# Patient Record
Sex: Male | Born: 1950
Health system: Southern US, Community
[De-identification: ages and names within clinical notes are randomized; demographics above are authoritative.]

## PROBLEM LIST (undated history)

## (undated) DIAGNOSIS — F32A Depression, unspecified: Secondary | ICD-10-CM

## (undated) DIAGNOSIS — F329 Major depressive disorder, single episode, unspecified: Secondary | ICD-10-CM

## (undated) DIAGNOSIS — E785 Hyperlipidemia, unspecified: Secondary | ICD-10-CM

## (undated) DIAGNOSIS — G4700 Insomnia, unspecified: Secondary | ICD-10-CM

## (undated) DIAGNOSIS — K219 Gastro-esophageal reflux disease without esophagitis: Secondary | ICD-10-CM

## (undated) DIAGNOSIS — I10 Essential (primary) hypertension: Secondary | ICD-10-CM

## (undated) DIAGNOSIS — I639 Cerebral infarction, unspecified: Secondary | ICD-10-CM

## (undated) HISTORY — DX: Essential (primary) hypertension: I10

## (undated) HISTORY — DX: Major depressive disorder, single episode, unspecified: F32.9

## (undated) HISTORY — DX: Hyperlipidemia, unspecified: E78.5

## (undated) HISTORY — DX: Gastro-esophageal reflux disease without esophagitis: K21.9

## (undated) HISTORY — PX: LEG SURGERY: SHX1003

## (undated) HISTORY — DX: Depression, unspecified: F32.A

## (undated) HISTORY — DX: Insomnia, unspecified: G47.00

## (undated) HISTORY — DX: Cerebral infarction, unspecified: I63.9

## (undated) HISTORY — PX: OTHER SURGICAL HISTORY: SHX169

---

## 1998-10-05 ENCOUNTER — Ambulatory Visit (HOSPITAL_COMMUNITY): Admission: RE | Admit: 1998-10-05 | Discharge: 1998-10-05 | Payer: Self-pay | Admitting: Internal Medicine

## 1998-10-07 ENCOUNTER — Ambulatory Visit (HOSPITAL_COMMUNITY): Admission: RE | Admit: 1998-10-07 | Discharge: 1998-10-07 | Payer: Self-pay | Admitting: Family Medicine

## 2000-04-02 ENCOUNTER — Ambulatory Visit (HOSPITAL_COMMUNITY): Admission: RE | Admit: 2000-04-02 | Discharge: 2000-04-02 | Payer: Self-pay | Admitting: Internal Medicine

## 2000-06-17 ENCOUNTER — Encounter: Payer: Self-pay | Admitting: Occupational Medicine

## 2000-06-17 ENCOUNTER — Ambulatory Visit (HOSPITAL_COMMUNITY): Admission: RE | Admit: 2000-06-17 | Discharge: 2000-06-17 | Payer: Self-pay | Admitting: Occupational Medicine

## 2003-04-15 ENCOUNTER — Encounter: Admission: RE | Admit: 2003-04-15 | Discharge: 2003-04-15 | Payer: Self-pay | Admitting: Internal Medicine

## 2003-04-15 ENCOUNTER — Encounter: Payer: Self-pay | Admitting: Internal Medicine

## 2003-11-08 ENCOUNTER — Encounter: Admission: RE | Admit: 2003-11-08 | Discharge: 2003-11-08 | Payer: Self-pay | Admitting: Internal Medicine

## 2003-12-09 ENCOUNTER — Ambulatory Visit (HOSPITAL_COMMUNITY): Admission: RE | Admit: 2003-12-09 | Discharge: 2003-12-09 | Payer: Self-pay | Admitting: Internal Medicine

## 2003-12-21 ENCOUNTER — Encounter: Admission: RE | Admit: 2003-12-21 | Discharge: 2003-12-21 | Payer: Self-pay

## 2004-03-30 ENCOUNTER — Encounter: Admission: RE | Admit: 2004-03-30 | Discharge: 2004-03-30 | Payer: Self-pay | Admitting: Internal Medicine

## 2004-04-03 ENCOUNTER — Encounter: Admission: RE | Admit: 2004-04-03 | Discharge: 2004-04-03 | Payer: Self-pay | Admitting: Internal Medicine

## 2004-05-02 ENCOUNTER — Ambulatory Visit (HOSPITAL_COMMUNITY): Admission: RE | Admit: 2004-05-02 | Discharge: 2004-05-02 | Payer: Self-pay | Admitting: *Deleted

## 2004-05-02 ENCOUNTER — Encounter (INDEPENDENT_AMBULATORY_CARE_PROVIDER_SITE_OTHER): Payer: Self-pay | Admitting: Specialist

## 2004-10-25 IMAGING — CT CT PELVIS W/ CM
1 series · 16 of 32 positions shown, 20 images · IV contrast (G+ & 50 MLOMNI 300)
Comparison: none

CLINICAL DATA: Pain in the left femoral canal region.
 CT PELVIS WITH CONTRAST
 Multidetector helical scans through the pelvis were performed after oral and IV contrast media were given.  50 cc of Omnipaque 300 were given as the contrast media.
 The ureters are normal in caliber.  No ureteral calculi are seen.  The urinary bladder is urine distended and unremarkable.  The prostate is minimally prominent with a prostatic calculus present. There is no evidence of hernia and no groin mass is seen.
 IMPRESSION
 Negative CT of the pelvis.

[Series 2: routine pelvis · axial · 0.70mm/px · z∈[-275,-45]mm · 16 of 87 slices shown, 20 images]
[im 6/87  soft-tissue]
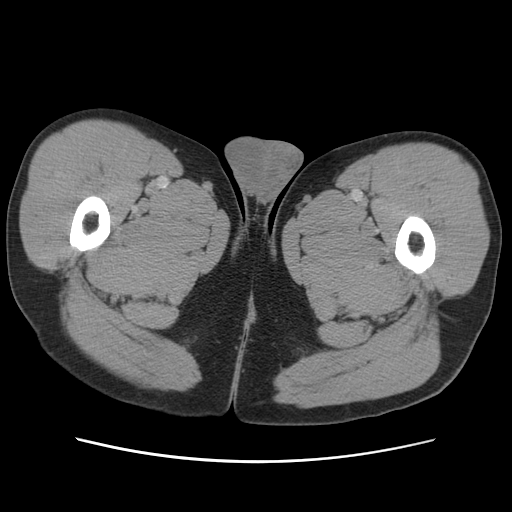
[im 6/87  bone]
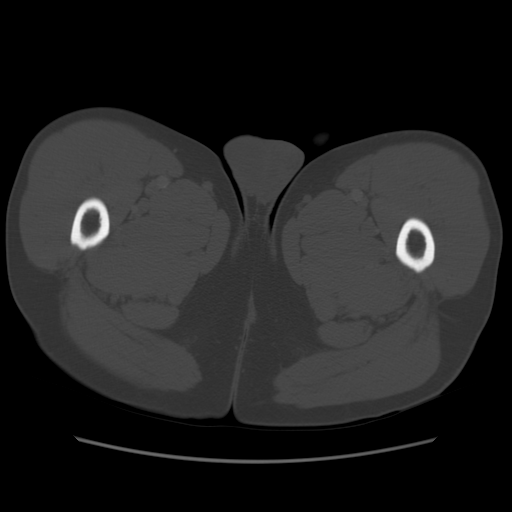
[im 12/87  soft-tissue]
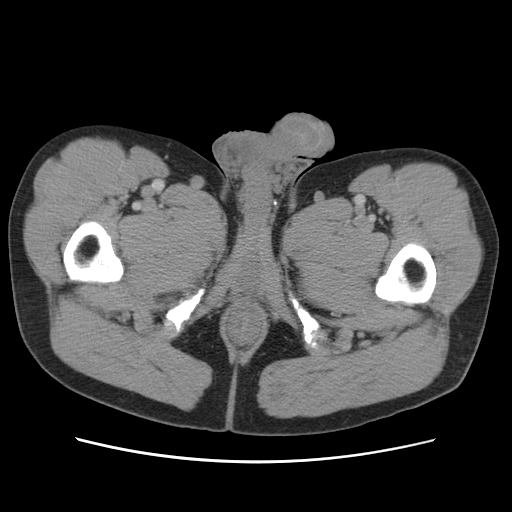
[im 17/87  soft-tissue]
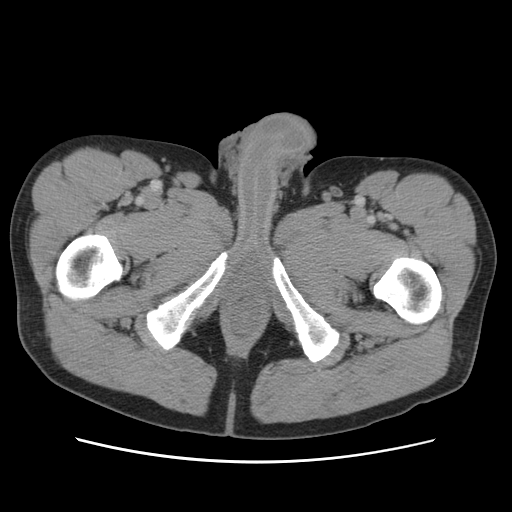
[im 23/87  soft-tissue]
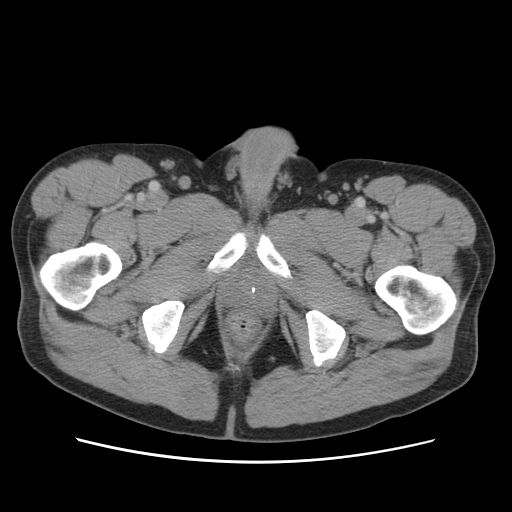
[im 28/87  soft-tissue]
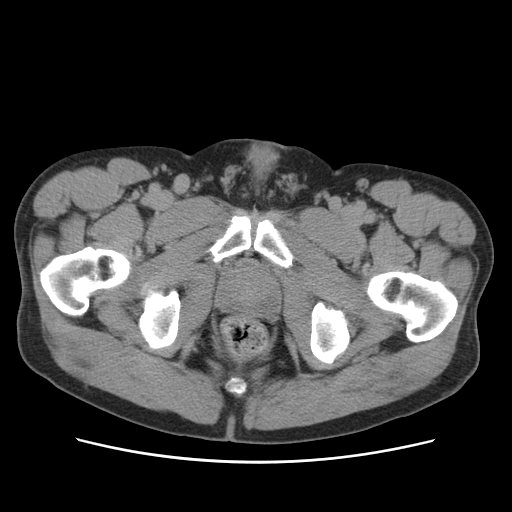
[im 34/87  soft-tissue]
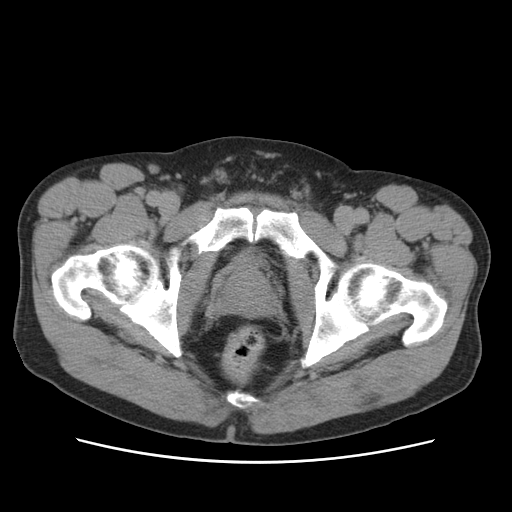
[im 39/87  soft-tissue]
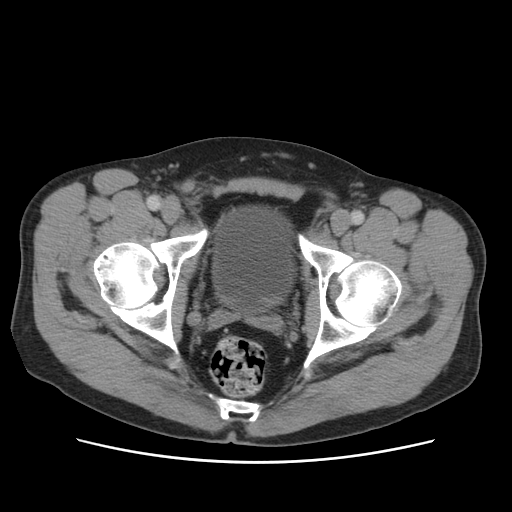
[im 48/87  soft-tissue]
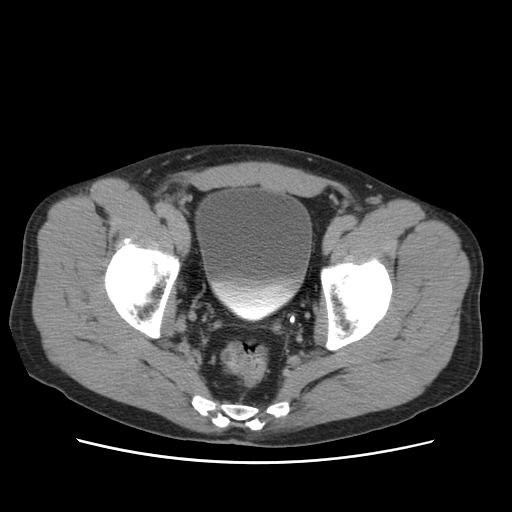
[im 53/87  soft-tissue]
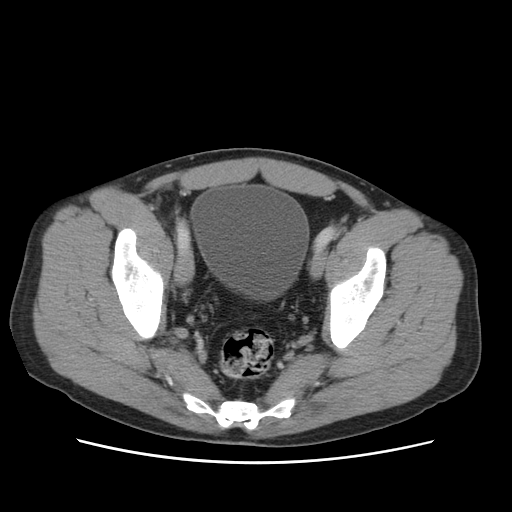
[im 53/87  bone]
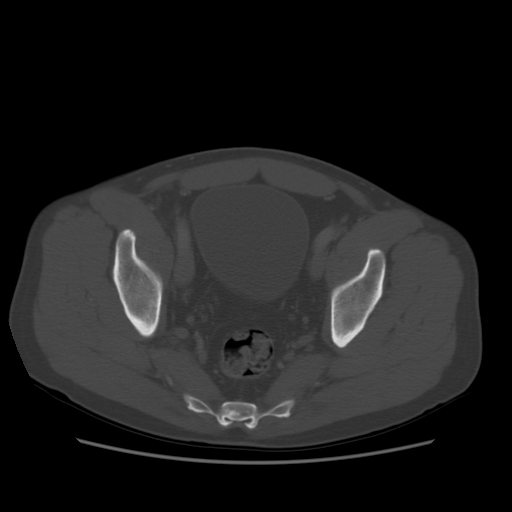
[im 59/87  soft-tissue]
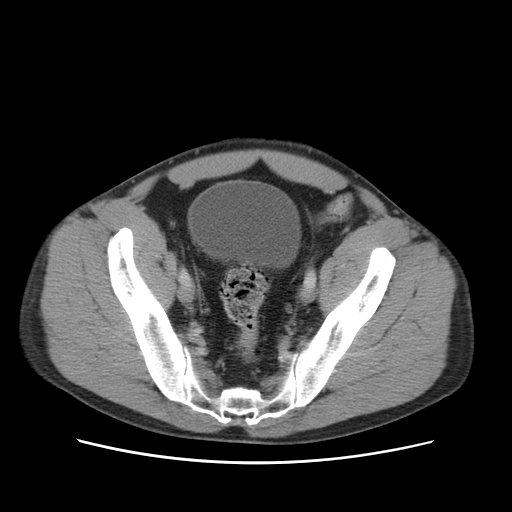
[im 64/87  soft-tissue]
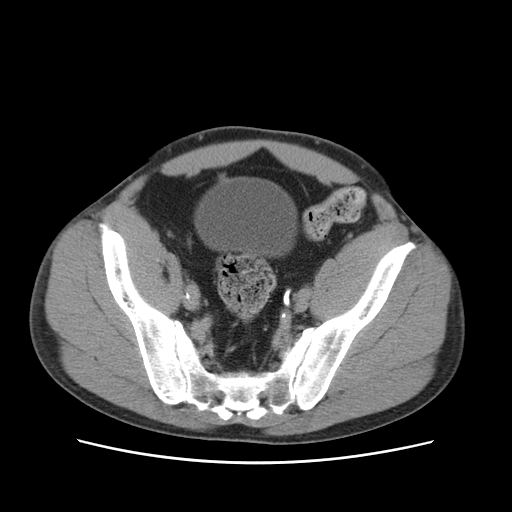
[im 70/87  soft-tissue]
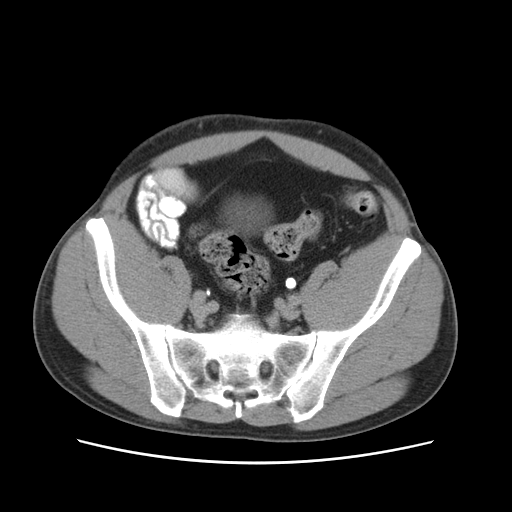
[im 75/87  soft-tissue]
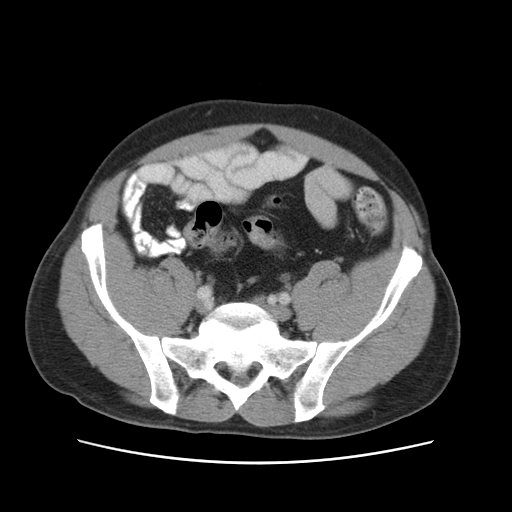
[im 75/87  lung]
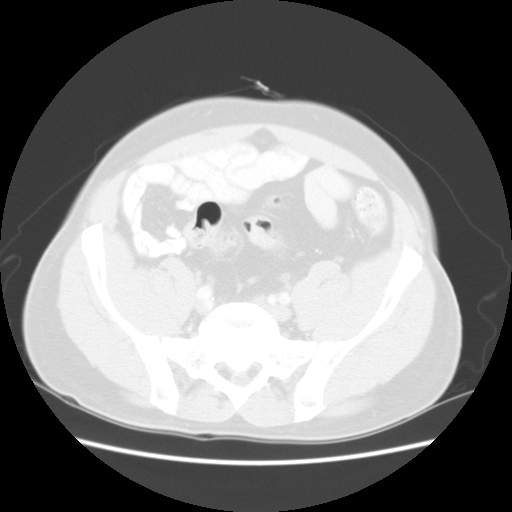
[im 78/87  lung]
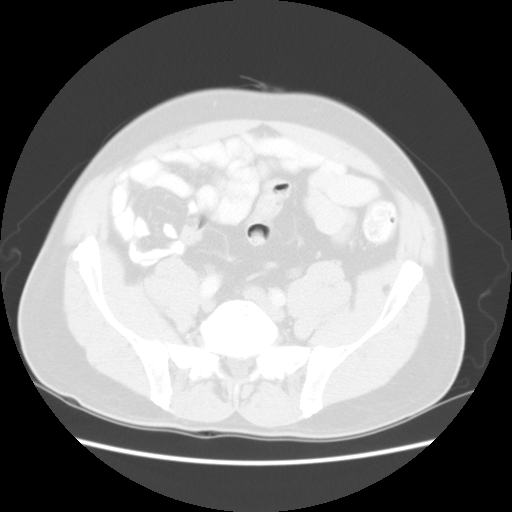
[im 81/87  soft-tissue]
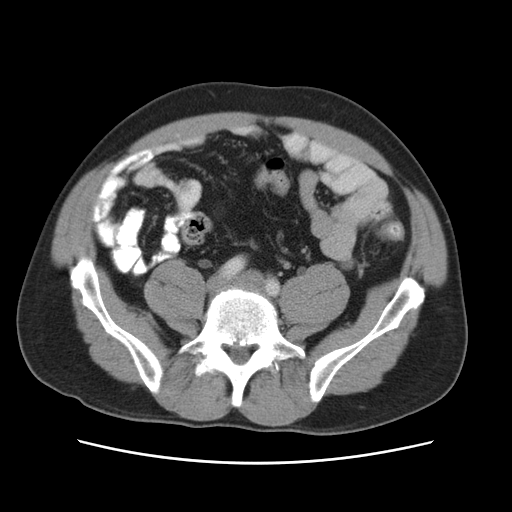
[im 81/87  lung]
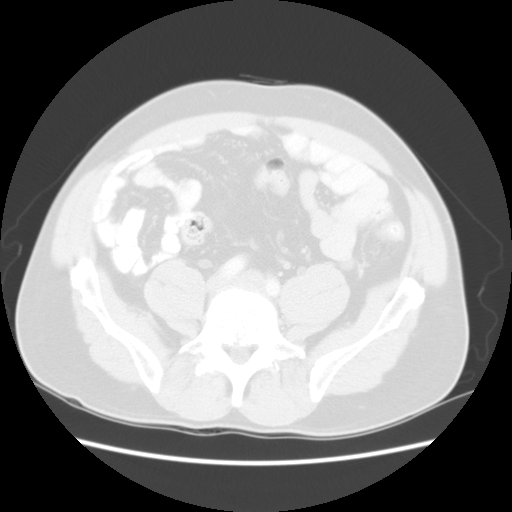
[im 84/87  lung]
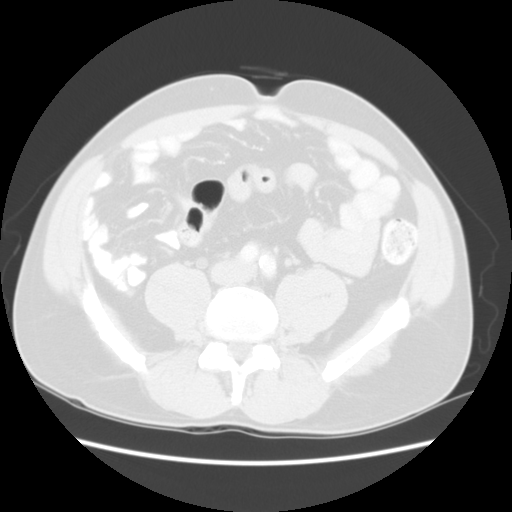

[16 of 32 positions shown; findings below may reference images not displayed]

## 2007-08-28 ENCOUNTER — Emergency Department (HOSPITAL_COMMUNITY): Admission: EM | Admit: 2007-08-28 | Discharge: 2007-08-28 | Payer: Self-pay | Admitting: Emergency Medicine

## 2008-01-06 ENCOUNTER — Emergency Department (HOSPITAL_COMMUNITY): Admission: EM | Admit: 2008-01-06 | Discharge: 2008-01-06 | Payer: Self-pay | Admitting: Emergency Medicine

## 2010-09-05 ENCOUNTER — Ambulatory Visit (HOSPITAL_COMMUNITY): Admission: RE | Admit: 2010-09-05 | Discharge: 2010-09-05 | Payer: Self-pay | Admitting: Orthopedic Surgery

## 2010-09-22 ENCOUNTER — Ambulatory Visit (HOSPITAL_COMMUNITY): Admission: RE | Admit: 2010-09-22 | Discharge: 2010-09-22 | Payer: Self-pay | Admitting: Orthopedic Surgery

## 2010-12-23 ENCOUNTER — Encounter: Payer: Self-pay | Admitting: Internal Medicine

## 2011-02-14 LAB — PROTIME-INR
INR: 1.04 (ref 0.00–1.49)
Prothrombin Time: 13.8 seconds (ref 11.6–15.2)

## 2011-02-14 LAB — CBC
HCT: 47.3 % (ref 39.0–52.0)
Hemoglobin: 16.7 g/dL (ref 13.0–17.0)
MCH: 33.5 pg (ref 26.0–34.0)
MCHC: 35.3 g/dL (ref 30.0–36.0)
MCV: 94.8 fL (ref 78.0–100.0)
Platelets: 244 10*3/uL (ref 150–400)
RBC: 4.99 MIL/uL (ref 4.22–5.81)
RDW: 12.9 % (ref 11.5–15.5)
WBC: 7.3 10*3/uL (ref 4.0–10.5)

## 2011-02-14 LAB — BASIC METABOLIC PANEL
BUN: 17 mg/dL (ref 6–23)
CO2: 26 mEq/L (ref 19–32)
Calcium: 9 mg/dL (ref 8.4–10.5)
Chloride: 106 mEq/L (ref 96–112)
Creatinine, Ser: 1.2 mg/dL (ref 0.4–1.5)
GFR calc Af Amer: >60 mL/min (ref >60)
GFR calc non Af Amer: >60 mL/min (ref >60)
Glucose, Bld: 109 mg/dL — ABNORMAL HIGH (ref 70–99)
Potassium: 4.5 mEq/L (ref 3.5–5.1)
Sodium: 137 mEq/L (ref 135–145)

## 2011-02-14 LAB — SURGICAL PCR SCREEN
MRSA, PCR: NEGATIVE
Staphylococcus aureus: NEGATIVE

## 2011-04-20 NOTE — Op Note (Signed)
NAME:  Johnathan [name], Johnathan [name] NO.:  [id number]   MEDICAL RECORD NO.:  [id number]                   PATIENT TYPE:  OIB   LOCATION:  2899                                 FACILITY:  [facility]   PHYSICIAN:  Balinda Quails, M.D.                 DATE OF BIRTH:  09-Dec-1950   DATE OF PROCEDURE:  05/02/2004  DATE OF DISCHARGE:                                 OPERATIVE REPORT   SURGEON:  Balinda Quails, M.D.   ASSISTANT:  Nurse.   ANESTHESIA:  General endotracheal.  Anesthesiologist; Maren Beach, M.D.   PREOPERATIVE DIAGNOSIS:  1. 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  2. Recurrent superficial thrombophlebitis right leg.   POSTOPERATIVE DIAGNOSIS:  1. 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  2. Recurrent superficial thrombophlebitis right leg.   PROCEDURE:  1. Ligation and stripping of right greater saphenous vein.  2. Ligation and stripping of multiple varicose venous tributaries right     lower extremity.   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  He developed right superficial thrombophlebitis.  This has been  treated with anti-inflammatories.  He has fairly extensive varicosities.  Brought to the operating room at this time for ligation and stripping.   DESCRIPTION OF PROCEDURE:  The patient was brought to the operating room in  stable condition.  Placed in the supine position.  General endotracheal  anesthesia induced.  Right leg prepped and draped in the usual sterile  fashion.   Oblique skin incision was made in the right groin.  Dissection carried down  to expose the saphenofemoral junction.  Tributaries of the saphenous vein  ligated with 3-0 silk and divided.  The saphenofemoral junction exposed.  The saphenous vein ligated at the saphenofemoral junction with an 0 silk  tie.   The  second skin incision then made in the distal thigh over the greater  saphenous vein.  The vein exposed and mobilized, ligated distally with 2-0  silk.  The vein opened with an 11 blade.  The stripper passed up the course  of the vein up to the saphenofemoral junction.  The vein then divided  proximally and distally.  The vein stripped throughout the length of the  thigh.   A transverse skin incision was made over the large varicosity at the knee  level.  This is dissected down to expose extensive thrombosed varicosities  which were excised from the subcutaneous tissue.   Several transverse stepladder skin incisions were then made throughout  premarked areas in the right leg and small tributaries stripped and ligated  with 3-0 silk.   No apparent complications.  The groin incision closed with running 3-0  suture in the subcutaneous layer, running 4-0 Vicryl suture for the skin.  The stepladder incisions were  closed with interrupted 4-0 subcuticular  Vicryl.  The large transverse incision over the varicosity was closed with  interrupted 4-0 nylon suture.  4x4's, Curlex, and Ace wrap were applied.  No  apparent complications.  The patient transferred to the recovery room in  stable condition.                                               Balinda Quails, M.D.    PGH/MEDQ  D:  05/02/2004  T:  05/02/2004  Job:  [id number]

## 2011-07-25 IMAGING — CR DG CHEST 2V
2 series · 2 of 2 positions shown · non-contrast
Comparison: 01/06/2008

CLINICAL DATA: Preadmission

CHEST - 2 VIEW

[view not recorded (1 of 2)]
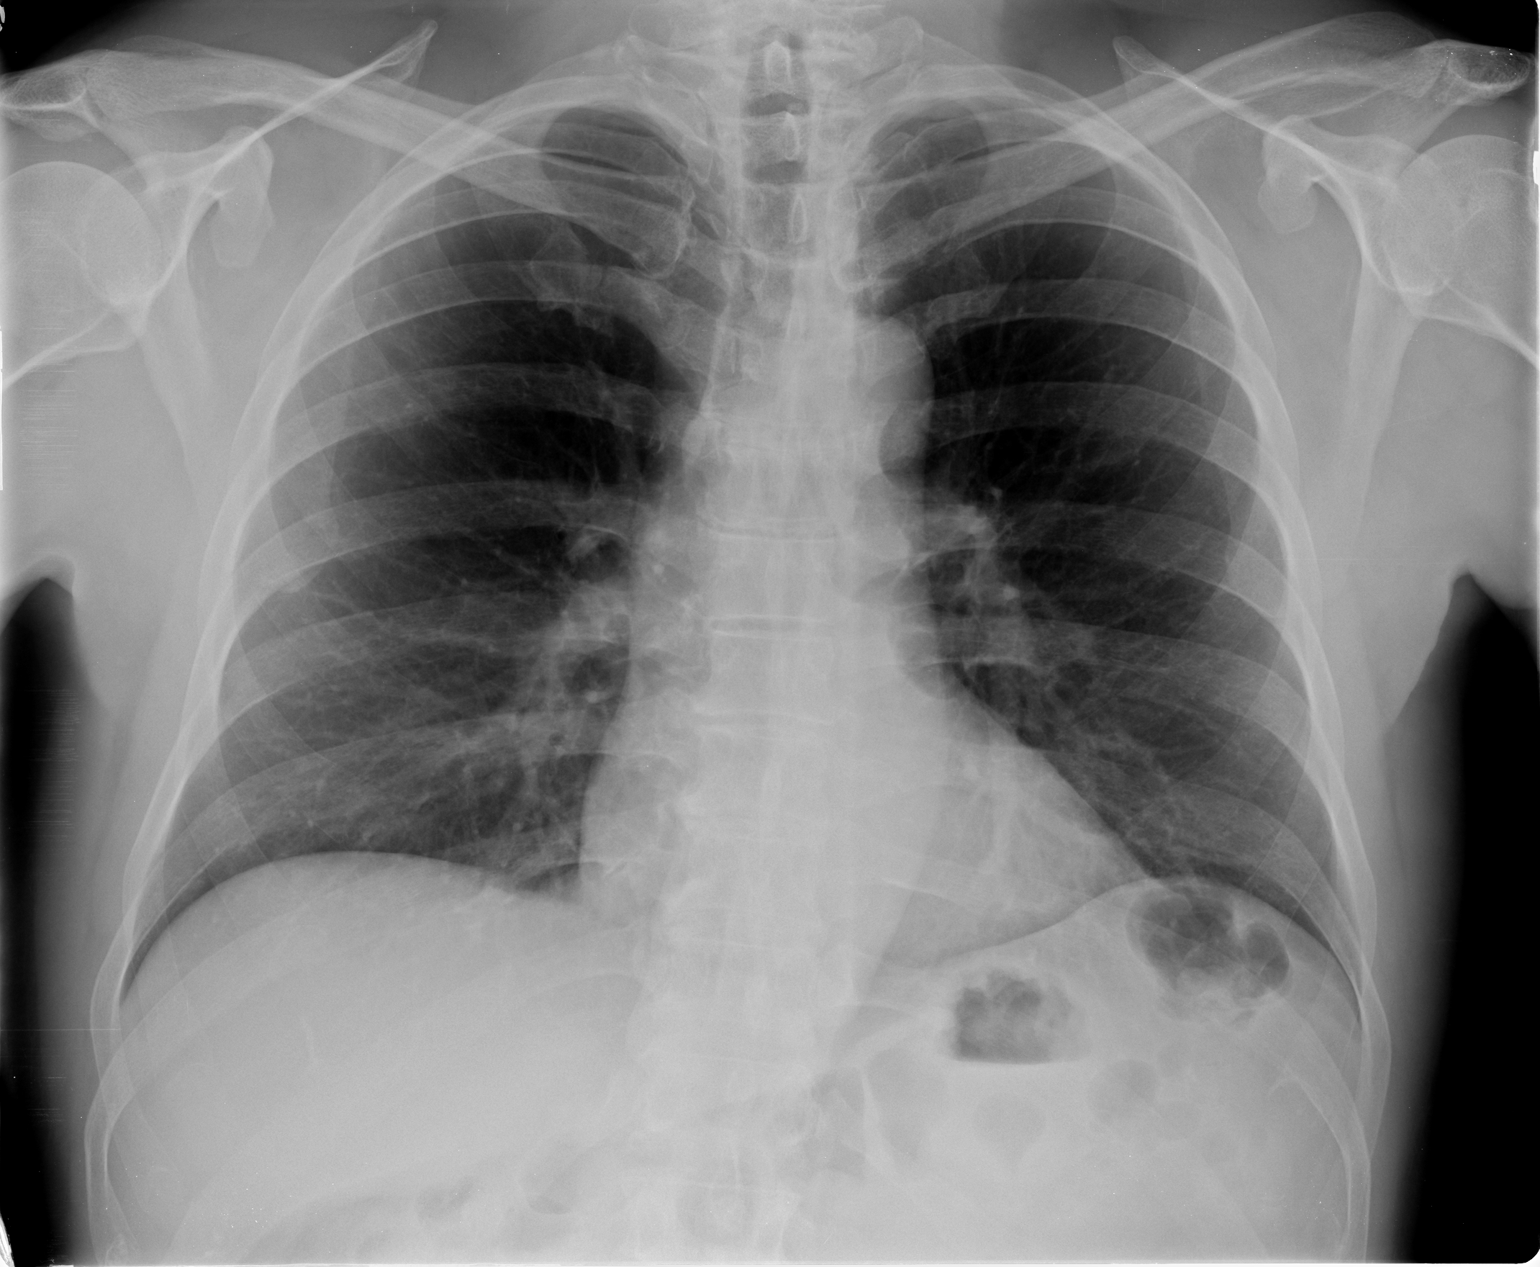

[view not recorded (2 of 2)]
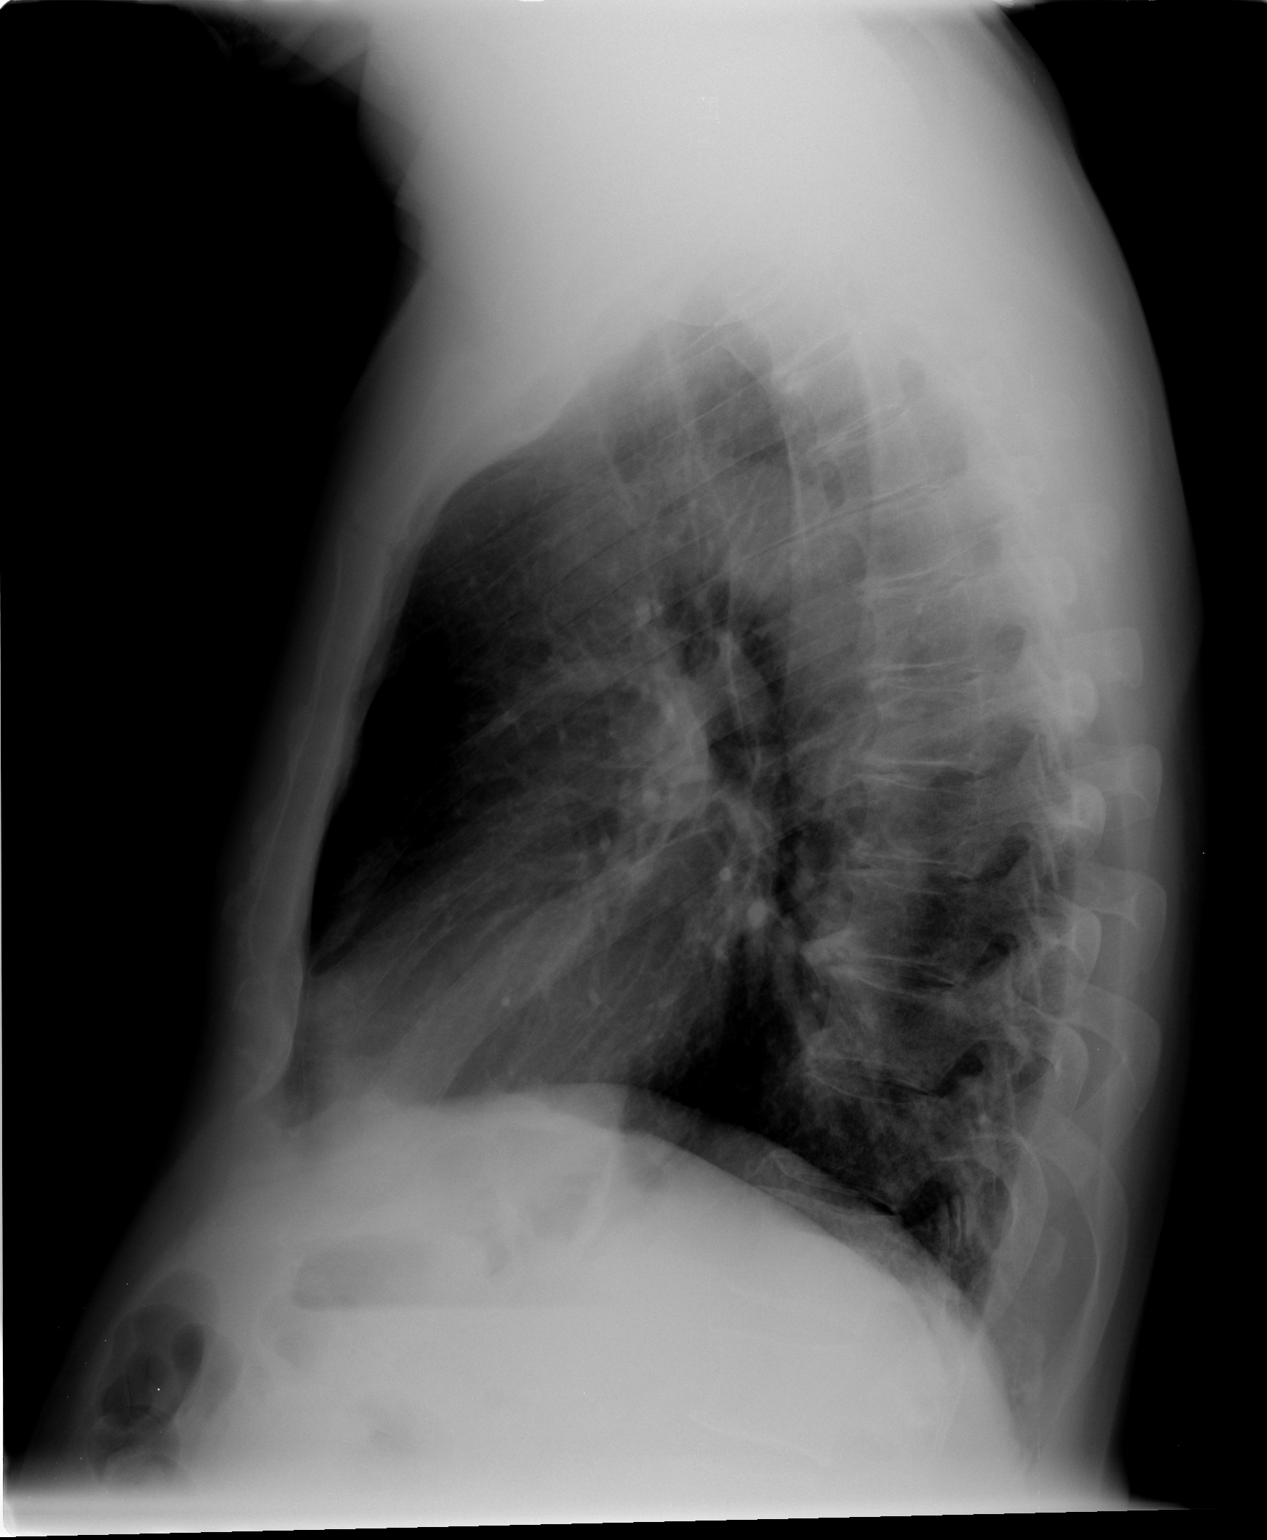

[2 of 2 positions shown; findings below may reference images not displayed]

FINDINGS: Cardiomediastinal silhouette is stable.  No acute
infiltrate or pleural effusion.  No pulmonary edema.  Mild
generative changes thoracic spine.
IMPRESSION: No active disease.  Mild degenerative changes thoracic spine.

## 2012-10-18 ENCOUNTER — Emergency Department (HOSPITAL_COMMUNITY): Payer: 59

## 2012-10-18 ENCOUNTER — Emergency Department (HOSPITAL_COMMUNITY)
Admission: EM | Admit: 2012-10-18 | Discharge: 2012-10-18 | Disposition: A | Discharge status: Home / Self Care | Payer: 59 | Attending: Emergency Medicine | Admitting: Emergency Medicine

## 2012-10-18 ENCOUNTER — Encounter (HOSPITAL_COMMUNITY): Payer: Self-pay | Admitting: Family Medicine

## 2012-10-18 DIAGNOSIS — Y93K1 Activity, walking an animal: Secondary | ICD-10-CM | POA: Insufficient documentation

## 2012-10-18 DIAGNOSIS — S5290XA Unspecified fracture of unspecified forearm, initial encounter for closed fracture: Secondary | ICD-10-CM | POA: Insufficient documentation

## 2012-10-18 DIAGNOSIS — S4992XA Unspecified injury of left shoulder and upper arm, initial encounter: Secondary | ICD-10-CM

## 2012-10-18 DIAGNOSIS — Y929 Unspecified place or not applicable: Secondary | ICD-10-CM | POA: Insufficient documentation

## 2012-10-18 DIAGNOSIS — S43016A Anterior dislocation of unspecified humerus, initial encounter: Secondary | ICD-10-CM

## 2012-10-18 DIAGNOSIS — R209 Unspecified disturbances of skin sensation: Secondary | ICD-10-CM | POA: Insufficient documentation

## 2012-10-18 DIAGNOSIS — W010XXA Fall on same level from slipping, tripping and stumbling without subsequent striking against object, initial encounter: Secondary | ICD-10-CM | POA: Insufficient documentation

## 2012-10-18 DIAGNOSIS — F17200 Nicotine dependence, unspecified, uncomplicated: Secondary | ICD-10-CM | POA: Insufficient documentation

## 2012-10-18 MED ORDER — SODIUM CHLORIDE 0.9 % IV SOLN
Freq: Once | INTRAVENOUS | Status: AC
  Administered 2012-10-18: 20 mL/h via INTRAVENOUS

## 2012-10-18 MED ORDER — HYDROCODONE-ACETAMINOPHEN 5-500 MG PO TABS: 1.0000 | ORAL_TABLET | Freq: Four times a day (QID) | ORAL | Status: DC | PRN

## 2012-10-18 MED ORDER — FENTANYL CITRATE 0.05 MG/ML IJ SOLN
50.0000 ug | Freq: Once | INTRAMUSCULAR | Status: AC
  Administered 2012-10-18: 50 ug via INTRAVENOUS
  Filled 2012-10-18: qty 2

## 2012-10-18 MED ORDER — HYDROMORPHONE HCL PF 1 MG/ML IJ SOLN
1.0000 mg | Freq: Once | INTRAMUSCULAR | Status: AC
  Administered 2012-10-18: 1 mg via INTRAVENOUS
  Filled 2012-10-18: qty 1

## 2012-10-18 NOTE — ED Provider Notes (Signed)
History     CSN: [id number]  Arrival date & time 10/18/12  0612   First MD Initiated Contact with Patient 10/18/12 [phone]      Chief Complaint  Patient presents with  . Shoulder Injury    (Consider location/radiation/quality/duration/timing/severity/associated sxs/prior treatment) HPI Comments: Patient presents with complaint of left shoulder injury. Patient states he tripped and fell while walking dog. Patient states that his pain is 8/10 without radiation. Associated ome numbness and tingling. No other injuries. No head trauma or LOC. Prior dislocation 25 years ago.   The history is provided by the patient. No language interpreter was used.    History reviewed. No pertinent past medical history.  Past Surgical History  Procedure Date  . Leg surgery     No family history on file.  History  Substance Use Topics  . Smoking status: Current Every Day Smoker -- 1.0 packs/day    Types: Cigarettes  . Smokeless tobacco: Not on file  . Alcohol Use: No      Review of Systems  Musculoskeletal: Positive for arthralgias.  Neurological: Positive for numbness.    Allergies  Review of patient's allergies indicates no known allergies.  Home Medications  No current outpatient prescriptions on file.  BP 116/76  Pulse 68  Temp 97.7 F (36.5 C) (Oral)  Resp 18  SpO2 96%  Physical Exam  Nursing note and vitals reviewed. Constitutional: He appears well-developed and well-nourished.  HENT:  Head: Normocephalic and atraumatic.  Mouth/Throat: Oropharynx is clear and moist.  Eyes: Conjunctivae normal and EOM are normal. No scleral icterus.  Neck: Normal range of motion. Neck supple.  Cardiovascular: Normal rate, regular rhythm and normal heart sounds.   Pulmonary/Chest: Effort normal and breath sounds normal.  Abdominal: Soft. Bowel sounds are normal. There is no tenderness.  Musculoskeletal: He exhibits tenderness. He exhibits no edema.       Tenderness to palpation of the  bicep. Obvious deformity of the left shoulder. Patient unable abduct. Strong radial pulse, good cap refill, good sensation.  Neurological: He is alert.  Skin: Skin is warm and dry.    ED Course  Procedures (including critical care time)  Labs Reviewed - No data to display Dg Shoulder Left  10/18/2012  *RADIOLOGY REPORT*  Clinical Data: Fall.  History of previous shoulder dislocations.  LEFT SHOULDER - 2+ VIEW  Comparison: None.  Findings: There is anterior, inferior dislocation of the humeral head, which abuts the anterior inferior glenoid.  No fracture is seen.  [facility] joint is normally aligned.  The underlying ribs intact.  IMPRESSION: Anterior inferior left shoulder dislocation.  No fracture.   Original Report Authenticated By: Amie Portland, M.D.    Dg Shoulder Left Port  10/18/2012  *RADIOLOGY REPORT*  Clinical Data: Post reduction radiographs.  Shoulder dislocation.  PORTABLE LEFT SHOULDER - 2+ VIEW  Comparison: 10/18/2012. 0658 hours.  Findings: The anterior shoulder dislocation has been reduced. There is a large Hill-Sachs fracture.  Small bone fragment is present in the axillary pouch measuring 5 mm.  May this probably represents a fragment from the Hill-Sachs fracture rather than a displaced bony Bankart however it could represent either.  IMPRESSION: Reduction of anterior shoulder dislocation.  Small fracture fragment in the axillary pouch.  Large Hill-Sachs fracture.   Original Report Authenticated By: Andreas Newport, M.D.      1. Anterior shoulder dislocation   2. Injury of left shoulder       MDM  Patient presented with left shoulder dislocation.  Patient given dilaudid and shoulder reduced by Dr. Denton Lank. Repeat imaging remarkable for successfully reduced shoulder. Patient placed in shoulder immobilizer with instructions to wear for one week and then follow-up with ortho on call. Discharged with a short course of pain medication and return precautions. Return precautions given.  No red flags for fracture.        Pixie Casino, PA-C 10/18/12 [phone]

## 2012-10-18 NOTE — ED Notes (Signed)
Patient states he fell around 4:30am injurying his left shoulder. States he has dislocated same shoulder in the past.

## 2012-10-18 NOTE — ED Notes (Signed)
MD at bedside. 

## 2012-10-18 NOTE — ED Notes (Signed)
Patient transported to X-ray 

## 2012-10-18 NOTE — ED Notes (Signed)
Deformity to left shoulder

## 2012-10-19 NOTE — ED Provider Notes (Signed)
Medical screening examination/treatment/procedure(s) were conducted as a shared visit with non-physician practitioner(s) and myself.  I personally evaluated the patient during the encounter S/p trip and fall, left shoulder pain. Dislocation on exam/xr. Dilaudid for pain.  Left shoulder reduction - initial attempt using Cunningham technique not successful. Pt laid supine, left arm slowly raised/extended above head, gentle traction, reduction of shoulder. Shoulder immobilizer. Recheck pain improved. Radial pulse 2+. R/u/m nerve fxn intact. Ortho f/u.    Suzi Roots, MD 10/19/12 [phone]

## 2016-05-12 ENCOUNTER — Inpatient Hospital Stay (HOSPITAL_COMMUNITY)
Admission: EM | Admit: 2016-05-12 | Discharge: 2016-05-16 | DRG: 034 | Disposition: A | Discharge status: Rehabilitation Facility | Payer: 59 | Attending: Neurology | Admitting: Neurology

## 2016-05-12 ENCOUNTER — Emergency Department (HOSPITAL_COMMUNITY): Payer: 59

## 2016-05-12 ENCOUNTER — Encounter (HOSPITAL_COMMUNITY): Payer: Self-pay

## 2016-05-12 ENCOUNTER — Inpatient Hospital Stay (HOSPITAL_COMMUNITY): Payer: 59

## 2016-05-12 ENCOUNTER — Inpatient Hospital Stay (HOSPITAL_COMMUNITY): Payer: 59 | Admitting: Anesthesiology

## 2016-05-12 ENCOUNTER — Encounter (HOSPITAL_COMMUNITY)
Admission: EM | Disposition: A | Discharge status: Rehabilitation Facility | Payer: Self-pay | Source: Home / Self Care | Attending: Neurology

## 2016-05-12 DIAGNOSIS — N189 Chronic kidney disease, unspecified: Secondary | ICD-10-CM | POA: Diagnosis present

## 2016-05-12 DIAGNOSIS — E785 Hyperlipidemia, unspecified: Secondary | ICD-10-CM | POA: Diagnosis present

## 2016-05-12 DIAGNOSIS — R402252 Coma scale, best verbal response, oriented, at arrival to emergency department: Secondary | ICD-10-CM | POA: Diagnosis present

## 2016-05-12 DIAGNOSIS — I63411 Cerebral infarction due to embolism of right middle cerebral artery: Secondary | ICD-10-CM | POA: Diagnosis present

## 2016-05-12 DIAGNOSIS — K5900 Constipation, unspecified: Secondary | ICD-10-CM | POA: Diagnosis not present

## 2016-05-12 DIAGNOSIS — I6601 Occlusion and stenosis of right middle cerebral artery: Secondary | ICD-10-CM | POA: Diagnosis not present

## 2016-05-12 DIAGNOSIS — G459 Transient cerebral ischemic attack, unspecified: Secondary | ICD-10-CM | POA: Diagnosis not present

## 2016-05-12 DIAGNOSIS — K219 Gastro-esophageal reflux disease without esophagitis: Secondary | ICD-10-CM | POA: Diagnosis present

## 2016-05-12 DIAGNOSIS — Z4659 Encounter for fitting and adjustment of other gastrointestinal appliance and device: Secondary | ICD-10-CM

## 2016-05-12 DIAGNOSIS — I63031 Cerebral infarction due to thrombosis of right carotid artery: Principal | ICD-10-CM | POA: Diagnosis present

## 2016-05-12 DIAGNOSIS — R402142 Coma scale, eyes open, spontaneous, at arrival to emergency department: Secondary | ICD-10-CM | POA: Diagnosis present

## 2016-05-12 DIAGNOSIS — I63032 Cerebral infarction due to thrombosis of left carotid artery: Secondary | ICD-10-CM

## 2016-05-12 DIAGNOSIS — E663 Overweight: Secondary | ICD-10-CM | POA: Diagnosis present

## 2016-05-12 DIAGNOSIS — I69354 Hemiplegia and hemiparesis following cerebral infarction affecting left non-dominant side: Secondary | ICD-10-CM | POA: Diagnosis not present

## 2016-05-12 DIAGNOSIS — R414 Neurologic neglect syndrome: Secondary | ICD-10-CM | POA: Diagnosis present

## 2016-05-12 DIAGNOSIS — G9340 Encephalopathy, unspecified: Secondary | ICD-10-CM | POA: Diagnosis not present

## 2016-05-12 DIAGNOSIS — E876 Hypokalemia: Secondary | ICD-10-CM | POA: Diagnosis not present

## 2016-05-12 DIAGNOSIS — R402362 Coma scale, best motor response, obeys commands, at arrival to emergency department: Secondary | ICD-10-CM | POA: Diagnosis present

## 2016-05-12 DIAGNOSIS — Z720 Tobacco use: Secondary | ICD-10-CM | POA: Diagnosis not present

## 2016-05-12 DIAGNOSIS — Q251 Coarctation of aorta: Secondary | ICD-10-CM

## 2016-05-12 DIAGNOSIS — Z823 Family history of stroke: Secondary | ICD-10-CM | POA: Diagnosis not present

## 2016-05-12 DIAGNOSIS — R531 Weakness: Secondary | ICD-10-CM

## 2016-05-12 DIAGNOSIS — I10 Essential (primary) hypertension: Secondary | ICD-10-CM | POA: Diagnosis not present

## 2016-05-12 DIAGNOSIS — G8194 Hemiplegia, unspecified affecting left nondominant side: Secondary | ICD-10-CM | POA: Diagnosis present

## 2016-05-12 DIAGNOSIS — I639 Cerebral infarction, unspecified: Secondary | ICD-10-CM

## 2016-05-12 DIAGNOSIS — I63511 Cerebral infarction due to unspecified occlusion or stenosis of right middle cerebral artery: Secondary | ICD-10-CM | POA: Diagnosis not present

## 2016-05-12 DIAGNOSIS — T508X5A Adverse effect of diagnostic agents, initial encounter: Secondary | ICD-10-CM | POA: Diagnosis not present

## 2016-05-12 DIAGNOSIS — I63311 Cerebral infarction due to thrombosis of right middle cerebral artery: Secondary | ICD-10-CM | POA: Diagnosis not present

## 2016-05-12 DIAGNOSIS — R29710 NIHSS score 10: Secondary | ICD-10-CM | POA: Diagnosis present

## 2016-05-12 DIAGNOSIS — I63231 Cerebral infarction due to unspecified occlusion or stenosis of right carotid arteries: Secondary | ICD-10-CM | POA: Diagnosis not present

## 2016-05-12 DIAGNOSIS — N179 Acute kidney failure, unspecified: Secondary | ICD-10-CM | POA: Diagnosis not present

## 2016-05-12 DIAGNOSIS — Z01818 Encounter for other preprocedural examination: Secondary | ICD-10-CM

## 2016-05-12 DIAGNOSIS — Z6825 Body mass index (BMI) 25.0-25.9, adult: Secondary | ICD-10-CM | POA: Diagnosis not present

## 2016-05-12 DIAGNOSIS — Z79899 Other long term (current) drug therapy: Secondary | ICD-10-CM | POA: Diagnosis not present

## 2016-05-12 DIAGNOSIS — T82898A Other specified complication of vascular prosthetic devices, implants and grafts, initial encounter: Secondary | ICD-10-CM

## 2016-05-12 DIAGNOSIS — J9601 Acute respiratory failure with hypoxia: Secondary | ICD-10-CM | POA: Diagnosis not present

## 2016-05-12 DIAGNOSIS — I69398 Other sequelae of cerebral infarction: Secondary | ICD-10-CM | POA: Diagnosis not present

## 2016-05-12 DIAGNOSIS — F17210 Nicotine dependence, cigarettes, uncomplicated: Secondary | ICD-10-CM | POA: Diagnosis present

## 2016-05-12 DIAGNOSIS — Z95828 Presence of other vascular implants and grafts: Secondary | ICD-10-CM | POA: Diagnosis not present

## 2016-05-12 DIAGNOSIS — N182 Chronic kidney disease, stage 2 (mild): Secondary | ICD-10-CM | POA: Diagnosis not present

## 2016-05-12 DIAGNOSIS — E119 Type 2 diabetes mellitus without complications: Secondary | ICD-10-CM | POA: Diagnosis present

## 2016-05-12 DIAGNOSIS — M6289 Other specified disorders of muscle: Secondary | ICD-10-CM | POA: Diagnosis not present

## 2016-05-12 DIAGNOSIS — I63239 Cerebral infarction due to unspecified occlusion or stenosis of unspecified carotid arteries: Secondary | ICD-10-CM | POA: Diagnosis present

## 2016-05-12 DIAGNOSIS — E860 Dehydration: Secondary | ICD-10-CM | POA: Diagnosis present

## 2016-05-12 DIAGNOSIS — I6350 Cerebral infarction due to unspecified occlusion or stenosis of unspecified cerebral artery: Secondary | ICD-10-CM | POA: Diagnosis not present

## 2016-05-12 DIAGNOSIS — J9600 Acute respiratory failure, unspecified whether with hypoxia or hypercapnia: Secondary | ICD-10-CM | POA: Diagnosis present

## 2016-05-12 DIAGNOSIS — Z459 Encounter for adjustment and management of unspecified implanted device: Secondary | ICD-10-CM | POA: Diagnosis not present

## 2016-05-12 DIAGNOSIS — I672 Cerebral atherosclerosis: Secondary | ICD-10-CM | POA: Diagnosis present

## 2016-05-12 DIAGNOSIS — R55 Syncope and collapse: Secondary | ICD-10-CM | POA: Diagnosis present

## 2016-05-12 DIAGNOSIS — I6789 Other cerebrovascular disease: Secondary | ICD-10-CM | POA: Diagnosis not present

## 2016-05-12 HISTORY — PX: RADIOLOGY WITH ANESTHESIA: SHX6223

## 2016-05-12 LAB — COMPREHENSIVE METABOLIC PANEL
ALT: 22 U/L (ref 17–63)
AST: 19 U/L (ref 15–41)
Albumin: 4.1 g/dL (ref 3.5–5.0)
Alkaline Phosphatase: 67 U/L (ref 38–126)
Anion gap: 7 (ref 5–15)
BUN: 12 mg/dL (ref 6–20)
CO2: 22 mmol/L (ref 22–32)
Calcium: 9.2 mg/dL (ref 8.9–10.3)
Chloride: 110 mmol/L (ref 101–111)
Creatinine, Ser: 1.47 mg/dL — ABNORMAL HIGH (ref 0.61–1.24)
GFR calc Af Amer: 56 mL/min — ABNORMAL LOW (ref >60)
GFR calc non Af Amer: 49 mL/min — ABNORMAL LOW (ref >60)
Glucose, Bld: 147 mg/dL — ABNORMAL HIGH (ref 65–99)
Potassium: 3.9 mmol/L (ref 3.5–5.1)
Sodium: 139 mmol/L (ref 135–145)
Total Bilirubin: 1.1 mg/dL (ref 0.3–1.2)
Total Protein: 6.8 g/dL (ref 6.5–8.1)

## 2016-05-12 LAB — CBG MONITORING, ED: Glucose-Capillary: 174 mg/dL — ABNORMAL HIGH (ref 65–99)

## 2016-05-12 LAB — PROTIME-INR
INR: 1.22 (ref 0.00–1.49)
Prothrombin Time: 15.6 seconds — ABNORMAL HIGH (ref 11.6–15.2)

## 2016-05-12 LAB — I-STAT CHEM 8, ED
BUN: 14 mg/dL (ref 6–20)
Calcium, Ion: 1.08 mmol/L — ABNORMAL LOW (ref 1.13–1.30)
Chloride: 110 mmol/L (ref 101–111)
Creatinine, Ser: 1.4 mg/dL — ABNORMAL HIGH (ref 0.61–1.24)
Glucose, Bld: 146 mg/dL — ABNORMAL HIGH (ref 65–99)
HCT: 48 % (ref 39.0–52.0)
Hemoglobin: 16.3 g/dL (ref 13.0–17.0)
Potassium: 4 mmol/L (ref 3.5–5.1)
Sodium: 137 mmol/L (ref 135–145)
TCO2: 22 mmol/L (ref 0–100)

## 2016-05-12 LAB — DIFFERENTIAL
Basophils Absolute: 0 10*3/uL (ref 0.0–0.1)
Basophils Relative: 0 %
Eosinophils Absolute: 0.2 10*3/uL (ref 0.0–0.7)
Eosinophils Relative: 2 %
Lymphocytes Relative: 13 %
Lymphs Abs: 1.4 10*3/uL (ref 0.7–4.0)
Monocytes Absolute: 0.6 10*3/uL (ref 0.1–1.0)
Monocytes Relative: 5 %
Neutro Abs: 8.5 10*3/uL — ABNORMAL HIGH (ref 1.7–7.7)
Neutrophils Relative %: 80 %

## 2016-05-12 LAB — CBC
HCT: 46.8 % (ref 39.0–52.0)
Hemoglobin: 16 g/dL (ref 13.0–17.0)
MCH: 32.1 pg (ref 26.0–34.0)
MCHC: 34.2 g/dL (ref 30.0–36.0)
MCV: 94 fL (ref 78.0–100.0)
Platelets: 247 10*3/uL (ref 150–400)
RBC: 4.98 MIL/uL (ref 4.22–5.81)
RDW: 12.8 % (ref 11.5–15.5)
WBC: 10.7 10*3/uL — ABNORMAL HIGH (ref 4.0–10.5)

## 2016-05-12 LAB — I-STAT TROPONIN, ED: Troponin i, poc: 0 ng/mL (ref 0.00–0.08)

## 2016-05-12 LAB — APTT: aPTT: 27 seconds (ref 24–37)

## 2016-05-12 SURGERY — RADIOLOGY WITH ANESTHESIA
Anesthesia: General

## 2016-05-12 MED ORDER — PROPOFOL 10 MG/ML IV BOLUS
INTRAVENOUS | Status: AC
  Filled 2016-05-12: qty 20

## 2016-05-12 MED ORDER — PROPOFOL 10 MG/ML IV BOLUS
INTRAVENOUS | Status: DC | PRN
  Administered 2016-05-12: 10 mg via INTRAVENOUS
  Administered 2016-05-12: 120 mg via INTRAVENOUS
  Administered 2016-05-12: 20 mg via INTRAVENOUS

## 2016-05-12 MED ORDER — IOPAMIDOL (ISOVUE-370) INJECTION 76%
INTRAVENOUS | Status: AC
  Filled 2016-05-12: qty 50

## 2016-05-12 MED ORDER — ASPIRIN 325 MG PO TABS
325.0000 mg | ORAL_TABLET | Freq: Once | ORAL | Status: AC
  Administered 2016-05-12: 325 mg via ORAL

## 2016-05-12 MED ORDER — LIDOCAINE HCL 1 % IJ SOLN
INTRAMUSCULAR | Status: DC | PRN
  Administered 2016-05-12: 10 mL

## 2016-05-12 MED ORDER — LIDOCAINE HCL (CARDIAC) 20 MG/ML IV SOLN
INTRAVENOUS | Status: DC | PRN
  Administered 2016-05-12: 100 mg via INTRAVENOUS

## 2016-05-12 MED ORDER — DEXTROSE 5 % IV SOLN
10.0000 mg | INTRAVENOUS | Status: DC | PRN
  Administered 2016-05-12: 40 ug/min via INTRAVENOUS

## 2016-05-12 MED ORDER — MIDAZOLAM HCL 2 MG/2ML IJ SOLN
INTRAMUSCULAR | Status: AC
  Administered 2016-05-13: 2 mg via INTRAVENOUS
  Filled 2016-05-12: qty 2

## 2016-05-12 MED ORDER — ABCIXIMAB 2 MG/ML IV SOLN
5.0000 mg | INTRAVENOUS | Status: AC
  Administered 2016-05-12: 5 mg via INTRAVENOUS
  Filled 2016-05-12: qty 5

## 2016-05-12 MED ORDER — IOPAMIDOL (ISOVUE-300) INJECTION 61%
INTRAVENOUS | Status: AC
  Administered 2016-05-12: 100 mL
  Filled 2016-05-12: qty 150

## 2016-05-12 MED ORDER — CLOPIDOGREL BISULFATE 300 MG PO TABS
300.0000 mg | ORAL_TABLET | Freq: Once | ORAL | Status: AC
Start: 2016-05-12 — End: 2016-05-12
  Administered 2016-05-12: 300 mg via ORAL

## 2016-05-12 MED ORDER — ASPIRIN 325 MG PO TABS
ORAL_TABLET | ORAL | Status: AC
  Administered 2016-05-12: 325 mg via ORAL
  Filled 2016-05-12: qty 1

## 2016-05-12 MED ORDER — NITROGLYCERIN 1 MG/10 ML FOR IR/CATH LAB
INTRA_ARTERIAL | Status: AC
  Administered 2016-05-12 – 2016-05-13 (×2): 25 ug
  Filled 2016-05-12: qty 10

## 2016-05-12 MED ORDER — LIDOCAINE HCL 1 % IJ SOLN
INTRAMUSCULAR | Status: AC
  Filled 2016-05-12: qty 20

## 2016-05-12 MED ORDER — ROCURONIUM BROMIDE 100 MG/10ML IV SOLN
INTRAVENOUS | Status: DC | PRN
  Administered 2016-05-12 (×2): 50 mg via INTRAVENOUS

## 2016-05-12 MED ORDER — ALTEPLASE (STROKE) FULL DOSE INFUSION
72.0000 mg | Freq: Once | INTRAVENOUS | Status: AC
  Administered 2016-05-12: 72 mg via INTRAVENOUS
  Filled 2016-05-12: qty 100

## 2016-05-12 MED ORDER — FENTANYL CITRATE (PF) 100 MCG/2ML IJ SOLN
INTRAMUSCULAR | Status: DC | PRN
  Administered 2016-05-12: 200 ug via INTRAVENOUS
  Administered 2016-05-12: 50 ug via INTRAVENOUS

## 2016-05-12 MED ORDER — SUCCINYLCHOLINE CHLORIDE 20 MG/ML IJ SOLN
INTRAMUSCULAR | Status: DC | PRN
  Administered 2016-05-12: 140 mg via INTRAVENOUS

## 2016-05-12 MED ORDER — FENTANYL CITRATE (PF) 250 MCG/5ML IJ SOLN
INTRAMUSCULAR | Status: AC
  Filled 2016-05-12: qty 5

## 2016-05-12 MED ORDER — ALTEPLASE 30 MG/30 ML FOR INTERV. RAD
1.0000 mg | INTRA_ARTERIAL | Status: AC | PRN
Start: 2016-05-12 — End: 2016-05-12
  Administered 2016-05-12: 5 mg via INTRA_ARTERIAL
  Filled 2016-05-12: qty 30

## 2016-05-12 MED ORDER — LACTATED RINGERS IV SOLN
INTRAVENOUS | Status: DC | PRN
  Administered 2016-05-12 (×2): via INTRAVENOUS

## 2016-05-12 MED ORDER — EPTIFIBATIDE 20 MG/10ML IV SOLN
INTRAVENOUS | Status: AC
  Administered 2016-05-12: 6 mg
  Administered 2016-05-12: 14 mg
  Filled 2016-05-12: qty 10

## 2016-05-12 MED ORDER — CLOPIDOGREL BISULFATE 75 MG PO TABS
ORAL_TABLET | ORAL | Status: AC
  Administered 2016-05-12: 300 mg via ORAL
  Filled 2016-05-12: qty 4

## 2016-05-12 MED ORDER — LABETALOL HCL 5 MG/ML IV SOLN
10.0000 mg | INTRAVENOUS | Status: DC | PRN
Start: 2016-05-12 — End: 2016-05-16
  Administered 2016-05-12: 10 mg via INTRAVENOUS
  Filled 2016-05-12: qty 4

## 2016-05-12 MED ORDER — EPHEDRINE SULFATE 50 MG/ML IJ SOLN
INTRAMUSCULAR | Status: DC | PRN
  Administered 2016-05-12: 5 mg via INTRAVENOUS
  Administered 2016-05-12: 10 mg via INTRAVENOUS
  Administered 2016-05-12: 5 mg via INTRAVENOUS

## 2016-05-12 MED ORDER — SODIUM CHLORIDE 0.9 % IV SOLN
INTRAVENOUS | Status: DC
  Administered 2016-05-12: 1000 mL via INTRAVENOUS

## 2016-05-12 MED ORDER — GLYCOPYRROLATE 0.2 MG/ML IJ SOLN
INTRAMUSCULAR | Status: DC | PRN
  Administered 2016-05-12: 0.2 mg via INTRAVENOUS

## 2016-05-12 MED ORDER — IOPAMIDOL (ISOVUE-300) INJECTION 61%
INTRAVENOUS | Status: AC
  Administered 2016-05-12: 50 mL
  Filled 2016-05-12: qty 150

## 2016-05-12 NOTE — ED Notes (Signed)
Pt. BIB [facility] as code stroke. EMS called out for syncopal event at 1705. EMS witnessed sudden increased confusion and L side flaccid. Pt. AxO x4. Pt. Was working outside in heat all day.

## 2016-05-12 NOTE — ED Notes (Signed)
Patient transported to IR by RN and IR team

## 2016-05-12 NOTE — Progress Notes (Signed)
Code stroke called at 1752, patient arrived to [facility] ED via [facility] at [phone].  LSN 1728, pateint was working in the yard and had a syncopal episode, while EMS was with patient he developed confusion, facial droop and left side weakness.  NIHSS 10, TPA started at [place]. Patient had a CTA after start of TPA.  Patient to go to ICU

## 2016-05-12 NOTE — ED Notes (Signed)
EDP at bedside  

## 2016-05-12 NOTE — ED Notes (Signed)
Dr. Kirkpatrick at bedside 

## 2016-05-12 NOTE — H&P (Signed)
Neurology H&P Reason for Consult: Left  Referring Physician: Billy Fischer, E  CC: left-sided weakness  History is obtained from:Patient, family  HPI: Johnathan [name] is a 65 y.o. male who was in his normal state of health earlier working outside in the long. He had a syncopal episode At 1505.EMS was called and arrived on scene for the syncope. After their arrival, at 1528 he developed severe left-sided weakness.They called a code stroke and transported him to the ER where he was found to have a left hemiparesis with neglect.   LKW: 17:05 tpa given?: yes Premorbid modified rankin scale: 0  ROS: A 14 point ROS was performed and is negative except as noted in the HPI.   PMH: GERD  FHx: Stroke  Social History:  reports that he has been smoking Cigarettes.  He has been smoking about 1.00 pack per day. He does not have any smokeless tobacco history on file. He reports that he does not drink alcohol or use illicit drugs.   Exam: Current vital signs: BP 116/98 mmHg Vital signs in last 24 hours: BP: (116)/(98) 116/98 mmHg (06/10 1833)   Physical Exam  Constitutional: Appears well-developed and well-nourished.  Psych: Affect appropriate to situation Eyes: No scleral injection HENT: No OP obstrucion Head: Normocephalic.  Cardiovascular: Normal rate and regular rhythm.  Respiratory: Effort normal and breath sounds normal to anterior ascultation GI: Soft.  No distension. There is no tenderness.  Skin: WDI  Neuro: Mental Status: Patient is awake, alert, oriented to person, place, month, year, and situation. Patient is able to give a clear and coherent history. No signs of aphasia. He does have left negelct Cranial Nerves: II: Visual Fields are full. Pupils are equal, round, and reactive to light.  He extinguishes to DSS in the visual fields.  III,IV, VI: EOMI without ptosis or diploplia.  V: Facial sensation is diminished on the left VII: Facial movement is weak on the left.   VIII: hearing is intact to voice X: Uvula elevates symmetrically XI: Shoulder shrug is symmetric. XII: tongue is midline without atrophy or fasciculations.  Motor: Tone is normal. Bulk is normal. 5/5 strength was present on the right side. He has 4/5 weakness of his left leg and 3/5 weakness of his left arm.  Sensory: Sensation is nearly absent on the left, he does extinguish.  Cerebellar: No ataxia on the right, consistent with weakness on left.      I have reviewed labs in epic and the results pertinent to this consultation are: cmp - borderline creatinine  I have reviewed the images obtained: CTA head - appears to have a right M2 occlusion in addition to R carotid occulsion.   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 I suspect he occluded his carotid causing syncope, with the subsequent distal occlusion occuring after resulting in the onset of symptoms. Given that he does have a potentially occluded intracranial branch, I think he would benefit from angiogram to determine if he is an intervention candidate and have discussed with Dr. Estanislado Pandy who agrees.   Recommendations: 1. IR for angiogram.  2. MRI of the brain without contrast 3. Frequent neuro checks 4. Echocardiogram 5. HgbA1c, fasting lipid panel 6. Prophylactic therapy- none for 24 hours.  7. Risk factor modification 8. Telemetry monitoring 9. PT consult, OT consult, Speech consult 10. please page stroke NP  Or  PA  Or MD  M-F from 8am -4 pm starting 6/11 as this patient will be followed by the stroke  team at this point.   You can look them up on [email or web address]   11. Labetalol for hypertension.    This patient is critically ill and at significant risk of neurological worsening, death and care requires constant monitoring of vital signs, hemodynamics,respiratory and cardiac monitoring, neurological assessment, discussion with family, other specialists and medical decision making of high complexity. I  spent 90 minutes of neurocritical care time  in the care of  this patient.  Roland Rack, MD [facility] [phone]  If 7pm- 7am, please page neurology on call as listed in [place]. 05/12/2016  7:39 PM

## 2016-05-12 NOTE — Anesthesia Preprocedure Evaluation (Addendum)
Anesthesia Evaluation  Patient identified by MRN, date of birth, ID band Patient awake    Reviewed: Allergy & Precautions, NPO status , Patient's Chart, lab work & pertinent test results  Airway Mallampati: II  TM Distance: >3 FB Neck ROM: Full    Dental   Pulmonary Current Smoker,    Pulmonary exam normal        Cardiovascular Normal cardiovascular exam     Neuro/Psych CVA    GI/Hepatic   Endo/Other    Renal/GU      Musculoskeletal   Abdominal   Peds  Hematology   Anesthesia Other Findings   Reproductive/Obstetrics                            Anesthesia Physical Anesthesia Plan  ASA: III and emergent  Anesthesia Plan: General   Post-op Pain Management:    Induction: Intravenous  Airway Management Planned: Oral ETT  Additional Equipment:   Intra-op Plan:   Post-operative Plan: Possible Post-op intubation/ventilation  Informed Consent: I have reviewed the patients History and Physical, chart, labs and discussed the procedure including the risks, benefits and alternatives for the proposed anesthesia with the patient or authorized representative who has indicated his/her understanding and acceptance.   Dental advisory given  Plan Discussed with: CRNA, Surgeon and Anesthesiologist  Anesthesia Plan Comments:        Anesthesia Quick Evaluation

## 2016-05-12 NOTE — Anesthesia Procedure Notes (Signed)
Procedure Name: Intubation Date/Time: 05/12/2016 9:18 PM Performed by: Manuela Schwartz B Pre-anesthesia Checklist: Patient identified, Emergency Drugs available, Suction available, Patient being monitored and Timeout performed Patient Re-evaluated:Patient Re-evaluated prior to inductionOxygen Delivery Method: Circle system utilized Preoxygenation: Pre-oxygenation with 100% oxygen Intubation Type: IV induction and Rapid sequence Laryngoscope Size: Mac and 3 Grade View: Grade I Tube type: Subglottic suction tube Tube size: 7.5 mm Number of attempts: 1 Airway Equipment and Method: Stylet Placement Confirmation: ETT inserted through vocal cords under direct vision,  positive ETCO2 and breath sounds checked- equal and bilateral Secured at: 22 cm Tube secured with: Tape Dental Injury: Teeth and Oropharynx as per pre-operative assessment

## 2016-05-13 ENCOUNTER — Inpatient Hospital Stay (HOSPITAL_COMMUNITY): Payer: 59

## 2016-05-13 ENCOUNTER — Encounter (HOSPITAL_COMMUNITY): Payer: Self-pay | Admitting: Radiology

## 2016-05-13 DIAGNOSIS — I63031 Cerebral infarction due to thrombosis of right carotid artery: Secondary | ICD-10-CM | POA: Diagnosis present

## 2016-05-13 DIAGNOSIS — I6350 Cerebral infarction due to unspecified occlusion or stenosis of unspecified cerebral artery: Secondary | ICD-10-CM

## 2016-05-13 DIAGNOSIS — T82898A Other specified complication of vascular prosthetic devices, implants and grafts, initial encounter: Secondary | ICD-10-CM

## 2016-05-13 DIAGNOSIS — J9601 Acute respiratory failure with hypoxia: Secondary | ICD-10-CM

## 2016-05-13 LAB — CBC WITH DIFFERENTIAL/PLATELET
Basophils Absolute: 0 10*3/uL (ref 0.0–0.1)
Basophils Relative: 0 %
Eosinophils Absolute: 0.1 10*3/uL (ref 0.0–0.7)
Eosinophils Relative: 1 %
HCT: 40.9 % (ref 39.0–52.0)
Hemoglobin: 13.8 g/dL (ref 13.0–17.0)
Lymphocytes Relative: 18 %
Lymphs Abs: 1.7 10*3/uL (ref 0.7–4.0)
MCH: 32.1 pg (ref 26.0–34.0)
MCHC: 33.7 g/dL (ref 30.0–36.0)
MCV: 95.1 fL (ref 78.0–100.0)
Monocytes Absolute: 0.6 10*3/uL (ref 0.1–1.0)
Monocytes Relative: 7 %
Neutro Abs: 7.1 10*3/uL (ref 1.7–7.7)
Neutrophils Relative %: 74 %
Platelets: 222 10*3/uL (ref 150–400)
RBC: 4.3 MIL/uL (ref 4.22–5.81)
RDW: 13.2 % (ref 11.5–15.5)
WBC: 9.5 10*3/uL (ref 4.0–10.5)

## 2016-05-13 LAB — BLOOD GAS, ARTERIAL
Acid-base deficit: 1.5 mmol/L (ref 0.0–2.0)
Bicarbonate: 23.4 mEq/L (ref 20.0–24.0)
Drawn by: 44135
FIO2: 0.4
MECHVT: 520 mL
O2 Saturation: 98.7 %
PEEP: 5 cmH2O
Patient temperature: 98.6
RATE: 14 resp/min
TCO2: 24.7 mmol/L (ref 0–100)
pCO2 arterial: 44.1 mmHg (ref 35.0–45.0)
pH, Arterial: 7.344 — ABNORMAL LOW (ref 7.350–7.450)
pO2, Arterial: 136 mmHg — ABNORMAL HIGH (ref 80.0–100.0)

## 2016-05-13 LAB — ECHOCARDIOGRAM COMPLETE
Ao-asc: 32 cm
E decel time: 278 msec
E/e' ratio: 4.49
FS: 45 % — AB (ref 28–44)
Height: 66 in
IVS/LV PW RATIO, ED: 0.96
LA ID, A-P, ES: 31 mm
LA diam end sys: 31 mm
LA diam index: 1.65 cm/m2
LA vol A4C: 24.4 ml
LA vol index: 19.4 mL/m2
LA vol: 36.4 mL
LV E/e' medial: 4.49
LV E/e'average: 4.49
LV PW d: 9.61 mm — AB (ref 0.6–1.1)
LV dias vol index: 68 mL/m2
LV dias vol: 127 mL (ref 62–150)
LV e' LATERAL: 12.2 cm/s
LV sys vol index: 23 mL/m2
LV sys vol: 44 mL (ref 21–61)
LVOT area: 3.46 cm2
LVOT diameter: 21 mm
MV Dec: 278
MV pk A vel: 58.7 m/s
MV pk E vel: 54.8 m/s
Simpson's disk: 66
Stroke v: 84 ml
TAPSE: 22.4 mm
TDI e' lateral: 12.2
TDI e' medial: 6.74
Weight: 2776.03 oz

## 2016-05-13 LAB — RAPID URINE DRUG SCREEN, HOSP PERFORMED
Amphetamines: NOT DETECTED
Barbiturates: NOT DETECTED
Benzodiazepines: POSITIVE — AB
Cocaine: NOT DETECTED
Opiates: NOT DETECTED
Tetrahydrocannabinol: NOT DETECTED

## 2016-05-13 LAB — BASIC METABOLIC PANEL
Anion gap: 7 (ref 5–15)
BUN: 10 mg/dL (ref 6–20)
CO2: 24 mmol/L (ref 22–32)
Calcium: 7.9 mg/dL — ABNORMAL LOW (ref 8.9–10.3)
Chloride: 108 mmol/L (ref 101–111)
Creatinine, Ser: 1.25 mg/dL — ABNORMAL HIGH (ref 0.61–1.24)
GFR calc Af Amer: >60 mL/min (ref >60)
GFR calc non Af Amer: 59 mL/min — ABNORMAL LOW (ref >60)
Glucose, Bld: 113 mg/dL — ABNORMAL HIGH (ref 65–99)
Potassium: 3.8 mmol/L (ref 3.5–5.1)
Sodium: 139 mmol/L (ref 135–145)

## 2016-05-13 LAB — LIPID PANEL
Cholesterol: 127 mg/dL (ref 0–200)
HDL: 30 mg/dL — ABNORMAL LOW (ref >40)
LDL Cholesterol: 70 mg/dL (ref 0–99)
Total CHOL/HDL Ratio: 4.2 RATIO
Triglycerides: 136 mg/dL (ref <150)
VLDL: 27 mg/dL (ref 0–40)

## 2016-05-13 LAB — MRSA PCR SCREENING: MRSA by PCR: NEGATIVE

## 2016-05-13 LAB — PLATELET INHIBITION P2Y12: Platelet Function  P2Y12: 200 [PRU] (ref 194–418)

## 2016-05-13 MED ORDER — ONDANSETRON HCL 4 MG/2ML IJ SOLN: 4.0000 mg | Freq: Four times a day (QID) | INTRAMUSCULAR | Status: DC | PRN

## 2016-05-13 MED ORDER — ANTISEPTIC ORAL RINSE SOLUTION (CORINZ)
7.0000 mL | OROMUCOSAL | Status: DC
  Administered 2016-05-13 – 2016-05-14 (×15): 7 mL via OROMUCOSAL

## 2016-05-13 MED ORDER — MIDAZOLAM HCL 2 MG/2ML IJ SOLN
2.0000 mg | INTRAMUSCULAR | Status: DC | PRN
Start: 2016-05-13 — End: 2016-05-16
  Administered 2016-05-13 (×4): 2 mg via INTRAVENOUS
  Filled 2016-05-13 (×3): qty 2

## 2016-05-13 MED ORDER — SODIUM CHLORIDE 0.9 % IV SOLN
INTRAVENOUS | Status: DC
  Administered 2016-05-13 (×2): via INTRAVENOUS
  Administered 2016-05-14: 1000 mL via INTRAVENOUS

## 2016-05-13 MED ORDER — FENTANYL CITRATE (PF) 100 MCG/2ML IJ SOLN
100.0000 ug | INTRAMUSCULAR | Status: DC | PRN
Start: 2016-05-13 — End: 2016-05-16
  Administered 2016-05-13 – 2016-05-14 (×4): 100 ug via INTRAVENOUS
  Filled 2016-05-13 (×5): qty 2

## 2016-05-13 MED ORDER — PROPOFOL 500 MG/50ML IV EMUL
INTRAVENOUS | Status: DC | PRN
  Administered 2016-05-13: 50 ug/kg/min via INTRAVENOUS

## 2016-05-13 MED ORDER — ACETAMINOPHEN 650 MG RE SUPP: 650.0000 mg | RECTAL | Status: DC | PRN

## 2016-05-13 MED ORDER — SODIUM CHLORIDE 0.9 % IV BOLUS (SEPSIS)
1000.0000 mL | Freq: Once | INTRAVENOUS | Status: AC
  Administered 2016-05-13: 1000 mL via INTRAVENOUS

## 2016-05-13 MED ORDER — CLOPIDOGREL BISULFATE 75 MG PO TABS
75.0000 mg | ORAL_TABLET | Freq: Every day | ORAL | Status: DC
  Administered 2016-05-13 – 2016-05-16 (×4): 75 mg via ORAL
  Filled 2016-05-13 (×4): qty 1

## 2016-05-13 MED ORDER — PERFLUTREN LIPID MICROSPHERE
INTRAVENOUS | Status: AC
  Filled 2016-05-13: qty 10

## 2016-05-13 MED ORDER — FENTANYL CITRATE (PF) 100 MCG/2ML IJ SOLN
100.0000 ug | INTRAMUSCULAR | Status: DC | PRN
  Administered 2016-05-13 (×2): 100 ug via INTRAVENOUS
  Filled 2016-05-13 (×2): qty 2

## 2016-05-13 MED ORDER — CHLORHEXIDINE GLUCONATE 0.12% ORAL RINSE (MEDLINE KIT)
15.0000 mL | Freq: Two times a day (BID) | OROMUCOSAL | Status: DC
  Administered 2016-05-13 (×2): 15 mL via OROMUCOSAL

## 2016-05-13 MED ORDER — ABCIXIMAB 2 MG/ML IV SOLN
5.0000 mg | Freq: Once | INTRAVENOUS | Status: AC
  Administered 2016-05-13: 4 mg via INTRAVENOUS

## 2016-05-13 MED ORDER — ACETAMINOPHEN 325 MG PO TABS: 650.0000 mg | ORAL_TABLET | ORAL | Status: DC | PRN

## 2016-05-13 MED ORDER — IOPAMIDOL (ISOVUE-300) INJECTION 61%
INTRAVENOUS | Status: AC
  Filled 2016-05-13: qty 50

## 2016-05-13 MED ORDER — ACETAMINOPHEN 650 MG RE SUPP: 650.0000 mg | Freq: Four times a day (QID) | RECTAL | Status: DC | PRN

## 2016-05-13 MED ORDER — PANTOPRAZOLE SODIUM 40 MG PO TBEC
40.0000 mg | DELAYED_RELEASE_TABLET | Freq: Every day | ORAL | Status: DC
  Administered 2016-05-15 – 2016-05-16 (×2): 40 mg via ORAL
  Filled 2016-05-13 (×2): qty 1

## 2016-05-13 MED ORDER — CHLORHEXIDINE GLUCONATE 0.12% ORAL RINSE (MEDLINE KIT)
15.0000 mL | Freq: Two times a day (BID) | OROMUCOSAL | Status: DC
  Administered 2016-05-13 – 2016-05-16 (×4): 15 mL via OROMUCOSAL

## 2016-05-13 MED ORDER — NICARDIPINE HCL IN NACL 20-0.86 MG/200ML-% IV SOLN
5.0000 mg/h | INTRAVENOUS | Status: DC
  Administered 2016-05-13: 5 mg/h via INTRAVENOUS
  Filled 2016-05-13: qty 200

## 2016-05-13 MED ORDER — ACETAMINOPHEN 500 MG PO TABS
1000.0000 mg | ORAL_TABLET | Freq: Four times a day (QID) | ORAL | Status: DC | PRN
  Administered 2016-05-14 – 2016-05-15 (×2): 1000 mg via ORAL
  Filled 2016-05-13 (×2): qty 2

## 2016-05-13 MED ORDER — STROKE: EARLY STAGES OF RECOVERY BOOK
Freq: Once | Status: AC
  Administered 2016-05-13: 01:00:00
  Filled 2016-05-13: qty 1

## 2016-05-13 MED ORDER — PROPOFOL 1000 MG/100ML IV EMUL
0.0000 ug/kg/min | INTRAVENOUS | Status: DC
Start: 2016-05-13 — End: 2016-05-15
  Administered 2016-05-13 (×4): 50 ug/kg/min via INTRAVENOUS
  Administered 2016-05-13: 40 ug/kg/min via INTRAVENOUS
  Administered 2016-05-13 (×2): 50 ug/kg/min via INTRAVENOUS
  Administered 2016-05-14 (×2): 40 ug/kg/min via INTRAVENOUS
  Filled 2016-05-13 (×9): qty 100

## 2016-05-13 MED ORDER — PERFLUTREN LIPID MICROSPHERE
1.0000 mL | INTRAVENOUS | Status: AC | PRN
  Administered 2016-05-13: 2 mL via INTRAVENOUS
  Filled 2016-05-13: qty 10

## 2016-05-13 MED ORDER — MIDAZOLAM HCL 2 MG/2ML IJ SOLN
INTRAMUSCULAR | Status: AC
  Administered 2016-05-13: 2 mg via INTRAVENOUS
  Filled 2016-05-13: qty 2

## 2016-05-13 MED ORDER — ASPIRIN 325 MG PO TABS
325.0000 mg | ORAL_TABLET | Freq: Every day | ORAL | Status: DC
  Administered 2016-05-13 – 2016-05-16 (×4): 325 mg via ORAL
  Filled 2016-05-13 (×4): qty 1

## 2016-05-13 MED ORDER — ANTISEPTIC ORAL RINSE SOLUTION (CORINZ)
7.0000 mL | Freq: Four times a day (QID) | OROMUCOSAL | Status: DC
  Administered 2016-05-13: 7 mL via OROMUCOSAL

## 2016-05-13 MED ORDER — FAMOTIDINE IN NACL 20-0.9 MG/50ML-% IV SOLN
20.0000 mg | Freq: Two times a day (BID) | INTRAVENOUS | Status: DC
Start: 2016-05-13 — End: 2016-05-15
  Administered 2016-05-13 – 2016-05-14 (×4): 20 mg via INTRAVENOUS
  Filled 2016-05-13 (×4): qty 50

## 2016-05-13 NOTE — Progress Notes (Signed)
MD aware of patient's dark pink tinged urine. RN will continue to monitor.

## 2016-05-13 NOTE — ED Provider Notes (Signed)
CSN: [id number]     Arrival date & time 05/12/16  1813 History   First MD Initiated Contact with Patient 05/12/16 1833     Chief Complaint  Patient presents with  . Code Stroke    @EDPCLEARED @ (Consider location/radiation/quality/duration/timing/severity/associated sxs/prior Treatment) HPI Comments: Syncopal episode without prodrome 1705, family reports noticed left sided upper ext weakness, gaze deviation following syncopal episode, thought speech was slurred.  Report symptoms worsened en route to hospital and EMS called code stroke. Pt denies injuries from fall, no neck pain, cp, abd pain  Patient is a 65 y.o. male presenting with Acute Neurological Problem.  Cerebrovascular Accident This is a new problem. The current episode started 1 to 2 hours ago. The problem occurs constantly. The problem has not changed since onset.Associated symptoms include headaches. Pertinent negatives include no chest pain, no abdominal pain and no shortness of breath. Nothing aggravates the symptoms. Nothing relieves the symptoms. He has tried nothing for the symptoms. The treatment provided no relief.    History reviewed. No pertinent past medical history. Past Surgical History  Procedure Laterality Date  . Leg surgery     History reviewed. No pertinent family history. Social History  Substance Use Topics  . Smoking status: Current Every Day Smoker -- 1.00 packs/day    Types: Cigarettes  . Smokeless tobacco: None  . Alcohol Use: No    Review of Systems  Constitutional: Negative for fever.  HENT: Negative for sore throat.   Eyes: Negative for visual disturbance.  Respiratory: Negative for shortness of breath.   Cardiovascular: Negative for chest pain.  Gastrointestinal: Negative for nausea, vomiting and abdominal pain.  Genitourinary: Negative for difficulty urinating.  Musculoskeletal: Negative for back pain and neck stiffness.  Skin: Positive for wound. Negative for rash.  Neurological:  Positive for syncope, speech difficulty, weakness and headaches. Negative for facial asymmetry and numbness.      Allergies  Review of patient's allergies indicates no known allergies.  Home Medications   Prior to Admission medications   Medication Sig Start Date End Date Taking? Authorizing Provider  omeprazole (PRILOSEC OTC) 20 MG tablet Take 20 mg by mouth every morning.   Yes Historical Provider, MD   BP 121/74 mmHg  Pulse 51  Temp(Src) 96.7 F (35.9 C) (Axillary)  Resp 14  Ht 5\' 6"  (1.676 m)  Wt 173 lb 8 oz (78.7 kg)  BMI 28.02 kg/m2  SpO2 100% Physical Exam  Constitutional: He is oriented to person, place, and time. He appears well-developed and well-nourished. No distress.  HENT:  Head: Normocephalic and atraumatic.  Eyes: Conjunctivae and EOM are normal.  Neck: Normal range of motion.  Cardiovascular: Normal rate, regular rhythm, normal heart sounds and intact distal pulses.  Exam reveals no gallop and no friction rub.   No murmur heard. Pulmonary/Chest: Effort normal and breath sounds normal. No respiratory distress. He has no wheezes. He has no rales.  Abdominal: Soft. He exhibits no distension. There is no tenderness. There is no guarding.  Musculoskeletal: He exhibits no edema.  Neurological: He is alert and oriented to person, place, and time. GCS eye subscore is 4. GCS verbal subscore is 5. GCS motor subscore is 6.  LUE 3/5, LLE 4/5  Skin: Skin is warm and dry. Abrasion (left anterior shoulder/chest, left finger abrasion) noted. He is not diaphoretic.  Nursing note and vitals reviewed.   ED Course  Procedures (including critical care time) Labs Review Labs Reviewed  PROTIME-INR - Abnormal; Notable for the following:  Prothrombin Time 15.6 (*)    All other components within normal limits  CBC - Abnormal; Notable for the following:    WBC 10.7 (*)    All other components within normal limits  DIFFERENTIAL - Abnormal; Notable for the following:     Neutro Abs 8.5 (*)    All other components within normal limits  COMPREHENSIVE METABOLIC PANEL - Abnormal; Notable for the following:    Glucose, Bld 147 (*)    Creatinine, Ser 1.47 (*)    GFR calc non Af Amer 49 (*)    GFR calc Af Amer 56 (*)    All other components within normal limits  LIPID PANEL - Abnormal; Notable for the following:    HDL 30 (*)    All other components within normal limits  BASIC METABOLIC PANEL - Abnormal; Notable for the following:    Glucose, Bld 113 (*)    Creatinine, Ser 1.25 (*)    Calcium 7.9 (*)    GFR calc non Af Amer 59 (*)    All other components within normal limits  BLOOD GAS, ARTERIAL - Abnormal; Notable for the following:    pH, Arterial 7.344 (*)    pO2, Arterial 136 (*)    All other components within normal limits  URINE RAPID DRUG SCREEN, HOSP PERFORMED - Abnormal; Notable for the following:    Benzodiazepines POSITIVE (*)    All other components within normal limits  CBG MONITORING, ED - Abnormal; Notable for the following:    Glucose-Capillary 174 (*)    All other components within normal limits  I-STAT CHEM 8, ED - Abnormal; Notable for the following:    Creatinine, Ser 1.40 (*)    Glucose, Bld 146 (*)    Calcium, Ion 1.08 (*)    All other components within normal limits  MRSA PCR SCREENING  APTT  CBC WITH DIFFERENTIAL/PLATELET  HEMOGLOBIN A1C  PLATELET INHIBITION P2Y12  I-STAT TROPOININ, ED    Imaging Review Ct Angio Head W/cm &/or Wo Cm  05/12/2016  CLINICAL DATA:  Acute onset of left-sided weakness. Stroke syndrome. EXAM: CT ANGIOGRAPHY HEAD AND NECK TECHNIQUE: Multidetector CT imaging of the head and neck was performed using the standard protocol during bolus administration of intravenous contrast. Multiplanar CT image reconstructions and MIPs were obtained to evaluate the vascular anatomy. Carotid stenosis measurements (when applicable) are obtained utilizing NASCET criteria, using the distal internal carotid diameter as the  denominator. CONTRAST:  50 cc Isovue 370 COMPARISON:  CT earlier same day FINDINGS: CTA NECK Aortic arch: Mild atherosclerosis of the aortic arch. No aneurysm or dissection. Branching pattern is normal, with the variation of the left vertebral artery arising directly from the arch. Right carotid system: Common carotid artery widely patent to the bifurcation. There is advanced atherosclerosis at the right carotid bifurcation with some soft plaque in the ICA bulb and occlusion of the ICA in the bulb. No antegrade flow beyond the bulb. No ECA stenosis. Left carotid system: Common carotid artery widely patent to the bifurcation. Mild atherosclerotic disease at the carotid bifurcation. Soft plaque at the ICA bulb region with minimal diameter of 3 mm. This is consistent with a 40% stenosis. Vertebral arteries:Both vertebral arteries widely patent at their origins and through the cervical region. As noted, the left vertebral artery arises from the arch. Skeleton: Ordinary mild spondylosis. Other neck: No soft tissue lesion. Lung apices clear. Mild emphysema. CTA HEAD Anterior circulation: No antegrade flow in the right internal carotid artery at the  skullbase level. Reconstituted flow within the carotid siphon. Atherosclerotic calcification in the siphon but no stenosis in that region. Right anterior cerebral artery is widely patent. Patent anterior communicating artery. Left M1 segment is widely patent. At the MCA bifurcation, I think there is a missing branch. There appear to be missing branch vessels in the right parietal region. Left ICA is widely patent at the skull base and through the siphon region. Left anterior and middle cerebral vessels appear widely patent. Posterior circulation: Both vertebral arteries are widely patent to the basilar. No basilar stenosis. Posterior circulation branch vessels intact. Venous sinuses: Patent and normal Anatomic variants: None significant Delayed phase: No abnormal enhancement.  Question early loss of gray-white differentiation in the right insular and parietal region. 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 No antegrade flow beyond that in the neck. Reconstitution at the carotid siphon region probably from external to internal collaterals and flow from a patent anterior communicating artery. I think there is probably a missing branch vessel of the right MCA bifurcation with diminished distal vessel supply in the upper insular and parietal region on the right. Atherosclerotic plaque in the ICA bulb region on the left with 40% stenosis. Critical Value/emergent results were discussed in person at the time of interpretation on 05/12/2016 at 7:00 pm to Dr. Roland Rack , who verbally acknowledged these results. Electronically Signed   By: Nelson Chimes M.D.   On: 05/12/2016 19:26   Ct Head Wo Contrast  05/13/2016  CLINICAL DATA:  Follow-up code stroke. RIGHT MCA occlusion. Status post revascularization of RIGHT internal carotid artery occlusion. EXAM: CT HEAD WITHOUT CONTRAST TECHNIQUE: Contiguous axial images were obtained from the base of the skull through the vertex without intravenous contrast. COMPARISON:  CT HEAD May 12, 2016 at 1816 hours FINDINGS: INTRACRANIAL CONTENTS: The ventricles and sulci are normal for age. No intraparenchymal hemorrhage, mass effect nor midline shift. Small area loss of RIGHT insular gray-white matter differentiation associated with focally dense RIGHT insular middle cerebral artery. Residual intravascular contrast. No abnormal extra-axial fluid collections. Basal cisterns are patent. Mild calcific atherosclerosis of the carotid siphons. ORBITS: The included ocular globes and orbital contents are non-suspicious. SINUSES: Moderate paranasal sinusitis with RIGHT maxillary mucoperiosteal reaction. Mastoid air cells are well aerated. SKULL/SOFT TISSUES: No skull fracture. No significant soft tissue swelling. IMPRESSION: Small acute RIGHT MCA  territory infarct involving the insula with focally dense RIGHT insular MCA concerning for thromboembolism. These results will be called to the ordering clinician or representative by the Radiologist Assistant, and communication documented in the PACS or zVision Dashboard. Electronically Signed   By: Elon Alas M.D.   On: 05/13/2016 01:21   Ct Head Wo Contrast  05/12/2016  CLINICAL DATA:  Left-sided weakness which occurred while working in the yard. EXAM: CT HEAD WITHOUT CONTRAST TECHNIQUE: Contiguous axial images were obtained from the base of the skull through the vertex without intravenous contrast. COMPARISON:  None. FINDINGS: No brain atrophy. No definite acute finding. Tiny calcifications in the basal ganglia bilaterally. One could question early loss of gray-white differentiation in the deep insula on the right. This is not definite. I would give this either a 9 or 10 aspects score. No mass lesion, hydrocephalus or extra-axial collection. No calvarial abnormality. Mild inflammatory changes of the paranasal sinuses. IMPRESSION: No definite acute finding. Normal versus early loss of gray-white differentiation in the deep insula on the right. This is either aspects 9 or aspects 10. These results were called by telephone  at the time of interpretation on 05/12/2016 at 6:31 pm to Dr. Leonel Ramsay, who verbally acknowledged these results. Electronically Signed   By: Nelson Chimes M.D.   On: 05/12/2016 18:40   Ct Angio Neck W/cm &/or Wo/cm  05/12/2016  CLINICAL DATA:  Acute onset of left-sided weakness. Stroke syndrome. EXAM: CT ANGIOGRAPHY HEAD AND NECK TECHNIQUE: Multidetector CT imaging of the head and neck was performed using the standard protocol during bolus administration of intravenous contrast. Multiplanar CT image reconstructions and MIPs were obtained to evaluate the vascular anatomy. Carotid stenosis measurements (when applicable) are obtained utilizing NASCET criteria, using the distal internal  carotid diameter as the denominator. CONTRAST:  50 cc Isovue 370 COMPARISON:  CT earlier same day FINDINGS: CTA NECK Aortic arch: Mild atherosclerosis of the aortic arch. No aneurysm or dissection. Branching pattern is normal, with the variation of the left vertebral artery arising directly from the arch. Right carotid system: Common carotid artery widely patent to the bifurcation. There is advanced atherosclerosis at the right carotid bifurcation with some soft plaque in the ICA bulb and occlusion of the ICA in the bulb. No antegrade flow beyond the bulb. No ECA stenosis. Left carotid system: Common carotid artery widely patent to the bifurcation. Mild atherosclerotic disease at the carotid bifurcation. Soft plaque at the ICA bulb region with minimal diameter of 3 mm. This is consistent with a 40% stenosis. Vertebral arteries:Both vertebral arteries widely patent at their origins and through the cervical region. As noted, the left vertebral artery arises from the arch. Skeleton: Ordinary mild spondylosis. Other neck: No soft tissue lesion. Lung apices clear. Mild emphysema. CTA HEAD Anterior circulation: No antegrade flow in the right internal carotid artery at the skullbase level. Reconstituted flow within the carotid siphon. Atherosclerotic calcification in the siphon but no stenosis in that region. Right anterior cerebral artery is widely patent. Patent anterior communicating artery. Left M1 segment is widely patent. At the MCA bifurcation, I think there is a missing branch. There appear to be missing branch vessels in the right parietal region. Left ICA is widely patent at the skull base and through the siphon region. Left anterior and middle cerebral vessels appear widely patent. Posterior circulation: Both vertebral arteries are widely patent to the basilar. No basilar stenosis. Posterior circulation branch vessels intact. Venous sinuses: Patent and normal Anatomic variants: None significant Delayed phase: No  abnormal enhancement. Question early loss of gray-white differentiation in the right insular and parietal region. 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 No antegrade flow beyond that in the neck. Reconstitution at the carotid siphon region probably from external to internal collaterals and flow from a patent anterior communicating artery. I think there is probably a missing branch vessel of the right MCA bifurcation with diminished distal vessel supply in the upper insular and parietal region on the right. Atherosclerotic plaque in the ICA bulb region on the left with 40% stenosis. Critical Value/emergent results were discussed in person at the time of interpretation on 05/12/2016 at 7:00 pm to Dr. Roland Rack , who verbally acknowledged these results. Electronically Signed   By: Nelson Chimes M.D.   On: 05/12/2016 19:26   Portable Chest Xray  05/13/2016  CLINICAL DATA:  65 year old male status post intubation. EXAM: PORTABLE CHEST 1 VIEW COMPARISON:  Chest radiograph dated 09/19/2010 FINDINGS: Endotracheal tube approximately 4 cm above the carina. An enteric tube courses towards the left upper abdomen with tip beyond the inferior margin of the image. The lungs are  clear. No pleural effusion or pneumothorax. The cardiac silhouette is within normal limits with no acute osseous pathology. IMPRESSION: Endotracheal tube above the carina. Electronically Signed   By: Anner Crete M.D.   On: 05/13/2016 02:22   Dg Abd Portable 1v  05/13/2016  CLINICAL DATA:  Nasogastric tube placement. EXAM: PORTABLE ABDOMEN - 1 VIEW COMPARISON:  Chest radiograph of earlier today FINDINGS: Nasogastric tube terminates the proximal stomach, with side port above the gastroesophageal junction. Nonobstructive bowel gas pattern. Low pelvis excluded. IMPRESSION: Nasogastric tube terminates at the proximal stomach with side port above the gastroesophageal junction. Recommend advancement for optimal positioning. These  results will be called to the ordering clinician or representative by the Radiologist Assistant, and communication documented in the PACS or zVision Dashboard. Electronically Signed   By: Abigail Miyamoto M.D.   On: 05/13/2016 08:52   I have personally reviewed and evaluated these images and lab results as part of my medical decision-making.   EKG Interpretation   Date/Time:  Saturday May 12 2016 20:02:59 EDT Ventricular Rate:  91 PR Interval:  164 QRS Duration: 104 QT Interval:  365 QTC Calculation: 449 R Axis:   51 Text Interpretation:  Sinus rhythm No significant change since last  tracing Confirmed by [facility] MD, [place] ([id number]) on 05/13/2016 12:00:52 PM      CRITICAL CARE: Code Stroke receiving tPA Performed by: Alvino Chapel   Total critical care time: 30 minutes  Critical care time was exclusive of separately billable procedures and treating other patients.  Critical care was necessary to treat or prevent imminent or life-threatening deterioration.  Critical care was time spent personally by me on the following activities: development of treatment plan with patient and/or surrogate as well as nursing, discussions with consultants, evaluation of patient's response to treatment, examination of patient, obtaining history from patient or surrogate, ordering and performing treatments and interventions, ordering and review of laboratory studies, ordering and review of radiographic studies, pulse oximetry and re-evaluation of patient's condition.  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 Glucose WNL.  Neurology evaluated emergently and initiated tPA. CT head negative. Patient with fall from syncopal episode, has abrasion/contusion however doubt other intrathoracic and intraabdominal injury.  Weakness improving, however pt with potentially occluded intracranial MCA branch and will be taken to IR for angiogram.      Gareth Morgan, MD 05/13/16 1234

## 2016-05-13 NOTE — Progress Notes (Addendum)
RN notified neurologist regarding patient's bladder scan revealing 785cc of urine. Orders received to in and out cath the patient.

## 2016-05-13 NOTE — Procedures (Signed)
S/P bilateral common carotid and RT Vert A angiograms followed by superselective infusion of 5mg  of tpa and endovascul;ar revascularization of symptomatic occluded RT ICA prox with stent assisted angioplasty ,and  infusion of 18 mg of IA Integrelin and 9 mg of REopro into intrastent thrombosis with gradual clearance of filling defects

## 2016-05-13 NOTE — Progress Notes (Signed)
Soft collar applied. RN will continue to monitor.

## 2016-05-13 NOTE — Progress Notes (Signed)
Utilization review completed.  

## 2016-05-13 NOTE — Progress Notes (Signed)
Neuro IR 05/13/16 Rt 7 french sheath removed at 1020.  Exoseal closure device and manual pressure used to achieve hemostasis at 1045.  Sheath was intact and site was unremarkable.  Distal pulses intact.  Site reviewed with Arboriculturist.  Pressure dressing applied. No acute complications. Melinda Alewine RT-R [employer] RT-R

## 2016-05-13 NOTE — Progress Notes (Signed)
RN notified neuro MD regarding inserting foley cath order per CCM MD and that with previous in and out cath, patient had a couple of small blood clots. Neuro MD aware, states okay to place foley cath. RN will continue to monitor.

## 2016-05-13 NOTE — Progress Notes (Signed)
RT did not place arterial line per Dr. Judge Stall. Patient has sheath in place. RT drew arterial blood gas from left radial artery.

## 2016-05-13 NOTE — Progress Notes (Signed)
Place ETT holder with no complications. No breakdown noted. Moved ETT to pts left.

## 2016-05-13 NOTE — Progress Notes (Signed)
RN notified Dr. Estanislado Pandy regarding patients low blood pressure. Patient's femoral sheath and cuff pressure are not correlating. Dr. Estanislado Pandy states SBP goals are for cuff pressures and to notify CCM. RN notifies CCM MD.  Orders received for 1L bolus and to insert foley cath. MD aware that patient was given TPA at [facility] and that patient has a right femoral sheath in. RN will continue to monitor.

## 2016-05-13 NOTE — Progress Notes (Signed)
Cardene gtt started based on femoral sheath pressure per instructions from Dr. Jimmy Footman. RN will continue to monitor.

## 2016-05-13 NOTE — Progress Notes (Signed)
PULMONARY / CRITICAL CARE MEDICINE   Name: Johnathan [name] MRN: [id number] DOB: 1951/03/06    ADMISSION DATE:  05/12/2016 CONSULTATION DATE:  May 13, 2016  REFERRING MD:  Greta Doom, MD  CHIEF COMPLAINT:  Syncope  HISTORY OF PRESENT ILLNESS:   Johnathan [name] is a 65 y/o man with a PMHx of tobacco abuse who presented to the ED after he had syncope followed by left sided weakness. He was taken to the ED as a code stroke, and was taken to interventional neuroradiology who did superselective (5 mg) of tPA to right ICA with stent placement. This was complicated by transient instent thrombosis, but this was successfully treated with good flow. PCCM was consulted for ventilator and critical care management while in the neuro ICU.  Subjective: Agitated when not sedated, sheath just came out.  VITAL SIGNS: BP 96/67 mmHg  Pulse 53  Temp(Src) 96.7 F (35.9 C) (Axillary)  Resp 9  Ht 5\' 6"  (1.676 m)  Wt 78.7 kg (173 lb 8 oz)  BMI 28.02 kg/m2  SpO2 100%  HEMODYNAMICS:    VENTILATOR SETTINGS: Vent Mode:  [-] PRVC FiO2 (%):  [40 %] 40 % Set Rate:  [14 bmp] 14 bmp Vt Set:  [520 mL] 520 mL PEEP:  [5 cmH20] 5 cmH20 Plateau Pressure:  [12 cmH20-13 cmH20] 13 cmH20  INTAKE / OUTPUT: I/O last 3 completed shifts: In: 3915.4 [I.V.:2915.4; IV Piggyback:1000] Out: 1925 [Urine:1875; Blood:50]  PHYSICAL EXAMINATION: General:  Middle aged man in NAD Neuro:  Sedated, but arousable   HEENT:  MMM, [place]/AT, PERRL, EOM-I Cardiovascular:  Heart sounds dual and nromal Lungs:  CTAB Abdomen:  Soft, ND, ND and +BS. Musculoskeletal:  No deformities Skin:  No rashes on exposed skin, significant sun exposure  LABS:  BMET  Recent Labs Lab 05/12/16 1828 05/12/16 1835 05/13/16 0210  NA 139 137 139  K 3.9 4.0 3.8  CL 110 110 108  CO2 22  --  24  BUN 12 14 10   CREATININE 1.47* 1.40* 1.25*  GLUCOSE 147* 146* 113*    Electrolytes  Recent Labs Lab 05/12/16 1828 05/13/16 0210  CALCIUM  9.2 7.9*    CBC  Recent Labs Lab 05/12/16 1828 05/12/16 1835 05/13/16 0210  WBC 10.7*  --  9.5  HGB 16.0 16.3 13.8  HCT 46.8 48.0 40.9  PLT 247  --  222    Coag's  Recent Labs Lab 05/12/16 1828  APTT 27  INR 1.22    Sepsis Markers No results for input(s): LATICACIDVEN, PROCALCITON, O2SATVEN in the last 168 hours.  ABG  Recent Labs Lab 05/13/16 0220  PHART 7.344*  PCO2ART 44.1  PO2ART 136*    Liver Enzymes  Recent Labs Lab 05/12/16 1828  AST 19  ALT 22  ALKPHOS 67  BILITOT 1.1  ALBUMIN 4.1    Cardiac Enzymes No results for input(s): TROPONINI, PROBNP in the last 168 hours.  Glucose  Recent Labs Lab 05/12/16 1837  GLUCAP 174*    Imaging Ct Angio Head W/cm &/or Wo Cm  05/12/2016  CLINICAL DATA:  Acute onset of left-sided weakness. Stroke syndrome. EXAM: CT ANGIOGRAPHY HEAD AND NECK TECHNIQUE: Multidetector CT imaging of the head and neck was performed using the standard protocol during bolus administration of intravenous contrast. Multiplanar CT image reconstructions and MIPs were obtained to evaluate the vascular anatomy. Carotid stenosis measurements (when applicable) are obtained utilizing NASCET criteria, using the distal internal carotid diameter as the denominator. CONTRAST:  50 cc Isovue 370  COMPARISON:  CT earlier same day FINDINGS: CTA NECK Aortic arch: Mild atherosclerosis of the aortic arch. No aneurysm or dissection. Branching pattern is normal, with the variation of the left vertebral artery arising directly from the arch. Right carotid system: Common carotid artery widely patent to the bifurcation. There is advanced atherosclerosis at the right carotid bifurcation with some soft plaque in the ICA bulb and occlusion of the ICA in the bulb. No antegrade flow beyond the bulb. No ECA stenosis. Left carotid system: Common carotid artery widely patent to the bifurcation. Mild atherosclerotic disease at the carotid bifurcation. Soft plaque at the ICA  bulb region with minimal diameter of 3 mm. This is consistent with a 40% stenosis. Vertebral arteries:Both vertebral arteries widely patent at their origins and through the cervical region. As noted, the left vertebral artery arises from the arch. Skeleton: Ordinary mild spondylosis. Other neck: No soft tissue lesion. Lung apices clear. Mild emphysema. CTA HEAD Anterior circulation: No antegrade flow in the right internal carotid artery at the skullbase level. Reconstituted flow within the carotid siphon. Atherosclerotic calcification in the siphon but no stenosis in that region. Right anterior cerebral artery is widely patent. Patent anterior communicating artery. Left M1 segment is widely patent. At the MCA bifurcation, I think there is a missing branch. There appear to be missing branch vessels in the right parietal region. Left ICA is widely patent at the skull base and through the siphon region. Left anterior and middle cerebral vessels appear widely patent. Posterior circulation: Both vertebral arteries are widely patent to the basilar. No basilar stenosis. Posterior circulation branch vessels intact. Venous sinuses: Patent and normal Anatomic variants: None significant Delayed phase: No abnormal enhancement. Question early loss of gray-white differentiation in the right insular and parietal region. 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 No antegrade flow beyond that in the neck. Reconstitution at the carotid siphon region probably from external to internal collaterals and flow from a patent anterior communicating artery. I think there is probably a missing branch vessel of the right MCA bifurcation with diminished distal vessel supply in the upper insular and parietal region on the right. Atherosclerotic plaque in the ICA bulb region on the left with 40% stenosis. Critical Value/emergent results were discussed in person at the time of interpretation on 05/12/2016 at 7:00 pm to Dr. Roland Rack , who verbally acknowledged these results. Electronically Signed   By: Nelson Chimes M.D.   On: 05/12/2016 19:26   Ct Head Wo Contrast  05/13/2016  CLINICAL DATA:  Follow-up code stroke. RIGHT MCA occlusion. Status post revascularization of RIGHT internal carotid artery occlusion. EXAM: CT HEAD WITHOUT CONTRAST TECHNIQUE: Contiguous axial images were obtained from the base of the skull through the vertex without intravenous contrast. COMPARISON:  CT HEAD May 12, 2016 at 1816 hours FINDINGS: INTRACRANIAL CONTENTS: The ventricles and sulci are normal for age. No intraparenchymal hemorrhage, mass effect nor midline shift. Small area loss of RIGHT insular gray-white matter differentiation associated with focally dense RIGHT insular middle cerebral artery. Residual intravascular contrast. No abnormal extra-axial fluid collections. Basal cisterns are patent. Mild calcific atherosclerosis of the carotid siphons. ORBITS: The included ocular globes and orbital contents are non-suspicious. SINUSES: Moderate paranasal sinusitis with RIGHT maxillary mucoperiosteal reaction. Mastoid air cells are well aerated. SKULL/SOFT TISSUES: No skull fracture. No significant soft tissue swelling. IMPRESSION: Small acute RIGHT MCA territory infarct involving the insula with focally dense RIGHT insular MCA concerning for thromboembolism. These results will be called  to the ordering clinician or representative by the Radiologist Assistant, and communication documented in the PACS or zVision Dashboard. Electronically Signed   By: Elon Alas M.D.   On: 05/13/2016 01:21   Ct Head Wo Contrast  05/12/2016  CLINICAL DATA:  Left-sided weakness which occurred while working in the yard. EXAM: CT HEAD WITHOUT CONTRAST TECHNIQUE: Contiguous axial images were obtained from the base of the skull through the vertex without intravenous contrast. COMPARISON:  None. FINDINGS: No brain atrophy. No definite acute finding. Tiny  calcifications in the basal ganglia bilaterally. One could question early loss of gray-white differentiation in the deep insula on the right. This is not definite. I would give this either a 9 or 10 aspects score. No mass lesion, hydrocephalus or extra-axial collection. No calvarial abnormality. Mild inflammatory changes of the paranasal sinuses. IMPRESSION: No definite acute finding. Normal versus early loss of gray-white differentiation in the deep insula on the right. This is either aspects 9 or aspects 10. These results were called by telephone at the time of interpretation on 05/12/2016 at 6:31 pm to Dr. Leonel Ramsay, who verbally acknowledged these results. Electronically Signed   By: Nelson Chimes M.D.   On: 05/12/2016 18:40   Ct Angio Neck W/cm &/or Wo/cm  05/12/2016  CLINICAL DATA:  Acute onset of left-sided weakness. Stroke syndrome. EXAM: CT ANGIOGRAPHY HEAD AND NECK TECHNIQUE: Multidetector CT imaging of the head and neck was performed using the standard protocol during bolus administration of intravenous contrast. Multiplanar CT image reconstructions and MIPs were obtained to evaluate the vascular anatomy. Carotid stenosis measurements (when applicable) are obtained utilizing NASCET criteria, using the distal internal carotid diameter as the denominator. CONTRAST:  50 cc Isovue 370 COMPARISON:  CT earlier same day FINDINGS: CTA NECK Aortic arch: Mild atherosclerosis of the aortic arch. No aneurysm or dissection. Branching pattern is normal, with the variation of the left vertebral artery arising directly from the arch. Right carotid system: Common carotid artery widely patent to the bifurcation. There is advanced atherosclerosis at the right carotid bifurcation with some soft plaque in the ICA bulb and occlusion of the ICA in the bulb. No antegrade flow beyond the bulb. No ECA stenosis. Left carotid system: Common carotid artery widely patent to the bifurcation. Mild atherosclerotic disease at the  carotid bifurcation. Soft plaque at the ICA bulb region with minimal diameter of 3 mm. This is consistent with a 40% stenosis. Vertebral arteries:Both vertebral arteries widely patent at their origins and through the cervical region. As noted, the left vertebral artery arises from the arch. Skeleton: Ordinary mild spondylosis. Other neck: No soft tissue lesion. Lung apices clear. Mild emphysema. CTA HEAD Anterior circulation: No antegrade flow in the right internal carotid artery at the skullbase level. Reconstituted flow within the carotid siphon. Atherosclerotic calcification in the siphon but no stenosis in that region. Right anterior cerebral artery is widely patent. Patent anterior communicating artery. Left M1 segment is widely patent. At the MCA bifurcation, I think there is a missing branch. There appear to be missing branch vessels in the right parietal region. Left ICA is widely patent at the skull base and through the siphon region. Left anterior and middle cerebral vessels appear widely patent. Posterior circulation: Both vertebral arteries are widely patent to the basilar. No basilar stenosis. Posterior circulation branch vessels intact. Venous sinuses: Patent and normal Anatomic variants: None significant Delayed phase: No abnormal enhancement. Question early loss of gray-white differentiation in the right insular and parietal region. IMPRESSION: Occlusion  of the right ICA in the bulb. No antegrade flow beyond that in the neck. Reconstitution at the carotid siphon region probably from external to internal collaterals and flow from a patent anterior communicating artery. I think there is probably a missing branch vessel of the right MCA bifurcation with diminished distal vessel supply in the upper insular and parietal region on the right. Atherosclerotic plaque in the ICA bulb region on the left with 40% stenosis. Critical Value/emergent results were discussed in person at the time of interpretation on  05/12/2016 at 7:00 pm to Dr. Roland Rack , who verbally acknowledged these results. Electronically Signed   By: Nelson Chimes M.D.   On: 05/12/2016 19:26   Portable Chest Xray  05/13/2016  CLINICAL DATA:  65 year old male status post intubation. EXAM: PORTABLE CHEST 1 VIEW COMPARISON:  Chest radiograph dated 09/19/2010 FINDINGS: Endotracheal tube approximately 4 cm above the carina. An enteric tube courses towards the left upper abdomen with tip beyond the inferior margin of the image. The lungs are clear. No pleural effusion or pneumothorax. The cardiac silhouette is within normal limits with no acute osseous pathology. IMPRESSION: Endotracheal tube above the carina. Electronically Signed   By: Anner Crete M.D.   On: 05/13/2016 02:22   Dg Abd Portable 1v  05/13/2016  CLINICAL DATA:  Nasogastric tube placement. EXAM: PORTABLE ABDOMEN - 1 VIEW COMPARISON:  Chest radiograph of earlier today FINDINGS: Nasogastric tube terminates the proximal stomach, with side port above the gastroesophageal junction. Nonobstructive bowel gas pattern. Low pelvis excluded. IMPRESSION: Nasogastric tube terminates at the proximal stomach with side port above the gastroesophageal junction. Recommend advancement for optimal positioning. These results will be called to the ordering clinician or representative by the Radiologist Assistant, and communication documented in the PACS or zVision Dashboard. Electronically Signed   By: Abigail Miyamoto M.D.   On: 05/13/2016 08:52     STUDIES:  Angiogram 6/11  CULTURES: None  ANTIBIOTICS: None  SIGNIFICANT EVENTS: Taken to neuroradiology 6/11, s/p stent + catheter directed tPA.  LINES/TUBES: 7.5 mm ETT 6/10>> PIV x2 Foley 6/10>> R femoral arterial sheath 6/10?>>  DISCUSSION: 65 y/o man with hx of tobacco use presenting with acute stroke  ASSESSMENT / PLAN:  PULMONARY A: Need for mechanical ventilation Tobacco abuse P:   Maintain on PRVC while needs to be  flat, will not cooperate. SBT in AM. Counseling on tobacco use once extubated.  CARDIOVASCULAR A:  Maintain SBP 120-140 P:  Labetolol PRN  RENAL A:   Mild renal insuffiencey, unclear chronicity P:   S/p contrast from angiogram as well as CT study. At risk for contrast nephropathy. Monitor, maintain good hydration to avoid ATN. BMET in AM. Replace electrolytes as indicated.  GASTROINTESTINAL A:   No active issues P:   NPO PPI  HEMATOLOGIC A:   S/p tPA P:  Maintain precautions  INFECTIOUS A:   No issues P:   No indication of infections for now. Monitor WBC and fever.  ENDOCRINE A:   No issues P:   F/u HbA1c  NEUROLOGIC A:   Acute stroke, s/p tPA as well as R ICA stent P:   Management per primary team Sedation with propofol, prn versed RASS goal: 0  FAMILY  - Updates: No family bedside.  - Inter-disciplinary family meet or Palliative Care meeting due by:  June 18  The patient is critically ill with multiple organ systems failure and requires high complexity decision making for assessment and support, frequent evaluation and titration of  therapies, application of advanced monitoring technologies and extensive interpretation of multiple databases.   Critical Care Time devoted to patient care services described in this note is  45  Minutes. This time reflects time of care of this signee Dr Jennet Maduro. This critical care time does not reflect procedure time, or teaching time or supervisory time of PA/NP/Med student/Med Resident etc but could involve care discussion time.  Rush Farmer, M.D. [facility] Pulmonary/Critical Care Medicine. Pager: [phone]. After hours pager: [phone].   05/13/2016, 11:54 AM

## 2016-05-13 NOTE — Consult Note (Signed)
PULMONARY / CRITICAL CARE MEDICINE   Name: Johnathan [name] MRN: [id number] DOB: 1951/07/10    ADMISSION DATE:  05/12/2016 CONSULTATION DATE:  May 13, 2016  REFERRING MD:  Greta Doom, MD  CHIEF COMPLAINT:  Syncope  HISTORY OF PRESENT ILLNESS:   Johnathan [name] is a 65 y/o man with a PMHx of tobacco abuse who presented to the ED after he had syncope followed by left sided weakness. He was taken to the ED as a code stroke, and was taken to interventional neuroradiology who did superselective (5 mg) of tPA to right ICA with stent placement. This was complicated by transient instent thrombosis, but this was successfully treated with good flow. PCCM was consulted for ventilator and critical care management while in the neuro ICU.  PAST MEDICAL HISTORY :  He  has no past medical history on file.  PAST SURGICAL HISTORY: He  has past surgical history that includes Leg Surgery.  No Known Allergies  No current facility-administered medications on file prior to encounter.   No current outpatient prescriptions on file prior to encounter.    FAMILY HISTORY:  His has no family status information on file.   SOCIAL HISTORY: He  reports that he has been smoking Cigarettes.  He has been smoking about 1.00 pack per day. He does not have any smokeless tobacco history on file. He reports that he does not drink alcohol or use illicit drugs.  VITAL SIGNS: BP 151/87 mmHg  Pulse 86  Temp(Src) 98.1 F (36.7 C) (Oral)  Resp 15  Ht 5\' 6"  (1.676 m)  Wt 173 lb 8 oz (78.7 kg)  BMI 28.02 kg/m2  SpO2 100%  HEMODYNAMICS:    VENTILATOR SETTINGS: Vent Mode:  [-] PRVC FiO2 (%):  [40 %] 40 % Set Rate:  [14 bmp] 14 bmp Vt Set:  [520 mL] 520 mL PEEP:  [5 cmH20] 5 cmH20 Plateau Pressure:  [13 cmH20] 13 cmH20  INTAKE / OUTPUT:    PHYSICAL EXAMINATION: General:  Middle aged man in NAD Neuro:  Sedated, but arousable   HEENT:  MMM Cardiovascular:  Heart sounds dual and nromal Lungs:   CTAB Abdomen:  Soft Musculoskeletal:  No deformities Skin:  No rashes on exposed skin, significant sun exposure  LABS:  BMET  Recent Labs Lab 05/12/16 1828 05/12/16 1835  NA 139 137  K 3.9 4.0  CL 110 110  CO2 22  --   BUN 12 14  CREATININE 1.47* 1.40*  GLUCOSE 147* 146*    Electrolytes  Recent Labs Lab 05/12/16 1828  CALCIUM 9.2    CBC  Recent Labs Lab 05/12/16 1828 05/12/16 1835  WBC 10.7*  --   HGB 16.0 16.3  HCT 46.8 48.0  PLT 247  --     Coag's  Recent Labs Lab 05/12/16 1828  APTT 27  INR 1.22    Sepsis Markers No results for input(s): LATICACIDVEN, PROCALCITON, O2SATVEN in the last 168 hours.  ABG No results for input(s): PHART, PCO2ART, PO2ART in the last 168 hours.  Liver Enzymes  Recent Labs Lab 05/12/16 1828  AST 19  ALT 22  ALKPHOS 67  BILITOT 1.1  ALBUMIN 4.1    Cardiac Enzymes No results for input(s): TROPONINI, PROBNP in the last 168 hours.  Glucose  Recent Labs Lab 05/12/16 1837  GLUCAP 174*    Imaging Ct Angio Head W/cm &/or Wo Cm  05/12/2016  CLINICAL DATA:  Acute onset of left-sided weakness. Stroke syndrome. EXAM: CT ANGIOGRAPHY HEAD AND  NECK TECHNIQUE: Multidetector CT imaging of the head and neck was performed using the standard protocol during bolus administration of intravenous contrast. Multiplanar CT image reconstructions and MIPs were obtained to evaluate the vascular anatomy. Carotid stenosis measurements (when applicable) are obtained utilizing NASCET criteria, using the distal internal carotid diameter as the denominator. CONTRAST:  50 cc Isovue 370 COMPARISON:  CT earlier same day FINDINGS: CTA NECK Aortic arch: Mild atherosclerosis of the aortic arch. No aneurysm or dissection. Branching pattern is normal, with the variation of the left vertebral artery arising directly from the arch. Right carotid system: Common carotid artery widely patent to the bifurcation. There is advanced atherosclerosis at the  right carotid bifurcation with some soft plaque in the ICA bulb and occlusion of the ICA in the bulb. No antegrade flow beyond the bulb. No ECA stenosis. Left carotid system: Common carotid artery widely patent to the bifurcation. Mild atherosclerotic disease at the carotid bifurcation. Soft plaque at the ICA bulb region with minimal diameter of 3 mm. This is consistent with a 40% stenosis. Vertebral arteries:Both vertebral arteries widely patent at their origins and through the cervical region. As noted, the left vertebral artery arises from the arch. Skeleton: Ordinary mild spondylosis. Other neck: No soft tissue lesion. Lung apices clear. Mild emphysema. CTA HEAD Anterior circulation: No antegrade flow in the right internal carotid artery at the skullbase level. Reconstituted flow within the carotid siphon. Atherosclerotic calcification in the siphon but no stenosis in that region. Right anterior cerebral artery is widely patent. Patent anterior communicating artery. Left M1 segment is widely patent. At the MCA bifurcation, I think there is a missing branch. There appear to be missing branch vessels in the right parietal region. Left ICA is widely patent at the skull base and through the siphon region. Left anterior and middle cerebral vessels appear widely patent. Posterior circulation: Both vertebral arteries are widely patent to the basilar. No basilar stenosis. Posterior circulation branch vessels intact. Venous sinuses: Patent and normal Anatomic variants: None significant Delayed phase: No abnormal enhancement. Question early loss of gray-white differentiation in the right insular and parietal region. 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 No antegrade flow beyond that in the neck. Reconstitution at the carotid siphon region probably from external to internal collaterals and flow from a patent anterior communicating artery. I think there is probably a missing branch vessel of the right MCA  bifurcation with diminished distal vessel supply in the upper insular and parietal region on the right. Atherosclerotic plaque in the ICA bulb region on the left with 40% stenosis. Critical Value/emergent results were discussed in person at the time of interpretation on 05/12/2016 at 7:00 pm to Dr. Roland Rack , who verbally acknowledged these results. Electronically Signed   By: Nelson Chimes M.D.   On: 05/12/2016 19:26   Ct Head Wo Contrast  05/13/2016  CLINICAL DATA:  Follow-up code stroke. RIGHT MCA occlusion. Status post revascularization of RIGHT internal carotid artery occlusion. EXAM: CT HEAD WITHOUT CONTRAST TECHNIQUE: Contiguous axial images were obtained from the base of the skull through the vertex without intravenous contrast. COMPARISON:  CT HEAD May 12, 2016 at 1816 hours FINDINGS: INTRACRANIAL CONTENTS: The ventricles and sulci are normal for age. No intraparenchymal hemorrhage, mass effect nor midline shift. Small area loss of RIGHT insular gray-white matter differentiation associated with focally dense RIGHT insular middle cerebral artery. Residual intravascular contrast. No abnormal extra-axial fluid collections. Basal cisterns are patent. Mild calcific atherosclerosis of the carotid  siphons. ORBITS: The included ocular globes and orbital contents are non-suspicious. SINUSES: Moderate paranasal sinusitis with RIGHT maxillary mucoperiosteal reaction. Mastoid air cells are well aerated. SKULL/SOFT TISSUES: No skull fracture. No significant soft tissue swelling. IMPRESSION: Small acute RIGHT MCA territory infarct involving the insula with focally dense RIGHT insular MCA concerning for thromboembolism. These results will be called to the ordering clinician or representative by the Radiologist Assistant, and communication documented in the PACS or zVision Dashboard. Electronically Signed   By: Elon Alas M.D.   On: 05/13/2016 01:21   Ct Head Wo Contrast  05/12/2016  CLINICAL DATA:   Left-sided weakness which occurred while working in the yard. EXAM: CT HEAD WITHOUT CONTRAST TECHNIQUE: Contiguous axial images were obtained from the base of the skull through the vertex without intravenous contrast. COMPARISON:  None. FINDINGS: No brain atrophy. No definite acute finding. Tiny calcifications in the basal ganglia bilaterally. One could question early loss of gray-white differentiation in the deep insula on the right. This is not definite. I would give this either a 9 or 10 aspects score. No mass lesion, hydrocephalus or extra-axial collection. No calvarial abnormality. Mild inflammatory changes of the paranasal sinuses. IMPRESSION: No definite acute finding. Normal versus early loss of gray-white differentiation in the deep insula on the right. This is either aspects 9 or aspects 10. These results were called by telephone at the time of interpretation on 05/12/2016 at 6:31 pm to Dr. Leonel Ramsay, who verbally acknowledged these results. Electronically Signed   By: Nelson Chimes M.D.   On: 05/12/2016 18:40   Ct Angio Neck W/cm &/or Wo/cm  05/12/2016  CLINICAL DATA:  Acute onset of left-sided weakness. Stroke syndrome. EXAM: CT ANGIOGRAPHY HEAD AND NECK TECHNIQUE: Multidetector CT imaging of the head and neck was performed using the standard protocol during bolus administration of intravenous contrast. Multiplanar CT image reconstructions and MIPs were obtained to evaluate the vascular anatomy. Carotid stenosis measurements (when applicable) are obtained utilizing NASCET criteria, using the distal internal carotid diameter as the denominator. CONTRAST:  50 cc Isovue 370 COMPARISON:  CT earlier same day FINDINGS: CTA NECK Aortic arch: Mild atherosclerosis of the aortic arch. No aneurysm or dissection. Branching pattern is normal, with the variation of the left vertebral artery arising directly from the arch. Right carotid system: Common carotid artery widely patent to the bifurcation. There is  advanced atherosclerosis at the right carotid bifurcation with some soft plaque in the ICA bulb and occlusion of the ICA in the bulb. No antegrade flow beyond the bulb. No ECA stenosis. Left carotid system: Common carotid artery widely patent to the bifurcation. Mild atherosclerotic disease at the carotid bifurcation. Soft plaque at the ICA bulb region with minimal diameter of 3 mm. This is consistent with a 40% stenosis. Vertebral arteries:Both vertebral arteries widely patent at their origins and through the cervical region. As noted, the left vertebral artery arises from the arch. Skeleton: Ordinary mild spondylosis. Other neck: No soft tissue lesion. Lung apices clear. Mild emphysema. CTA HEAD Anterior circulation: No antegrade flow in the right internal carotid artery at the skullbase level. Reconstituted flow within the carotid siphon. Atherosclerotic calcification in the siphon but no stenosis in that region. Right anterior cerebral artery is widely patent. Patent anterior communicating artery. Left M1 segment is widely patent. At the MCA bifurcation, I think there is a missing branch. There appear to be missing branch vessels in the right parietal region. Left ICA is widely patent at the skull base and through  the siphon region. Left anterior and middle cerebral vessels appear widely patent. Posterior circulation: Both vertebral arteries are widely patent to the basilar. No basilar stenosis. Posterior circulation branch vessels intact. Venous sinuses: Patent and normal Anatomic variants: None significant Delayed phase: No abnormal enhancement. Question early loss of gray-white differentiation in the right insular and parietal region. 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 No antegrade flow beyond that in the neck. Reconstitution at the carotid siphon region probably from external to internal collaterals and flow from a patent anterior communicating artery. I think there is probably a missing  branch vessel of the right MCA bifurcation with diminished distal vessel supply in the upper insular and parietal region on the right. Atherosclerotic plaque in the ICA bulb region on the left with 40% stenosis. Critical Value/emergent results were discussed in person at the time of interpretation on 05/12/2016 at 7:00 pm to Dr. Roland Rack , who verbally acknowledged these results. Electronically Signed   By: Nelson Chimes M.D.   On: 05/12/2016 19:26     STUDIES:  Angiogram 6/11  CULTURES: None  ANTIBIOTICS: None  SIGNIFICANT EVENTS: Taken to neuroradiology 6/11, s/p stent + catheter directed tPA.  LINES/TUBES: 7.5 mm ETT 6/10>> PIV x2 Foley 6/10>> R femoral arterial sheath 6/10?>>  DISCUSSION: 65 y/o man with hx of tobacco use presenting with acute stroke  ASSESSMENT / PLAN:  PULMONARY A: Need for mechanical ventilation Tobacco abuse P:   Maintain on PRVC o/n SBT in AM Counseling on tobacco use once extubated.  CARDIOVASCULAR A:  Maintain SBP 120-140 P:  Labetolol PRN  RENAL A:   Mild renal insuffiencey, unclear chronicity P:   S/p contrast from angiogram as well as CT study. At risk for contrast nephropathy. Monitor, maintain good hydration to avoid ATN.  GASTROINTESTINAL A:   No active issues P:    HEMATOLOGIC A:   S/p tPA P:  Maintain precautions, tx per primary team  INFECTIOUS A:   No issues P:    ENDOCRINE A:   No issues P:   F/u HbA1c  NEUROLOGIC A:   Acute stroke, s/p tPA as well as R ICA stent P:   Management per primary team Sedation with propofol, prn versed RASS goal: 0   FAMILY  - Updates:   - Inter-disciplinary family meet or Palliative Care meeting due by:  June 18   CRITICAL CARE: The patient is critically ill with multiple organ systems failure and requires high complexity decision making for assessment and support, frequent evaluation and titration of therapies, application of advanced monitoring  technologies and extensive interpretation of multiple databases. Critical Care Time devoted to patient care services described in this note is 30 minutes.   Luz Brazen, MD [facility]monary [facility] [place] Pager: [phone]  05/13/2016, 2:14 AM

## 2016-05-13 NOTE — Progress Notes (Signed)
Decreased FIO2 to 30% due to stable sats

## 2016-05-13 NOTE — Progress Notes (Signed)
STROKE TEAM PROGRESS NOTE   HISTORY OF PRESENT ILLNESS (per record) Johnathan [name] is a 65 y.o. male who was in his normal state of health earlier working outside in the long. He had a syncopal episode At 1505.EMS was called and arrived on scene for the syncope. After their arrival, at 1528 he developed severe left-sided weakness.They called a code stroke and transported him to the ER where he was found to have a left hemiparesis with neglect. Review of old medical records indicate previous tobacco history as well as history of diabetes.   Dr Estanislado Pandy: CEREBRAL Johnathan [name] (Custom)]      Expand All Collapse All   S/P bilateral common carotid and RT Vert A angiograms followed by superselective infusion of 5mg  of tpa and endovascul;ar revascularization of symptomatic occluded RT ICA prox with stent assisted angioplasty ,and infusion of 18 mg of IA Integrelin and 9 mg of REopro into intrastent thrombosis with gradual clearance of filling defects       LKW: 17:05 tpa given?: yes Premorbid modified rankin scale: 0   SUBJECTIVE (INTERVAL HISTORY) His family was not at the bedside. Overall he feels his condition is unchanged. Discussed the case with RN at the bedside.  Despite Propofol, patient remains moderately agitated.  She has restrained the right lower extremity for safety given the sheath in place.  Cardene off at this time.  RN advised to use prn Fentanyl order   OBJECTIVE Temp:  [96.7 F (35.9 C)-98.1 F (36.7 C)] 96.7 F (35.9 C) (06/11 0800) Pulse Rate:  [58-97] 67 (06/11 0800) Cardiac Rhythm:  [-] Normal sinus rhythm (06/11 0800) Resp:  [12-23] 12 (06/11 0800) BP: (82-194)/(52-106) 136/83 mmHg (06/11 0800) SpO2:  [95 %-100 %] 100 % (06/11 0800) Arterial Line BP: (122-182)/(72-105) 176/103 mmHg (06/11 0800) FiO2 (%):  [40 %] 40 % (06/11 0800) Weight:  [78.7 kg (173 lb 8 oz)] 78.7 kg (173 lb 8 oz) (06/11 0058)  CBC:   Recent Labs Lab 05/12/16 1828 05/12/16 1835  05/13/16 0210  WBC 10.7*  --  9.5  NEUTROABS 8.5*  --  7.1  HGB 16.0 16.3 13.8  HCT 46.8 48.0 40.9  MCV 94.0  --  95.1  PLT 247  --  [id number]    Basic Metabolic Panel:   Recent Labs Lab 05/12/16 1828 05/12/16 1835 05/13/16 0210  NA 139 137 139  K 3.9 4.0 3.8  CL 110 110 108  CO2 22  --  24  GLUCOSE 147* 146* 113*  BUN 12 14 10   CREATININE 1.47* 1.40* 1.25*  CALCIUM 9.2  --  7.9*    Lipid Panel:     Component Value Date/Time   CHOL 127 05/13/2016 0210   TRIG 136 05/13/2016 0210   HDL 30* 05/13/2016 0210   CHOLHDL 4.2 05/13/2016 0210   VLDL 27 05/13/2016 0210   LDLCALC 70 05/13/2016 0210   HgbA1c: No results found for: HGBA1C Urine Drug Screen: No results found for: LABOPIA, COCAINSCRNUR, LABBENZ, AMPHETMU, THCU, LABBARB    IMAGING  Ct Angio Head and Neck W/cm &/or Wo Cm 05/12/2016   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 No antegrade flow beyond that in the neck. Reconstitution at the carotid siphon region probably from external to internal collaterals and flow from a patent anterior communicating artery. I think there is probably a missing branch vessel of the right MCA bifurcation with diminished distal vessel supply in the upper insular and parietal region on the right. Atherosclerotic plaque  in the ICA bulb region on the left with 40% stenosis.    Ct Head Wo Contrast 05/13/2016   Small acute RIGHT MCA territory infarct involving the insula with focally dense RIGHT insular MCA concerning for thromboembolism.    Ct Head Wo Contrast 05/12/2016   No definite acute finding. Normal versus early loss of gray-white differentiation in the deep insula on the right. This is either aspects 9 or aspects 10.    Portable Chest Xray 05/13/2016   Endotracheal tube above the carina.    PHYSICAL EXAM Physical Exam  Constitutional: Appears well-developed and well-nourished.  Patient intubated and sedated Eyes: No scleral injection HENT: No OP obstrucion Head: Normocephalic.   Cardiovascular: Normal rate and regular rhythm.  Respiratory: CTA GI: Soft. No distension.   Skin: WDI  Neuro: Mental Status: Patient is sedated on Propofol however, follows some simple commands.   Cranial Nerves: II: Pupils are equal, round, and reactive to light.  III,IV, VI: EOMI with OCR V: Facial sensation can not be tested VII: Facial movement appears weak on the left when observing eye closure.  VIII: hearing is intact to voice IX, X:  Spontaneous cough XI: XII: not tested   Motor/Sensory Tone is normal. Bulk is normal. On the right side. Follows commands.  On left no movement in UE and withdraws to noxious stimulation in the lower extremity   Cerebellar/Gait Not tested   ASSESSMENT/PLAN Johnathan [name] is a 65 y.o. male with history of diabetes mellitus and tobacco history presenting with syncope and left hemiparesthesias. He did receive IV t-PA Saturday, 05/12/2016 at 1845.  Stroke:  Non-dominant infarct embolic from an unknown source.  Resultant  Left sided weakness  MRI - pending  MRA  not performed  CTA head and neck - 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 No antegrade flow beyond that in the neck.  Carotid Doppler - refer to CTA of the neck.  2D Echo - pending  LDL - 70  HgbA1c pending  VTE prophylaxis - SCDs Diet NPO time specified  No antithrombotic prior to admission, now on No antithrombotic  Patient counseled to be compliant with his antithrombotic medications  Ongoing aggressive stroke risk factor management  Therapy recommendations:  Pending  Disposition:  Pending  Hypertension  Blood pressure tends to run low  Permissive hypertension (OK if < 220/120) but gradually normalize in 5-7 days  Long-term BP goal normotensive  Hyperlipidemia  Home meds:  No lipid lowering medications prior to admission  LDL 70, goal < 70   Diabetes  HgbA1c pending, goal < 7.0  Controlled  Other Stroke Risk Factors  Advanced  age  Cigarette smoker - advised to stop smoking  Obesity, Body mass index is 28.02 kg/(m^2)., recommend weight loss, diet and exercise as appropriate   Family hx stroke    Other Active Problems  Creatinine 1.25  Hospital day # 1  Mikey Bussing PA-C [facility] Pager [phone] 05/13/2016, 9:16 AM  CRITICAL CARE NEUROLOGY ATTENDING NOTE Patient was seen and examined by me personally. I independently viewed imaging studies, participated in medical decision making and plan of care. The laboratory and radiographic studies were personally reviewed by me.  ROS pertinent positives could not be fully documented due to LOC  Assessment and plan completed by me personally and fully documented above.  Condition is unchanged since procedure   This patient is critically ill and at significant risk of neurological worsening, death and care requires constant monitoring of  vital signs, hemodynamics,respiratory and cardiac monitoring, extensive review of multiple databases, frequent neurological assessment, discussion with family, other specialists and medical decision making of high complexity.  This critical care time does not reflect procedure time, or teaching time or supervisory time of PA/NP/Med Resident etc. but could involve care discussion time.  I spent 30 minutes of Neurocritical Care time in the care of  this patient.  SIGNED BY: Dr. Elissa Hefty      To contact Stroke Continuity provider, please refer to [email or web address] After hours, contact General Neurology

## 2016-05-13 NOTE — Progress Notes (Addendum)
RN called Dr. Estanislado Pandy to confirm SBP goals for patient. Dr. Estanislado Pandy stated he wanted SBP cuff to be between 120-140. RN will continue to monitor.

## 2016-05-13 NOTE — Transfer of Care (Signed)
Immediate Anesthesia Transfer of Care Note  Patient: Johnathan [name]  Procedure(s) Performed: Procedure(s): RADIOLOGY WITH ANESTHESIA (N/A)  Patient Location: PACU  Anesthesia Type:General  Level of Consciousness: Patient remains intubated per anesthesia plan  Airway & Oxygen Therapy: Patient remains intubated per anesthesia plan and Patient placed on Ventilator (see vital sign flow sheet for setting)  Post-op Assessment: Report given to RN and Post -op Vital signs reviewed and stable  Post vital signs: Reviewed and stable  Last Vitals:  Filed Vitals:   05/12/16 2030 05/13/16 0000  BP: 179/102 144/93  Pulse: 79 90  Temp:    Resp: 19 15    Last Pain:  Filed Vitals:   05/13/16 0043  PainSc: 0-No pain         Complications: No apparent anesthesia complications

## 2016-05-13 NOTE — Progress Notes (Signed)
Echocardiogram 2D Echocardiogram has been performed.  Johnathan [name] 05/13/2016, 11:24 AM

## 2016-05-13 NOTE — Progress Notes (Addendum)
*  PRELIMINARY RESULTS* Vascular Ultrasound Stat Carotid Duplex (Doppler) has been completed. There is no obvious evidence of hemodynamically significant left internal carotid artery stenosis. The right internal carotid artery stent was visualized, and exhibited elevated velocities within the stent suggestive of 50-75% stenosis. The distal right internal carotid artery exhibits elevated velocities suggestive of >75% stenosis. Bilateral vertebral arteries are patent with antegrade flow.  05/13/2016 12:22 PM Maudry Mayhew, RVT, RDCS, RDMS

## 2016-05-13 NOTE — Progress Notes (Signed)
Transported pt on vent to and from MRI with no complications

## 2016-05-14 ENCOUNTER — Encounter (HOSPITAL_COMMUNITY): Payer: Self-pay | Admitting: Interventional Radiology

## 2016-05-14 DIAGNOSIS — G9340 Encephalopathy, unspecified: Secondary | ICD-10-CM

## 2016-05-14 DIAGNOSIS — I10 Essential (primary) hypertension: Secondary | ICD-10-CM

## 2016-05-14 DIAGNOSIS — J9601 Acute respiratory failure with hypoxia: Secondary | ICD-10-CM

## 2016-05-14 DIAGNOSIS — I63031 Cerebral infarction due to thrombosis of right carotid artery: Principal | ICD-10-CM

## 2016-05-14 LAB — HEMOGLOBIN A1C
Hgb A1c MFr Bld: 5.6 % (ref 4.8–5.6)
Mean Plasma Glucose: 114 mg/dL

## 2016-05-14 MED ORDER — IOPAMIDOL (ISOVUE-370) INJECTION 76%
50.0000 mL | Freq: Once | INTRAVENOUS | Status: AC | PRN
  Administered 2016-05-12: 50 mL via INTRAVENOUS

## 2016-05-14 MED ORDER — CETYLPYRIDINIUM CHLORIDE 0.05 % MT LIQD
7.0000 mL | Freq: Two times a day (BID) | OROMUCOSAL | Status: DC
  Administered 2016-05-16: 7 mL via OROMUCOSAL

## 2016-05-14 MED ORDER — NICOTINE 14 MG/24HR TD PT24
14.0000 mg | MEDICATED_PATCH | Freq: Every day | TRANSDERMAL | Status: DC
  Administered 2016-05-14 – 2016-05-16 (×3): 14 mg via TRANSDERMAL
  Filled 2016-05-14 (×3): qty 1

## 2016-05-14 MED ORDER — PHENOL 1.4 % MT LIQD
1.0000 | OROMUCOSAL | Status: DC | PRN
  Administered 2016-05-14: 1 via OROMUCOSAL
  Filled 2016-05-14: qty 177

## 2016-05-14 NOTE — Progress Notes (Signed)
Report given to 40M.   Pt transferred to [id number] with no complications. Last BP 149/83 (MAP102). NIH 5. Pt a/o x4. Wife called and updated. No personal belongings at bedside. Nurse at bedside and updated.

## 2016-05-14 NOTE — Progress Notes (Signed)
STROKE TEAM PROGRESS NOTE   SUBJECTIVE (INTERVAL HISTORY) His wife is at the bedside. He remains intubated.Neck  Collar on. NT working with bathing him. RN reports he is following commands. Spontaneously moving the R side. For extubation today, take off collar.   OBJECTIVE Temp:  [98.4 F (36.9 C)-99.7 F (37.6 C)] 99 F (37.2 C) (06/12 1200) Pulse Rate:  [56-99] 74 (06/12 1300) Cardiac Rhythm:  [-] Normal sinus rhythm (06/12 0800) Resp:  [13-24] 16 (06/12 1300) BP: (109-166)/(69-89) 159/89 mmHg (06/12 1300) SpO2:  [95 %-100 %] 99 % (06/12 1300) FiO2 (%):  [30 %-40 %] 40 % (06/12 1200)  CBC:   Recent Labs Lab 05/12/16 1828 05/12/16 1835 05/13/16 0210  WBC 10.7*  --  9.5  NEUTROABS 8.5*  --  7.1  HGB 16.0 16.3 13.8  HCT 46.8 48.0 40.9  MCV 94.0  --  95.1  PLT 247  --  [id number]    Basic Metabolic Panel:   Recent Labs Lab 05/12/16 1828 05/12/16 1835 05/13/16 0210  NA 139 137 139  K 3.9 4.0 3.8  CL 110 110 108  CO2 22  --  24  GLUCOSE 147* 146* 113*  BUN 12 14 10   CREATININE 1.47* 1.40* 1.25*  CALCIUM 9.2  --  7.9*    Lipid Panel:     Component Value Date/Time   CHOL 127 05/13/2016 0210   TRIG 136 05/13/2016 0210   HDL 30* 05/13/2016 0210   CHOLHDL 4.2 05/13/2016 0210   VLDL 27 05/13/2016 0210   LDLCALC 70 05/13/2016 0210   HgbA1c:  Lab Results  Component Value Date   HGBA1C 5.6 05/13/2016   Urine Drug Screen:     Component Value Date/Time   LABOPIA NONE DETECTED 05/13/2016 0940   COCAINSCRNUR NONE DETECTED 05/13/2016 0940   LABBENZ POSITIVE* 05/13/2016 0940   AMPHETMU NONE DETECTED 05/13/2016 0940   THCU NONE DETECTED 05/13/2016 0940   LABBARB NONE DETECTED 05/13/2016 0940      IMAGING  Ct Head Wo Contrast 05/13/2016   Small acute RIGHT MCA territory infarct involving the insula with focally dense RIGHT insular MCA concerning for thromboembolism.  05/12/2016   No definite acute finding. Normal versus early loss of gray-white differentiation in  the deep insula on the right. This is either aspects 9 or aspects 10.   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 No antegrade flow beyond that in the neck. Reconstitution at the carotid siphon region probably from external to internal collaterals and flow from a patent anterior communicating artery. I think there is probably a missing branch vessel of the right MCA bifurcation with diminished distal vessel supply in the upper insular and parietal region on the right. Atherosclerotic plaque in the ICA bulb region on the left with 40% stenosis.   Cerebral angiogram S/P bilateral common carotid and RT Vert A angiograms followed by superselective infusion of 5mg  of tpa and endovascul;ar revascularization of symptomatic occluded RT ICA prox with stent assisted angioplasty ,and infusion of 18 mg of IA Integrelin and 9 mg of REopro into intrastent thrombosis with gradual clearance of filling defects   MRI brain 05/13/2016  1. Extensive acute/subacute infarct involving the posterior right MCA territory. This includes much of the right primary motor cortex. 2. Additional involvement of the medial posterior right frontal lobe along the posterior cingulate gyrus. 3. Remote infarct of the right occipital lobe. 4. Extensive sinus disease likely  related to intubation.  2D Echocardiogram  - Left ventricle: The cavity size was normal. Systolic function was  normal. The estimated ejection fraction was in the range of 55% to 60%. Wall motion was normal; there were no regional wall motion abnormalities. Left ventricular diastolic function parameters were normal. - Atrial septum: No defect or patent foramen ovale was identified.  Impressions:  - No cardiac source of emboli was indentified.  Portable Chest Xray 05/13/2016   Endotracheal tube above the carina.    PHYSICAL EXAM Constitutional: Appears well-developed and well-nourished.  Patient intubated and  sedated Eyes: No scleral injection HENT: No OP obstrucion Head: Normocephalic.  Cardiovascular: Normal rate and regular rhythm.  Respiratory: CTA GI: Soft. No distension.   Skin: WDI  Neuro: Mental Status: Patient is sedated on Propofol however, follows some simple commands.on both sides   Cranial Nerves: II: Pupils are equal, round, and reactive to light.  III,IV, VI: EOMI with OCR V: Facial sensation can not be tested VII: Facial movement appears weak on the left when observing eye closure.  VIII: hearing is intact to voice IX, X:  Spontaneous cough XI: XII: not tested   Motor/Sensory Tone is normal. Bulk is normal. On the right side. Follows commands.  On left mild drift LUE and LLE. Grade 3/5 strength lUE and 4/5 LLE. Cerebellar/Gait Not tested   ASSESSMENT/PLAN Johnathan [name] is a 65 y.o. male with history of diabetes mellitus and tobacco history presenting with syncope and left hemiparesthesias. He  received IV t-PA Saturday, 05/12/2016 at [place] and was taken to IR where he had revascularization with IA tPA, integrilin and reapro with stent to occluded R ICA.   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 Infarct thrombotic from large vessel disease.  Resultant  Left sided weakness  MRI - extensive posterior R MCA territory infarct (~30% of the brain )  CTA head and neck - 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 No antegrade flow beyond that in the neck.  Carotid Doppler - refer to CTA of the neck.  2D Echo - EF 55-560%. No source of embolus  LDL - 70  HgbA1c 5.6  VTE prophylaxis - SCDs Diet NPO time specified  No antithrombotic prior to admission, now on aspirin 325 mg daily. Once able to swallow, Given large vessel intracranial atherosclerosis, patient should be treated with aspirin 81 mg and clopidogrel 75 mg orally every day x 3 months for secondary stroke prevention. After 3 months,  change to plavix alone. Long-term dual antiplatelets are contraindicated due to risk for intracerebral hemorrhage.   Ongoing aggressive stroke risk factor management  Therapy recommendations:  pending   Disposition:  pending (Pt lives w. Wife, retired from [employer], no real medical care PTA)  Acute respiratory failure  Intubated for neuro intervention  Remains intubated as he needed to remain flat and was uncooperative  Ok for extubation today  CCM following  Carotid occlusion  R ICA occlusion  S/p emergent stent placement in IR  Collar on - d/c if ok with IR today  Hypertension  Blood pressure stablized  Permissive hypertension (OK if < 220/120) but gradually normalize in 5-7 days  Long-term BP goal normotensive  Hyperlipidemia  Home meds:  No lipid lowering medications prior to admission  LDL 70, goal < 70  Consider statin once able to swallow  Other Stroke Risk Factors  Advanced age  Cigarette smoker - advised to stop  smoking. 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 I have made any additions or clarifications directly to the above note. Agree with note above.  I had a long discussion with the patient's wife regarding his neurological presentation, plan for evaluation, treatment and answered questions. Recommend continue aspirin and Plavix. Extubate today if tolerated. Check swallow swallow eval later. This patient is critically ill and at significant risk of neurological worsening, death and care requires constant  monitoring of vital signs, hemodynamics,respiratory and cardiac monitoring, extensive review of multiple databases, frequent neurological assessment, discussion with family, other specialists and medical decision making of high complexity.I have made any additions or clarifications directly to the above note.This critical care time does not reflect procedure time, or teaching time or supervisory time of PA/NP/Med Resident etc but could involve care discussion time.  I spent 30 minutes of neurocritical care time  in the care of  this patient.      Antony Contras, MD Medical Director [facility] Pager: [phone] 05/14/2016 4:34 PM    To contact Stroke Continuity provider, please refer to [email or web address] After hours, contact General Neurology

## 2016-05-14 NOTE — Progress Notes (Signed)
PT Cancellation Note  Patient Details Name: Johnathan [name] MRN: [id number] DOB: 1951/04/11   Cancelled Treatment:    Reason Eval/Treat Not Completed: Patient not medically ready (active bedrest orders)   Duncan Dull 05/14/2016, 7:00 AM Alben Deeds, [place] DPT  [phone]

## 2016-05-14 NOTE — Progress Notes (Signed)
SLP Cancellation Note  Patient Details Name: Johnathan [name] MRN: [id number] DOB: 28-Jun-1951   Cancelled treatment:       Reason Eval/Treat Not Completed: Patient not medically ready   Ossie Yebra, Katherene Ponto 05/14/2016, 7:33 AM

## 2016-05-14 NOTE — Progress Notes (Signed)
Referring Physician(s): Dr Roland Rack  Supervising Physician: Luanne Bras  Patient Status: In-pt  Chief Complaint:  CVA R ICA pta/stent 6/11 early am  Subjective:  Extubated today Moving all 4s Left slower and weaker than Rt Answers me Follows me Follows all commands   Allergies: Review of patient's allergies indicates no known allergies.  Medications: Prior to Admission medications   Medication Sig Start Date End Date Taking? Authorizing Provider  omeprazole (PRILOSEC OTC) 20 MG tablet Take 20 mg by mouth every morning.   Yes Historical Provider, MD     Vital Signs: BP 159/89 mmHg  Pulse 74  Temp(Src) 99 F (37.2 C) (Axillary)  Resp 16  Ht 5\' 6"  (1.676 m)  Wt 173 lb 8 oz (78.7 kg)  BMI 28.02 kg/m2  SpO2 99%  Physical Exam  Constitutional: He is oriented to person, place, and time.  Cardiovascular: Normal rate.   Pulmonary/Chest: Effort normal.  Abdominal: Soft.  Musculoskeletal: Normal range of motion.  Moves all 4s L slower than Rt  Neurological: He is alert and oriented to person, place, and time.  Answers me Follows me Follow all commands  Skin: Skin is warm and dry.  Rt groin NT no bleeding No hematoma Rt foot 2+ pulses  Psychiatric: He has a normal mood and affect.  Nursing note and vitals reviewed.   Imaging: Ct Angio Head W/cm &/or Wo Cm  05/12/2016  CLINICAL DATA:  Acute onset of left-sided weakness. Stroke syndrome. EXAM: CT ANGIOGRAPHY HEAD AND NECK TECHNIQUE: Multidetector CT imaging of the head and neck was performed using the standard protocol during bolus administration of intravenous contrast. Multiplanar CT image reconstructions and MIPs were obtained to evaluate the vascular anatomy. Carotid stenosis measurements (when applicable) are obtained utilizing NASCET criteria, using the distal internal carotid diameter as the denominator. CONTRAST:  50 cc Isovue 370 COMPARISON:  CT earlier same day FINDINGS: CTA NECK  Aortic arch: Mild atherosclerosis of the aortic arch. No aneurysm or dissection. Branching pattern is normal, with the variation of the left vertebral artery arising directly from the arch. Right carotid system: Common carotid artery widely patent to the bifurcation. There is advanced atherosclerosis at the right carotid bifurcation with some soft plaque in the ICA bulb and occlusion of the ICA in the bulb. No antegrade flow beyond the bulb. No ECA stenosis. Left carotid system: Common carotid artery widely patent to the bifurcation. Mild atherosclerotic disease at the carotid bifurcation. Soft plaque at the ICA bulb region with minimal diameter of 3 mm. This is consistent with a 40% stenosis. Vertebral arteries:Both vertebral arteries widely patent at their origins and through the cervical region. As noted, the left vertebral artery arises from the arch. Skeleton: Ordinary mild spondylosis. Other neck: No soft tissue lesion. Lung apices clear. Mild emphysema. CTA HEAD Anterior circulation: No antegrade flow in the right internal carotid artery at the skullbase level. Reconstituted flow within the carotid siphon. Atherosclerotic calcification in the siphon but no stenosis in that region. Right anterior cerebral artery is widely patent. Patent anterior communicating artery. Left M1 segment is widely patent. At the MCA bifurcation, I think there is a missing branch. There appear to be missing branch vessels in the right parietal region. Left ICA is widely patent at the skull base and through the siphon region. Left anterior and middle cerebral vessels appear widely patent. Posterior circulation: Both vertebral arteries are widely patent to the basilar. No basilar stenosis. Posterior circulation branch vessels intact. Venous sinuses: Patent  and normal Anatomic variants: None significant Delayed phase: No abnormal enhancement. Question early loss of gray-white differentiation in the right insular and parietal region.  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 No antegrade flow beyond that in the neck. Reconstitution at the carotid siphon region probably from external to internal collaterals and flow from a patent anterior communicating artery. I think there is probably a missing branch vessel of the right MCA bifurcation with diminished distal vessel supply in the upper insular and parietal region on the right. Atherosclerotic plaque in the ICA bulb region on the left with 40% stenosis. Critical Value/emergent results were discussed in person at the time of interpretation on 05/12/2016 at 7:00 pm to Dr. Roland Rack , who verbally acknowledged these results. Electronically Signed   By: Nelson Chimes M.D.   On: 05/12/2016 19:26   Ct Head Wo Contrast  05/13/2016  CLINICAL DATA:  Follow-up code stroke. RIGHT MCA occlusion. Status post revascularization of RIGHT internal carotid artery occlusion. EXAM: CT HEAD WITHOUT CONTRAST TECHNIQUE: Contiguous axial images were obtained from the base of the skull through the vertex without intravenous contrast. COMPARISON:  CT HEAD May 12, 2016 at 1816 hours FINDINGS: INTRACRANIAL CONTENTS: The ventricles and sulci are normal for age. No intraparenchymal hemorrhage, mass effect nor midline shift. Small area loss of RIGHT insular gray-white matter differentiation associated with focally dense RIGHT insular middle cerebral artery. Residual intravascular contrast. No abnormal extra-axial fluid collections. Basal cisterns are patent. Mild calcific atherosclerosis of the carotid siphons. ORBITS: The included ocular globes and orbital contents are non-suspicious. SINUSES: Moderate paranasal sinusitis with RIGHT maxillary mucoperiosteal reaction. Mastoid air cells are well aerated. SKULL/SOFT TISSUES: No skull fracture. No significant soft tissue swelling. IMPRESSION: Small acute RIGHT MCA territory infarct involving the insula with focally dense RIGHT insular MCA concerning for  thromboembolism. These results will be called to the ordering clinician or representative by the Radiologist Assistant, and communication documented in the PACS or zVision Dashboard. Electronically Signed   By: Elon Alas M.D.   On: 05/13/2016 01:21   Ct Head Wo Contrast  05/12/2016  CLINICAL DATA:  Left-sided weakness which occurred while working in the yard. EXAM: CT HEAD WITHOUT CONTRAST TECHNIQUE: Contiguous axial images were obtained from the base of the skull through the vertex without intravenous contrast. COMPARISON:  None. FINDINGS: No brain atrophy. No definite acute finding. Tiny calcifications in the basal ganglia bilaterally. One could question early loss of gray-white differentiation in the deep insula on the right. This is not definite. I would give this either a 9 or 10 aspects score. No mass lesion, hydrocephalus or extra-axial collection. No calvarial abnormality. Mild inflammatory changes of the paranasal sinuses. IMPRESSION: No definite acute finding. Normal versus early loss of gray-white differentiation in the deep insula on the right. This is either aspects 9 or aspects 10. These results were called by telephone at the time of interpretation on 05/12/2016 at 6:31 pm to Dr. Leonel Ramsay, who verbally acknowledged these results. Electronically Signed   By: Nelson Chimes M.D.   On: 05/12/2016 18:40   Ct Angio Neck W/cm &/or Wo/cm  05/12/2016  CLINICAL DATA:  Acute onset of left-sided weakness. Stroke syndrome. EXAM: CT ANGIOGRAPHY HEAD AND NECK TECHNIQUE: Multidetector CT imaging of the head and neck was performed using the standard protocol during bolus administration of intravenous contrast. Multiplanar CT image reconstructions and MIPs were obtained to evaluate the vascular anatomy. Carotid stenosis measurements (when applicable) are obtained utilizing NASCET criteria, using  the distal internal carotid diameter as the denominator. CONTRAST:  50 cc Isovue 370 COMPARISON:  CT earlier  same day FINDINGS: CTA NECK Aortic arch: Mild atherosclerosis of the aortic arch. No aneurysm or dissection. Branching pattern is normal, with the variation of the left vertebral artery arising directly from the arch. Right carotid system: Common carotid artery widely patent to the bifurcation. There is advanced atherosclerosis at the right carotid bifurcation with some soft plaque in the ICA bulb and occlusion of the ICA in the bulb. No antegrade flow beyond the bulb. No ECA stenosis. Left carotid system: Common carotid artery widely patent to the bifurcation. Mild atherosclerotic disease at the carotid bifurcation. Soft plaque at the ICA bulb region with minimal diameter of 3 mm. This is consistent with a 40% stenosis. Vertebral arteries:Both vertebral arteries widely patent at their origins and through the cervical region. As noted, the left vertebral artery arises from the arch. Skeleton: Ordinary mild spondylosis. Other neck: No soft tissue lesion. Lung apices clear. Mild emphysema. CTA HEAD Anterior circulation: No antegrade flow in the right internal carotid artery at the skullbase level. Reconstituted flow within the carotid siphon. Atherosclerotic calcification in the siphon but no stenosis in that region. Right anterior cerebral artery is widely patent. Patent anterior communicating artery. Left M1 segment is widely patent. At the MCA bifurcation, I think there is a missing branch. There appear to be missing branch vessels in the right parietal region. Left ICA is widely patent at the skull base and through the siphon region. Left anterior and middle cerebral vessels appear widely patent. Posterior circulation: Both vertebral arteries are widely patent to the basilar. No basilar stenosis. Posterior circulation branch vessels intact. Venous sinuses: Patent and normal Anatomic variants: None significant Delayed phase: No abnormal enhancement. Question early loss of gray-white differentiation in the right  insular and parietal region. 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 No antegrade flow beyond that in the neck. Reconstitution at the carotid siphon region probably from external to internal collaterals and flow from a patent anterior communicating artery. I think there is probably a missing branch vessel of the right MCA bifurcation with diminished distal vessel supply in the upper insular and parietal region on the right. Atherosclerotic plaque in the ICA bulb region on the left with 40% stenosis. Critical Value/emergent results were discussed in person at the time of interpretation on 05/12/2016 at 7:00 pm to Dr. Roland Rack , who verbally acknowledged these results. Electronically Signed   By: Nelson Chimes M.D.   On: 05/12/2016 19:26   Mr Brain Wo Contrast  05/13/2016  CLINICAL DATA:  Right ICA occlusion with acute onset of left-sided weakness. Stent assisted revascularization of the right internal carotid artery was before the with improved flow. The patient is still sedated following the procedure. EXAM: MRI HEAD WITHOUT CONTRAST TECHNIQUE: Multiplanar, multiecho pulse sequences of the brain and surrounding structures were obtained without intravenous contrast. COMPARISON:  Is procedures CT of the head 05/13/2016. FINDINGS: The diffusion-weighted images demonstrate an acute infarct involving vein posterior right insular cortex and stack passive involvement of the right postcentral gyrus. There is medium bulb on the posterior right frontal lobe along the posterior cingulate gyrus as well. Extensive T2 changes are associated with these areas of acute infarction. There are more remote cortical infarct involving the right occipital lobe. No acute hemorrhage or mass lesion is present. The ventricles are of normal size. There is local mass effect associated with the areas of acute  infarction which effaces the sulci. There is no midline shift. The internal auditory canals are within normal  limits bilaterally. The brainstem and cerebellum are normal. Flow is present in the major intracranial arteries. The globes and orbits are intact. A fluid level is present in the right maxillary sinus. Thick circumferential mucosal thickening is present as well. There is fluid in the nasopharynx likely reflecting secretions associated with intubation. The mastoid air cells are clear. The skullbase is within normal limits. Midline sagittal images are unremarkable. IMPRESSION: 1. Extensive acute/subacute infarct involving the posterior right MCA territory. This includes much of the right primary motor cortex. 2. Additional involvement of the medial posterior right frontal lobe along the posterior cingulate gyrus. 3. Remote infarct of the right occipital lobe. 4. Extensive sinus disease likely related to intubation. Electronically Signed   By: San Morelle M.D.   On: 05/13/2016 18:50   Portable Chest Xray  05/13/2016  CLINICAL DATA:  65 year old male status post intubation. EXAM: PORTABLE CHEST 1 VIEW COMPARISON:  Chest radiograph dated 09/19/2010 FINDINGS: Endotracheal tube approximately 4 cm above the carina. An enteric tube courses towards the left upper abdomen with tip beyond the inferior margin of the image. The lungs are clear. No pleural effusion or pneumothorax. The cardiac silhouette is within normal limits with no acute osseous pathology. IMPRESSION: Endotracheal tube above the carina. Electronically Signed   By: Anner Crete M.D.   On: 05/13/2016 02:22   Dg Abd Portable 1v  05/13/2016  CLINICAL DATA:  Nasogastric tube placement. EXAM: PORTABLE ABDOMEN - 1 VIEW COMPARISON:  Chest radiograph of earlier today FINDINGS: Nasogastric tube terminates the proximal stomach, with side port above the gastroesophageal junction. Nonobstructive bowel gas pattern. Low pelvis excluded. IMPRESSION: Nasogastric tube terminates at the proximal stomach with side port above the gastroesophageal junction.  Recommend advancement for optimal positioning. These results will be called to the ordering clinician or representative by the Radiologist Assistant, and communication documented in the PACS or zVision Dashboard. Electronically Signed   By: Abigail Miyamoto M.D.   On: 05/13/2016 08:52    Labs:  CBC:  Recent Labs  05/12/16 1828 05/12/16 1835 05/13/16 0210  WBC 10.7*  --  9.5  HGB 16.0 16.3 13.8  HCT 46.8 48.0 40.9  PLT 247  --  222    COAGS:  Recent Labs  05/12/16 1828  INR 1.22  APTT 27    BMP:  Recent Labs  05/12/16 1828 05/12/16 1835 05/13/16 0210  NA 139 137 139  K 3.9 4.0 3.8  CL 110 110 108  CO2 22  --  24  GLUCOSE 147* 146* 113*  BUN 12 14 10   CALCIUM 9.2  --  7.9*  CREATININE 1.47* 1.40* 1.25*  GFRNONAA 49*  --  59*  GFRAA 56*  --  >60    LIVER FUNCTION TESTS:  Recent Labs  05/12/16 1828  BILITOT 1.1  AST 19  ALT 22  ALKPHOS 67  PROT 6.8  ALBUMIN 4.1    Assessment and Plan:  CVA R ICA pta/stent 6/11 am Off vent Moving and following all commands Left slower than Rt- but moving well  Electronically Signed: Arhum Peeples A 05/14/2016, 2:25 PM   I spent a total of 15 Minutes at the the patient's bedside AND on the patient's hospital floor or unit, greater than 50% of which was counseling/coordinating care for Rt ICA pta/stent 6/11 am

## 2016-05-14 NOTE — Progress Notes (Signed)
OT Cancellation Note  Patient Details Name: BANDON CHERNE MRN: [id number] DOB: Feb 22, 1951   Cancelled Treatment:    Reason Eval/Treat Not Completed: Patient not medically ready (Bedrest/ leg sheath)  Vonita Moss   OTR/L Pager: [phone] Office: [phone] .  05/14/2016, 6:58 AM

## 2016-05-14 NOTE — Progress Notes (Signed)
PULMONARY / CRITICAL CARE MEDICINE   Name: Johnathan [name] MRN: [id number] DOB: 1951-02-05    ADMISSION DATE:  05/12/2016 CONSULTATION DATE:  May 14, 2016  REFERRING MD:  Garvin Fila, MD  CHIEF COMPLAINT:  Syncope  HISTORY OF PRESENT ILLNESS:   Johnathan [name] is a 65 y/o man with a PMHx of tobacco abuse who presented to the ED after he had syncope followed by left sided weakness. He was taken to the ED as a code stroke, and was taken to interventional neuroradiology who did superselective (5 mg) of tPA to right ICA with stent placement. This was complicated by transient instent thrombosis, but this was successfully treated with good flow. PCCM was consulted for ventilator and critical care management while in the neuro ICU.  Subjective: Agitated when not sedated, but when calm is following commands and weaning.  VITAL SIGNS: BP 160/88 mmHg  Pulse 95  Temp(Src) 99.3 F (37.4 C) (Axillary)  Resp 17  Ht 5\' 6"  (1.676 m)  Wt 78.7 kg (173 lb 8 oz)  BMI 28.02 kg/m2  SpO2 95%  HEMODYNAMICS:    VENTILATOR SETTINGS: Vent Mode:  [-] PSV;CPAP FiO2 (%):  [30 %-40 %] 40 % Set Rate:  [14 bmp] 14 bmp Vt Set:  [520 mL] 520 mL PEEP:  [5 cmH20] 5 cmH20 Pressure Support:  [5 cmH20] 5 cmH20 Plateau Pressure:  [6 cmH20-18 cmH20] 6 cmH20  INTAKE / OUTPUT: I/O last 3 completed shifts: In: 6364.8 [I.V.:5234.8; NG/GT:30; IV Piggyback:1100] Out: OE:984588; Blood:50]  PHYSICAL EXAMINATION: General:  Middle aged man in NAD  Neuro:  Arousable and following commands HEENT:  MMM, Bryan/AT, PERRL, EOM-I  Cardiovascular:  Heart sounds dual and nromal  Lungs:  CTA B Abdomen:  Soft, ND, ND and +BS. Musculoskeletal:  No deformities  Skin:  No rashes on exposed skin, significant sun exposure   LABS:  BMET  Recent Labs Lab 05/12/16 1828 05/12/16 1835 05/13/16 0210  NA 139 137 139  K 3.9 4.0 3.8  CL 110 110 108  CO2 22  --  24  BUN 12 14 10   CREATININE 1.47* 1.40* 1.25*  GLUCOSE  147* 146* 113*   Electrolytes  Recent Labs Lab 05/12/16 1828 05/13/16 0210  CALCIUM 9.2 7.9*   CBC  Recent Labs Lab 05/12/16 1828 05/12/16 1835 05/13/16 0210  WBC 10.7*  --  9.5  HGB 16.0 16.3 13.8  HCT 46.8 48.0 40.9  PLT 247  --  222   Coag's  Recent Labs Lab 05/12/16 1828  APTT 27  INR 1.22   Sepsis Markers No results for input(s): LATICACIDVEN, PROCALCITON, O2SATVEN in the last 168 hours.  ABG  Recent Labs Lab 05/13/16 0220  PHART 7.344*  PCO2ART 44.1  PO2ART 136*   Liver Enzymes  Recent Labs Lab 05/12/16 1828  AST 19  ALT 22  ALKPHOS 67  BILITOT 1.1  ALBUMIN 4.1   Cardiac Enzymes No results for input(s): TROPONINI, PROBNP in the last 168 hours.  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 Stent assisted revascularization of the right internal carotid artery was before the with improved flow. The patient is still sedated following the procedure. EXAM: MRI HEAD WITHOUT CONTRAST TECHNIQUE: Multiplanar, multiecho pulse sequences of the brain and surrounding structures were obtained without intravenous contrast. COMPARISON:  Is procedures CT of the head 05/13/2016. FINDINGS: The  diffusion-weighted images demonstrate an acute infarct involving vein posterior right insular cortex and stack passive involvement of the right postcentral gyrus. There is medium bulb on the posterior right frontal lobe along the posterior cingulate gyrus as well. Extensive T2 changes are associated with these areas of acute infarction. There are more remote cortical infarct involving the right occipital lobe. No acute hemorrhage or mass lesion is present. The ventricles are of normal size. There is local mass effect associated with the areas of acute infarction which effaces the sulci. There is no midline shift. The internal auditory canals are  within normal limits bilaterally. The brainstem and cerebellum are normal. Flow is present in the major intracranial arteries. The globes and orbits are intact. A fluid level is present in the right maxillary sinus. Thick circumferential mucosal thickening is present as well. There is fluid in the nasopharynx likely reflecting secretions associated with intubation. The mastoid air cells are clear. The skullbase is within normal limits. Midline sagittal images are unremarkable. IMPRESSION: 1. Extensive acute/subacute infarct involving the posterior right MCA territory. This includes much of the right primary motor cortex. 2. Additional involvement of the medial posterior right frontal lobe along the posterior cingulate gyrus. 3. Remote infarct of the right occipital lobe. 4. Extensive sinus disease likely related to intubation. Electronically Signed   By: San Morelle M.D.   On: 05/13/2016 18:50   STUDIES:  Angiogram 6/11  CULTURES: None  ANTIBIOTICS: None  SIGNIFICANT EVENTS: Taken to neuroradiology 6/11, s/p stent + catheter directed tPA.  LINES/TUBES: 7.5 mm ETT 6/10>> PIV x2 Foley 6/10>> R femoral arterial sheath 6/10?>>  DISCUSSION: 65 y/o man with hx of tobacco use presenting with acute stroke  ASSESSMENT / PLAN:  PULMONARY A: Need for mechanical ventilation Tobacco abuse P:   SBT to extubate IS per RT Flutter valve Titrate O2 for sat of 88-92% Counseling on tobacco use once extubated.  CARDIOVASCULAR A:  Maintain SBP 120-140 P:  Labetolol PRN  RENAL A:   Mild renal insuffiencey, unclear chronicity P:   S/p contrast from angiogram as well as CT study. At risk for contrast nephropathy. Monitor, maintain good hydration to avoid ATN. BMET in AM. Replace electrolytes as indicated. KVO-IVF once able to eat.  GASTROINTESTINAL A:   No active issues P:   SLP PPI  HEMATOLOGIC A:   S/p tPA P:  Maintain precautions  INFECTIOUS A:   No issues P:    No indication of infections for now. Monitor WBC and fever.  ENDOCRINE A:   No issues P:   F/u HbA1c  NEUROLOGIC A:   Acute stroke, s/p tPA as well as R ICA stent P:   Management per primary team D/C sedation. Ambulate. RASS goal: 0  FAMILY  - Updates: Family updated bedside.  - Inter-disciplinary family meet or Palliative Care meeting due by:  June 18  The patient is critically ill with multiple organ systems failure and requires high complexity decision making for assessment and support, frequent evaluation and titration of therapies, application of advanced monitoring technologies and extensive interpretation of multiple databases.   Critical Care Time devoted to patient care services described in this note is  45  Minutes. This time reflects time of care of this signee Dr Jennet Maduro. This critical care time does not reflect procedure time, or teaching time or supervisory time of PA/NP/Med student/Med Resident etc but could involve care discussion time.  Rush Farmer, M.D. [facility] Pulmonary/Critical Care Medicine. Pager: [phone]. After hours pager:  [phone].   05/14/2016, 10:17 AM

## 2016-05-14 NOTE — Procedures (Signed)
Extubation Procedure Note  Patient Details:   Name: Johnathan [name] DOB: 03-25-1951 MRN: [id number]   Pt extubated to 4L [place] per MD order. Pt able to vocalize, no stridor noted, VS WNL. Pt tolerating well at this time, RT will continue to monitor.    Evaluation  O2 sats: stable throughout Complications: No apparent complications Patient did tolerate procedure well. Bilateral Breath Sounds: Clear   Yes  Jetty Peeks 05/14/2016, 12:33 PM

## 2016-05-15 DIAGNOSIS — G8194 Hemiplegia, unspecified affecting left nondominant side: Secondary | ICD-10-CM

## 2016-05-15 LAB — BASIC METABOLIC PANEL
Anion gap: 6 (ref 5–15)
BUN: 8 mg/dL (ref 6–20)
CO2: 23 mmol/L (ref 22–32)
Calcium: 8 mg/dL — ABNORMAL LOW (ref 8.9–10.3)
Chloride: 111 mmol/L (ref 101–111)
Creatinine, Ser: 0.84 mg/dL (ref 0.61–1.24)
GFR calc Af Amer: >60 mL/min (ref >60)
GFR calc non Af Amer: >60 mL/min (ref >60)
Glucose, Bld: 127 mg/dL — ABNORMAL HIGH (ref 65–99)
Potassium: 3.3 mmol/L — ABNORMAL LOW (ref 3.5–5.1)
Sodium: 140 mmol/L (ref 135–145)

## 2016-05-15 LAB — CBC
HCT: 39.9 % (ref 39.0–52.0)
Hemoglobin: 13.2 g/dL (ref 13.0–17.0)
MCH: 31.5 pg (ref 26.0–34.0)
MCHC: 33.1 g/dL (ref 30.0–36.0)
MCV: 95.2 fL (ref 78.0–100.0)
Platelets: 202 10*3/uL (ref 150–400)
RBC: 4.19 MIL/uL — ABNORMAL LOW (ref 4.22–5.81)
RDW: 12.9 % (ref 11.5–15.5)
WBC: 10.1 10*3/uL (ref 4.0–10.5)

## 2016-05-15 LAB — PHOSPHORUS: Phosphorus: 1.3 mg/dL — ABNORMAL LOW (ref 2.5–4.6)

## 2016-05-15 LAB — MAGNESIUM: Magnesium: 1.9 mg/dL (ref 1.7–2.4)

## 2016-05-15 MED ORDER — FAMOTIDINE 20 MG PO TABS
20.0000 mg | ORAL_TABLET | Freq: Two times a day (BID) | ORAL | Status: DC
Start: 2016-05-15 — End: 2016-05-15
  Administered 2016-05-15: 20 mg via ORAL
  Filled 2016-05-15: qty 1

## 2016-05-15 MED ORDER — ATORVASTATIN CALCIUM 10 MG PO TABS
10.0000 mg | ORAL_TABLET | Freq: Every day | ORAL | Status: DC
  Administered 2016-05-15: 10 mg via ORAL
  Filled 2016-05-15: qty 1

## 2016-05-15 NOTE — Progress Notes (Signed)
OT Cancellation Note  Patient Details Name: SHAEDON ENGLERT MRN: [id number] DOB: 1951/02/08   Cancelled Treatment:    Reason Eval/Treat Not Completed: Patient not medically ready (BEDREST order set) Please increase activity orders.  Vonita Moss   OTR/L Pager: [phone] Office: [phone] .  05/15/2016, 8:08 AM

## 2016-05-15 NOTE — Progress Notes (Signed)
Rehab Admissions Coordinator Note:  Patient was screened by Cleatrice Burke for appropriateness for an Inpatient Acute Rehab Consult per PT recommendation. At this time, we are recommending Inpatient Rehab consult.  Cleatrice Burke 05/15/2016, 1:05 PM  I can be reached at [phone].

## 2016-05-15 NOTE — Consult Note (Signed)
SLP Cancellation Note  Patient Details Name: ELRIC GLAVIN MRN: [id number] DOB: Oct 03, 1951   Cancelled treatment:        Unable to complete SLE at this time, as pt is unavailable (with PT). Will continue efforts.  Shonna Chock 05/15/2016, 10:44 AM  [facility], [place] [phone]

## 2016-05-15 NOTE — Progress Notes (Signed)
Referring Physician(s): Dr Roland Rack  Supervising Physician: Luanne Bras  Patient Status:  Inpatient  Chief Complaint:  CVA R ICA pta/stent 6/11 early am by Dr. Estanislado Pandy   Subjective:  Johnathan [name] is sitting up in the chair eating. Doing much better today. He was enjoying visitors in his room at the time of my visit.  Allergies: Review of patient's allergies indicates no known allergies.  Medications: Prior to Admission medications   Medication Sig Start Date End Date Taking? Authorizing Provider  omeprazole (PRILOSEC OTC) 20 MG tablet Take 20 mg by mouth every morning.   Yes Historical Provider, MD     Vital Signs: BP 172/84 mmHg  Pulse 75  Temp(Src) 99.2 F (37.3 C) (Oral)  Resp 18  Ht 5\' 6"  (1.676 m)  Wt 173 lb 8 oz (78.7 kg)  BMI 28.02 kg/m2  SpO2 98%  Physical Exam Awake and alert Up in chair Heart RRR Lungs Clear Moves all extremities Rt groin OK    Imaging: Ct Angio Head W/cm &/or Wo Cm  05/12/2016  CLINICAL DATA:  Acute onset of left-sided weakness. Stroke syndrome. EXAM: CT ANGIOGRAPHY HEAD AND NECK TECHNIQUE: Multidetector CT imaging of the head and neck was performed using the standard protocol during bolus administration of intravenous contrast. Multiplanar CT image reconstructions and MIPs were obtained to evaluate the vascular anatomy. Carotid stenosis measurements (when applicable) are obtained utilizing NASCET criteria, using the distal internal carotid diameter as the denominator. CONTRAST:  50 cc Isovue 370 COMPARISON:  CT earlier same day FINDINGS: CTA NECK Aortic arch: Mild atherosclerosis of the aortic arch. No aneurysm or dissection. Branching pattern is normal, with the variation of the left vertebral artery arising directly from the arch. Right carotid system: Common carotid artery widely patent to the bifurcation. There is advanced atherosclerosis at the right carotid bifurcation with some soft plaque in the ICA bulb and  occlusion of the ICA in the bulb. No antegrade flow beyond the bulb. No ECA stenosis. Left carotid system: Common carotid artery widely patent to the bifurcation. Mild atherosclerotic disease at the carotid bifurcation. Soft plaque at the ICA bulb region with minimal diameter of 3 mm. This is consistent with a 40% stenosis. Vertebral arteries:Both vertebral arteries widely patent at their origins and through the cervical region. As noted, the left vertebral artery arises from the arch. Skeleton: Ordinary mild spondylosis. Other neck: No soft tissue lesion. Lung apices clear. Mild emphysema. CTA HEAD Anterior circulation: No antegrade flow in the right internal carotid artery at the skullbase level. Reconstituted flow within the carotid siphon. Atherosclerotic calcification in the siphon but no stenosis in that region. Right anterior cerebral artery is widely patent. Patent anterior communicating artery. Left M1 segment is widely patent. At the MCA bifurcation, I think there is a missing branch. There appear to be missing branch vessels in the right parietal region. Left ICA is widely patent at the skull base and through the siphon region. Left anterior and middle cerebral vessels appear widely patent. Posterior circulation: Both vertebral arteries are widely patent to the basilar. No basilar stenosis. Posterior circulation branch vessels intact. Venous sinuses: Patent and normal Anatomic variants: None significant Delayed phase: No abnormal enhancement. Question early loss of gray-white differentiation in the right insular and parietal region. 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 No antegrade flow beyond that in the neck. Reconstitution at the carotid siphon region probably from external to internal collaterals and flow from a patent anterior communicating artery.  I think there is probably a missing branch vessel of the right MCA bifurcation with diminished distal vessel supply in the upper insular  and parietal region on the right. Atherosclerotic plaque in the ICA bulb region on the left with 40% stenosis. Critical Value/emergent results were discussed in person at the time of interpretation on 05/12/2016 at 7:00 pm to Dr. Roland Rack , who verbally acknowledged these results. Electronically Signed   By: Nelson Chimes M.D.   On: 05/12/2016 19:26   Ct Head Wo Contrast  05/13/2016  CLINICAL DATA:  Follow-up code stroke. RIGHT MCA occlusion. Status post revascularization of RIGHT internal carotid artery occlusion. EXAM: CT HEAD WITHOUT CONTRAST TECHNIQUE: Contiguous axial images were obtained from the base of the skull through the vertex without intravenous contrast. COMPARISON:  CT HEAD May 12, 2016 at 1816 hours FINDINGS: INTRACRANIAL CONTENTS: The ventricles and sulci are normal for age. No intraparenchymal hemorrhage, mass effect nor midline shift. Small area loss of RIGHT insular gray-white matter differentiation associated with focally dense RIGHT insular middle cerebral artery. Residual intravascular contrast. No abnormal extra-axial fluid collections. Basal cisterns are patent. Mild calcific atherosclerosis of the carotid siphons. ORBITS: The included ocular globes and orbital contents are non-suspicious. SINUSES: Moderate paranasal sinusitis with RIGHT maxillary mucoperiosteal reaction. Mastoid air cells are well aerated. SKULL/SOFT TISSUES: No skull fracture. No significant soft tissue swelling. IMPRESSION: Small acute RIGHT MCA territory infarct involving the insula with focally dense RIGHT insular MCA concerning for thromboembolism. These results will be called to the ordering clinician or representative by the Radiologist Assistant, and communication documented in the PACS or zVision Dashboard. Electronically Signed   By: Elon Alas M.D.   On: 05/13/2016 01:21   Ct Head Wo Contrast  05/12/2016  CLINICAL DATA:  Left-sided weakness which occurred while working in the yard. EXAM: CT  HEAD WITHOUT CONTRAST TECHNIQUE: Contiguous axial images were obtained from the base of the skull through the vertex without intravenous contrast. COMPARISON:  None. FINDINGS: No brain atrophy. No definite acute finding. Tiny calcifications in the basal ganglia bilaterally. One could question early loss of gray-white differentiation in the deep insula on the right. This is not definite. I would give this either a 9 or 10 aspects score. No mass lesion, hydrocephalus or extra-axial collection. No calvarial abnormality. Mild inflammatory changes of the paranasal sinuses. IMPRESSION: No definite acute finding. Normal versus early loss of gray-white differentiation in the deep insula on the right. This is either aspects 9 or aspects 10. These results were called by telephone at the time of interpretation on 05/12/2016 at 6:31 pm to Dr. Leonel Ramsay, who verbally acknowledged these results. Electronically Signed   By: Nelson Chimes M.D.   On: 05/12/2016 18:40   Ct Angio Neck W/cm &/or Wo/cm  05/12/2016  CLINICAL DATA:  Acute onset of left-sided weakness. Stroke syndrome. EXAM: CT ANGIOGRAPHY HEAD AND NECK TECHNIQUE: Multidetector CT imaging of the head and neck was performed using the standard protocol during bolus administration of intravenous contrast. Multiplanar CT image reconstructions and MIPs were obtained to evaluate the vascular anatomy. Carotid stenosis measurements (when applicable) are obtained utilizing NASCET criteria, using the distal internal carotid diameter as the denominator. CONTRAST:  50 cc Isovue 370 COMPARISON:  CT earlier same day FINDINGS: CTA NECK Aortic arch: Mild atherosclerosis of the aortic arch. No aneurysm or dissection. Branching pattern is normal, with the variation of the left vertebral artery arising directly from the arch. Right carotid system: Common carotid artery widely patent to  the bifurcation. There is advanced atherosclerosis at the right carotid bifurcation with some soft  plaque in the ICA bulb and occlusion of the ICA in the bulb. No antegrade flow beyond the bulb. No ECA stenosis. Left carotid system: Common carotid artery widely patent to the bifurcation. Mild atherosclerotic disease at the carotid bifurcation. Soft plaque at the ICA bulb region with minimal diameter of 3 mm. This is consistent with a 40% stenosis. Vertebral arteries:Both vertebral arteries widely patent at their origins and through the cervical region. As noted, the left vertebral artery arises from the arch. Skeleton: Ordinary mild spondylosis. Other neck: No soft tissue lesion. Lung apices clear. Mild emphysema. CTA HEAD Anterior circulation: No antegrade flow in the right internal carotid artery at the skullbase level. Reconstituted flow within the carotid siphon. Atherosclerotic calcification in the siphon but no stenosis in that region. Right anterior cerebral artery is widely patent. Patent anterior communicating artery. Left M1 segment is widely patent. At the MCA bifurcation, I think there is a missing branch. There appear to be missing branch vessels in the right parietal region. Left ICA is widely patent at the skull base and through the siphon region. Left anterior and middle cerebral vessels appear widely patent. Posterior circulation: Both vertebral arteries are widely patent to the basilar. No basilar stenosis. Posterior circulation branch vessels intact. Venous sinuses: Patent and normal Anatomic variants: None significant Delayed phase: No abnormal enhancement. Question early loss of gray-white differentiation in the right insular and parietal region. 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 No antegrade flow beyond that in the neck. Reconstitution at the carotid siphon region probably from external to internal collaterals and flow from a patent anterior communicating artery. I think there is probably a missing branch vessel of the right MCA bifurcation with diminished distal vessel  supply in the upper insular and parietal region on the right. Atherosclerotic plaque in the ICA bulb region on the left with 40% stenosis. Critical Value/emergent results were discussed in person at the time of interpretation on 05/12/2016 at 7:00 pm to Dr. Roland Rack , who verbally acknowledged these results. Electronically Signed   By: Nelson Chimes M.D.   On: 05/12/2016 19:26   Johnathan Brain Wo Contrast  05/13/2016  CLINICAL DATA:  Right ICA occlusion with acute onset of left-sided weakness. Stent assisted revascularization of the right internal carotid artery was before the with improved flow. The patient is still sedated following the procedure. EXAM: MRI HEAD WITHOUT CONTRAST TECHNIQUE: Multiplanar, multiecho pulse sequences of the brain and surrounding structures were obtained without intravenous contrast. COMPARISON:  Is procedures CT of the head 05/13/2016. FINDINGS: The diffusion-weighted images demonstrate an acute infarct involving vein posterior right insular cortex and stack passive involvement of the right postcentral gyrus. There is medium bulb on the posterior right frontal lobe along the posterior cingulate gyrus as well. Extensive T2 changes are associated with these areas of acute infarction. There are more remote cortical infarct involving the right occipital lobe. No acute hemorrhage or mass lesion is present. The ventricles are of normal size. There is local mass effect associated with the areas of acute infarction which effaces the sulci. There is no midline shift. The internal auditory canals are within normal limits bilaterally. The brainstem and cerebellum are normal. Flow is present in the major intracranial arteries. The globes and orbits are intact. A fluid level is present in the right maxillary sinus. Thick circumferential mucosal thickening is present as well. There is fluid in  the nasopharynx likely reflecting secretions associated with intubation. The mastoid air cells are  clear. The skullbase is within normal limits. Midline sagittal images are unremarkable. IMPRESSION: 1. Extensive acute/subacute infarct involving the posterior right MCA territory. This includes much of the right primary motor cortex. 2. Additional involvement of the medial posterior right frontal lobe along the posterior cingulate gyrus. 3. Remote infarct of the right occipital lobe. 4. Extensive sinus disease likely related to intubation. Electronically Signed   By: San Morelle M.D.   On: 05/13/2016 18:50   Ir Sherre Lain Stent Cerv Carotid W/o Emb-prot Mod Sed  05/15/2016  CLINICAL DATA:  Acute onset of left-sided weakness dysarthria. Neglect. Abnormal CT angiogram of the brain and neck . EXAM: IR PERCUTANEOUS ART THORMBECTOMY/INFUSION INTRACRANIAL INCLUDE DIAG ANGIO BILATERAL COMMON CAROTID ARTERIOGRAM, RIGHT VERTEBRAL ARTERY ANGIOGRAM FOLLOWED BY ENDOVASCULAR COMPLETE REVASCULARIZATION OF SYMPTOMATIC ACUTE OCCLUSION OF THE RIGHT INTERNAL CAROTID ARTERY USING STENT ASSISTED ANGIOPLASTY AND ALSO OF THE RIGHT MIDDLE CEREBRAL ARTERY INFERIOR DIVISION AND 3 REGION BRANCHES USING SUPER SELECTIVE INTRACRANIAL INTRA-ARTERIAL 5 MG OF tPA. PROCEDURE: Contrast: Isovue 300 approximately 60 mL. Anesthesia/Sedation:  General anesthesia. Medications: As per general anesthesia. Following a full explanation of the procedure along with the potential associated complications, an informed witnessed consent was obtained from the patient's wife and son. Risks of intracranial hemorrhage of 10-15%, worsening neurological deficit, ventilator dependency, death and inability to revascularize were all reviewed in detail. Informed consent was obtained. The patient was then put under general anesthesia by the Department of Anesthesiology at [facility]. The right groin was prepped and draped in the usual sterile fashion. Thereafter using modified Seldinger technique, transfemoral access into the right common femoral artery  was obtained without difficulty. Over a 0.035 inch guidewire a 5 French Pinnacle sheath was inserted. Through this, and also over a 0.035 inch guidewire a 5 French JB1 catheter was advanced to the aortic arch region and selectively positioned in the innominate artery, the right common carotid artery and the left common carotid artery. There were no acute complications. The patient tolerated the procedure well. FINDINGS: The innominate artery injection demonstrates wide patency of the right vertebral artery to the cranial skull base. Opacification is seen of the right posterior-inferior cerebellar artery in the right vertebral junction. The opacified portions of the basilar artery, appears to be widely patent. The right common carotid arteriogram demonstrates wide patency of the right external carotid artery and its major branches. More distally there is partial reconstitution of the caval cavernous segment of the right internal carotid artery to the supraclinoid segment and partial flow into the right middle cerebral artery distribution. The collaterals are from the nasolacrimal branches of the right external carotid arteries with retrograde flow of the ophthalmic artery. Opacification is also noted into the right anterior cerebral artery and distally. The delayed arterial phase demonstrates areas of occlusion of the M3 branches of the inferior division of right middle cerebral artery, and also the posterior perisylvian branch. A wide area of hypoperfusion is noted involving the right parietal and the upper parietal occipital regions. The right internal carotid artery demonstrates complete angiographic occlusion with stasis of contrast just distal to the bulb. There appears to be no evidence of angiographic string sign. However, reconstitution of the vessel is seen as described above. The left common carotid arteriogram demonstrates the left external carotid artery and its major branches to be widely patent. The  left internal carotid artery at the bulb has minimal soft plaque along the posterolateral  wall. No acute ulceration is noted. More distally the vessel is seen to opacify to the cranial skull base. The petrous, cavernous and supraclinoid segments appear patent with mild to moderate narrowing of the supraclinoid left ICA just proximal to the origin of the left posterior communicating artery. The left middle cerebral artery and the left anterior cerebral artery opacify normally into the capillary and venous phases. ENDOVASCULAR COMPLETE REVASCULARIZATION OF SYMPTOMATIC ACUTE OCCLUSION OF THE RIGHT INTERNAL CAROTID ARTERY JUST DISTAL TO THE BULB WITH STENT ASSISTED ANGIOPLASTY, WITH PARTIAL RECANALIZATION OF THE OCCLUDED M3 BRANCHES OF THE RIGHT MIDDLE CEREBRAL ARTERY USING SUPER SELECTIVE INTRACRANIAL INTRA-ARTERIAL 5 MG OF tPA. The diagnostic JB1 catheter in the right common carotid artery was exchanged over a 0.035 inch 300 cm Rosen exchange guidewire for an 80 cm 6 [place] [employer] shuttle sheath using biplane roadmap technique and constant fluoroscopic guidance. Good aspiration was obtained from the side port at the hub of the [facility] shuttle sheath. A gentle contrast injection demonstrated no evidence of spasms, dissections or of intraluminal filling defects. This was then connected to continuous heparinized saline infusion. At this time, in a coaxial manner and with constant heparinized saline infusion using biplane roadmap technique and constant fluoroscopic guidance, a Headway 2 tip micro catheter was then advanced over a 0.014 inch Softtip Synchro micro guidewire to the distal end of the [employer] sheath. Using a torque device, the wire was gently manipulated through the occluded right internal carotid artery and advanced to the distal vertical segment followed by the micro catheter. The guidewire was removed. Good aspiration was obtained from the hub of the micro catheter. This in turn was then exchanged for a  0.014 inch Softtip Transend EX exchange micro guidewire. The exchange guidewire had a J-tip configuration to avoid dissections or inducing spasm, a gentle control arteriogram performed through the [facility] shuttle sheath following removal of the micro catheter demonstrated antegrade flow albeit slow to the distal right internal carotid artery. Also demonstrated was now masking of a tight focal stenosis just distal to the right carotid bulb. At this time, in a coaxial manner and with constant heparinized saline infusion using biplane roadmap technique and constant fluoroscopic guidance and a rapid exchange technique, a 4 mm x 30 mm Via tract 0.014 inch balloon was then advanced and positioned across the tight focal stenosis. This was then gently inflated using a micro inflation syringe device via micro tubing in a slow controlled fashion to a diameter of approximately 3.5 mm. The balloon was then deflated and retrieved proximally as the distal wire was maintained in the horizontal petrous segment. A control arteriogram performed through the [place] shuttle sheath demonstrated excellent antegrade flow now into the right internal carotid artery to the cranial skull base and also intracranially. No change was seen in the intracranial circulation. Over the exchange micro guidewire, a 2 tip Headway 021 micro catheter was then advanced and positioned in the distal petrous segment. The micro guidewire was then removed. This then was replaced with a 0.014 inch Softtip Synchro micro guidewire with a J-tip configuration, this was then advanced distal to the micro catheter and advanced into the inferior division of the right middle cerebral artery followed by the micro catheter. The micro catheter was then maintained just proximal to the 2 major branches emanating from the inferior division of the right middle cerebral artery. After having confirmed wide patency and safe position of the tip of the micro catheter,  approximately 5 mg of super  selective intracranial intra-arterial tPA was then infused over about 5 minutes and 5 cc of normal saline. At the end of this, a control arteriogram performed through the [facility] shuttle sheath demonstrated slow flow in the proximal right internal carotid artery. There was now improved opacification of the distal M3 parietal branches of the inferior division of the right middle cerebral artery. The micro catheter was then retrieved proximally into the petrous segment and exchanged for an exchange transient EX micro guidewire with a J-tip configuration. The micro catheter was then retrieved and removed. Measurements were then performed of the right internal carotid artery at the site of the previous occlusion. It was decided to proceed with the placement of a 8/6 mm x 40 mm Xact stent. It was then retrogradely flushed with heparinized saline infusion and advanced again using the rapid exchange system and positioned such that there was coverage distal and proximal portions with the proximal portion in the distal right common carotid artery. Once established, this stent was deployed without difficulty. The delivery micro catheter of the stent was then gently retrieved and removed while maintaining a distal position of the exchange micro guidewire. A control arteriogram performed through the [place] shuttle sheath demonstrated excellent apposition distally and proximally. A control arteriogram performed at 15 minutes demonstrated progressive filling defects throughout the entirety of the stent more so in the proximal two thirds. This prompted the use of approximately a total of 18 mg of intra-arterial Integrilin at approximately 10 minute intervals to see if there was clearance of the acute platelet aggregation response within the stent. This appeared to be transient periods and prompted the use of approximately 9 mg of intra-arterial ReoPro again delivered over approximately 9 minutes.  Arteriograms were performed at 1 hour 10 minutes following the placement of the stent demonstrated improved patency within the stented segment of the right internal carotid artery. Smoothing of the filling defects was noted with significant improved caliber. Given the continued response to the ReoPro, the procedure was then stopped. A final control arteriogram performed through the [place] shuttle sheath in the right common carotid artery continued to demonstrate improved caliber and flow through the stented segment with smooth filling defect noted in the distal portion of the stent and to a lesser degree the proximal portion of stent. Improved flow was also noted intracranially without new filling defects noted. The shuttle sheath was then retrieved into the abdominal aorta and exchanged over a J-tip guidewire for 6 [place] Cook shuttle sheath. In turn it was then connected to continuous heparinized saline infusion. Throughout the procedure the patient's neurological status and hemodynamic status remained stable. No evidence of intracranial mass-effect or extravasation was seen. The patient's hemodynamic status and neurological status remained stable. The patient was then transferred to the CT scanner for postprocedural CT scan of the brain. IMPRESSION: Status post endovascular complete revascularization of acute symptomatic occlusion of the right internal carotid artery using stent assisted angioplasty, with rescue infusion of 18 mg of the Integrilin, and 9 mg of ReoPro intra-arterially into the stent with gradual clearance of platelet aggregation response. Status post super selective intracranial intra-arterial infusion of 5 mg of ReoPro to the inferior division of the right middle cerebral artery with gradual clearance of multiple filling defects involving the parietal M3 branches of the inferior division of the right middle cerebral artery. A TCI 2b revascularization was achieved angiographically.  Electronically Signed   By: Luanne Bras M.D.   On: 05/14/2016 10:09  Portable Chest Xray  05/13/2016  CLINICAL DATA:  65 year old male status post intubation. EXAM: PORTABLE CHEST 1 VIEW COMPARISON:  Chest radiograph dated 09/19/2010 FINDINGS: Endotracheal tube approximately 4 cm above the carina. An enteric tube courses towards the left upper abdomen with tip beyond the inferior margin of the image. The lungs are clear. No pleural effusion or pneumothorax. The cardiac silhouette is within normal limits with no acute osseous pathology. IMPRESSION: Endotracheal tube above the carina. Electronically Signed   By: Anner Crete M.D.   On: 05/13/2016 02:22   Dg Abd Portable 1v  05/13/2016  CLINICAL DATA:  Nasogastric tube placement. EXAM: PORTABLE ABDOMEN - 1 VIEW COMPARISON:  Chest radiograph of earlier today FINDINGS: Nasogastric tube terminates the proximal stomach, with side port above the gastroesophageal junction. Nonobstructive bowel gas pattern. Low pelvis excluded. IMPRESSION: Nasogastric tube terminates at the proximal stomach with side port above the gastroesophageal junction. Recommend advancement for optimal positioning. These results will be called to the ordering clinician or representative by the Radiologist Assistant, and communication documented in the PACS or zVision Dashboard. Electronically Signed   By: Abigail Miyamoto M.D.   On: 05/13/2016 08:52   Ir Percutaneous Art Thrombectomy/infusion Intracranial Inc Diag Angio  05/15/2016  CLINICAL DATA:  Acute onset of left-sided weakness dysarthria. Neglect. Abnormal CT angiogram of the brain and neck . EXAM: IR PERCUTANEOUS ART THORMBECTOMY/INFUSION INTRACRANIAL INCLUDE DIAG ANGIO BILATERAL COMMON CAROTID ARTERIOGRAM, RIGHT VERTEBRAL ARTERY ANGIOGRAM FOLLOWED BY ENDOVASCULAR COMPLETE REVASCULARIZATION OF SYMPTOMATIC ACUTE OCCLUSION OF THE RIGHT INTERNAL CAROTID ARTERY USING STENT ASSISTED ANGIOPLASTY AND ALSO OF THE RIGHT MIDDLE CEREBRAL  ARTERY INFERIOR DIVISION AND 3 REGION BRANCHES USING SUPER SELECTIVE INTRACRANIAL INTRA-ARTERIAL 5 MG OF tPA. PROCEDURE: Contrast: Isovue 300 approximately 60 mL. Anesthesia/Sedation:  General anesthesia. Medications: As per general anesthesia. Following a full explanation of the procedure along with the potential associated complications, an informed witnessed consent was obtained from the patient's wife and son. Risks of intracranial hemorrhage of 10-15%, worsening neurological deficit, ventilator dependency, death and inability to revascularize were all reviewed in detail. Informed consent was obtained. The patient was then put under general anesthesia by the Department of Anesthesiology at [facility] The right groin was prepped and draped in the usual sterile fashion. Thereafter using modified Seldinger technique, transfemoral access into the right common femoral artery was obtained without difficulty. Over a 0.035 inch guidewire a 5 French Pinnacle sheath was inserted. Through this, and also over a 0.035 inch guidewire a 5 French JB1 catheter was advanced to the aortic arch region and selectively positioned in the innominate artery, the right common carotid artery and the left common carotid artery. There were no acute complications. The patient tolerated the procedure well. FINDINGS: The innominate artery injection demonstrates wide patency of the right vertebral artery to the cranial skull base. Opacification is seen of the right posterior-inferior cerebellar artery in the right vertebral junction. The opacified portions of the basilar artery, appears to be widely patent. The right common carotid arteriogram demonstrates wide patency of the right external carotid artery and its major branches. More distally there is partial reconstitution of the caval cavernous segment of the right internal carotid artery to the supraclinoid segment and partial flow into the right middle cerebral artery  distribution. The collaterals are from the nasolacrimal branches of the right external carotid arteries with retrograde flow of the ophthalmic artery. Opacification is also noted into the right anterior cerebral artery and distally. The delayed arterial phase demonstrates areas of occlusion  of the M3 branches of the inferior division of right middle cerebral artery, and also the posterior perisylvian branch. A wide area of hypoperfusion is noted involving the right parietal and the upper parietal occipital regions. The right internal carotid artery demonstrates complete angiographic occlusion with stasis of contrast just distal to the bulb. There appears to be no evidence of angiographic string sign. However, reconstitution of the vessel is seen as described above. The left common carotid arteriogram demonstrates the left external carotid artery and its major branches to be widely patent. The left internal carotid artery at the bulb has minimal soft plaque along the posterolateral wall. No acute ulceration is noted. More distally the vessel is seen to opacify to the cranial skull base. The petrous, cavernous and supraclinoid segments appear patent with mild to moderate narrowing of the supraclinoid left ICA just proximal to the origin of the left posterior communicating artery. The left middle cerebral artery and the left anterior cerebral artery opacify normally into the capillary and venous phases. ENDOVASCULAR COMPLETE REVASCULARIZATION OF SYMPTOMATIC ACUTE OCCLUSION OF THE RIGHT INTERNAL CAROTID ARTERY JUST DISTAL TO THE BULB WITH STENT ASSISTED ANGIOPLASTY, WITH PARTIAL RECANALIZATION OF THE OCCLUDED M3 BRANCHES OF THE RIGHT MIDDLE CEREBRAL ARTERY USING SUPER SELECTIVE INTRACRANIAL INTRA-ARTERIAL 5 MG OF tPA. The diagnostic JB1 catheter in the right common carotid artery was exchanged over a 0.035 inch 300 cm Rosen exchange guidewire for an 80 cm 6 [place] [employer] shuttle sheath using biplane roadmap technique and  constant fluoroscopic guidance. Good aspiration was obtained from the side port at the hub of the [facility] shuttle sheath. A gentle contrast injection demonstrated no evidence of spasms, dissections or of intraluminal filling defects. This was then connected to continuous heparinized saline infusion. At this time, in a coaxial manner and with constant heparinized saline infusion using biplane roadmap technique and constant fluoroscopic guidance, a Headway 2 tip micro catheter was then advanced over a 0.014 inch Softtip Synchro micro guidewire to the distal end of the [employer] sheath. Using a torque device, the wire was gently manipulated through the occluded right internal carotid artery and advanced to the distal vertical segment followed by the micro catheter. The guidewire was removed. Good aspiration was obtained from the hub of the micro catheter. This in turn was then exchanged for a 0.014 inch Softtip Transend EX exchange micro guidewire. The exchange guidewire had a J-tip configuration to avoid dissections or inducing spasm, a gentle control arteriogram performed through the [facility] shuttle sheath following removal of the micro catheter demonstrated antegrade flow albeit slow to the distal right internal carotid artery. Also demonstrated was now masking of a tight focal stenosis just distal to the right carotid bulb. At this time, in a coaxial manner and with constant heparinized saline infusion using biplane roadmap technique and constant fluoroscopic guidance and a rapid exchange technique, a 4 mm x 30 mm Via tract 0.014 inch balloon was then advanced and positioned across the tight focal stenosis. This was then gently inflated using a micro inflation syringe device via micro tubing in a slow controlled fashion to a diameter of approximately 3.5 mm. The balloon was then deflated and retrieved proximally as the distal wire was maintained in the horizontal petrous segment. A control arteriogram performed  through the [place] shuttle sheath demonstrated excellent antegrade flow now into the right internal carotid artery to the cranial skull base and also intracranially. No change was seen in the intracranial circulation. Over the exchange micro guidewire, a  2 tip Headway 021 micro catheter was then advanced and positioned in the distal petrous segment. The micro guidewire was then removed. This then was replaced with a 0.014 inch Softtip Synchro micro guidewire with a J-tip configuration, this was then advanced distal to the micro catheter and advanced into the inferior division of the right middle cerebral artery followed by the micro catheter. The micro catheter was then maintained just proximal to the 2 major branches emanating from the inferior division of the right middle cerebral artery. After having confirmed wide patency and safe position of the tip of the micro catheter, approximately 5 mg of super selective intracranial intra-arterial tPA was then infused over about 5 minutes and 5 cc of normal saline. At the end of this, a control arteriogram performed through the [facility] shuttle sheath demonstrated slow flow in the proximal right internal carotid artery. There was now improved opacification of the distal M3 parietal branches of the inferior division of the right middle cerebral artery. The micro catheter was then retrieved proximally into the petrous segment and exchanged for an exchange transient EX micro guidewire with a J-tip configuration. The micro catheter was then retrieved and removed. Measurements were then performed of the right internal carotid artery at the site of the previous occlusion. It was decided to proceed with the placement of a 8/6 mm x 40 mm Xact stent. It was then retrogradely flushed with heparinized saline infusion and advanced again using the rapid exchange system and positioned such that there was coverage distal and proximal portions with the proximal portion in the distal  right common carotid artery. Once established, this stent was deployed without difficulty. The delivery micro catheter of the stent was then gently retrieved and removed while maintaining a distal position of the exchange micro guidewire. A control arteriogram performed through the [place] shuttle sheath demonstrated excellent apposition distally and proximally. A control arteriogram performed at 15 minutes demonstrated progressive filling defects throughout the entirety of the stent more so in the proximal two thirds. This prompted the use of approximately a total of 18 mg of intra-arterial Integrilin at approximately 10 minute intervals to see if there was clearance of the acute platelet aggregation response within the stent. This appeared to be transient periods and prompted the use of approximately 9 mg of intra-arterial ReoPro again delivered over approximately 9 minutes. Arteriograms were performed at 1 hour 10 minutes following the placement of the stent demonstrated improved patency within the stented segment of the right internal carotid artery. Smoothing of the filling defects was noted with significant improved caliber. Given the continued response to the ReoPro, the procedure was then stopped. A final control arteriogram performed through the [place] shuttle sheath in the right common carotid artery continued to demonstrate improved caliber and flow through the stented segment with smooth filling defect noted in the distal portion of the stent and to a lesser degree the proximal portion of stent. Improved flow was also noted intracranially without new filling defects noted. The shuttle sheath was then retrieved into the abdominal aorta and exchanged over a J-tip guidewire for 6 [place] [employer] shuttle sheath. In turn it was then connected to continuous heparinized saline infusion. Throughout the procedure the patient's neurological status and hemodynamic status remained stable. No evidence of  intracranial mass-effect or extravasation was seen. The patient's hemodynamic status and neurological status remained stable. The patient was then transferred to the CT scanner for postprocedural CT scan of the brain. 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 Status post super selective intracranial intra-arterial infusion of 5 mg of ReoPro to the inferior division of the right middle cerebral artery with gradual clearance of multiple filling defects involving the parietal M3 branches of the inferior division of the right middle cerebral artery. A TCI 2b revascularization was achieved angiographically. Electronically Signed   By: Luanne Bras M.D.   On: 05/14/2016 10:09   Ir Angio Intra Extracran Sel Com Carotid Innominate Uni L Mod Sed  05/15/2016  CLINICAL DATA:  Acute onset of left-sided weakness dysarthria. Neglect. Abnormal CT angiogram of the brain and neck . EXAM: IR PERCUTANEOUS ART THORMBECTOMY/INFUSION INTRACRANIAL INCLUDE DIAG ANGIO BILATERAL COMMON CAROTID ARTERIOGRAM, RIGHT VERTEBRAL ARTERY ANGIOGRAM FOLLOWED BY ENDOVASCULAR COMPLETE REVASCULARIZATION OF SYMPTOMATIC ACUTE OCCLUSION OF THE RIGHT INTERNAL CAROTID ARTERY USING STENT ASSISTED ANGIOPLASTY AND ALSO OF THE RIGHT MIDDLE CEREBRAL ARTERY INFERIOR DIVISION AND 3 REGION BRANCHES USING SUPER SELECTIVE INTRACRANIAL INTRA-ARTERIAL 5 MG OF tPA. PROCEDURE: Contrast: Isovue 300 approximately 60 mL. Anesthesia/Sedation:  General anesthesia. Medications: As per general anesthesia. Following a full explanation of the procedure along with the potential associated complications, an informed witnessed consent was obtained from the patient's wife and son. Risks of intracranial  hemorrhage of 10-15%, worsening neurological deficit, ventilator dependency, death and inability to revascularize were all reviewed in detail. Informed consent was obtained. The patient was then put under general anesthesia by the Department of Anesthesiology at [facility]. The right groin was prepped and draped in the usual sterile fashion. Thereafter using modified Seldinger technique, transfemoral access into the right common femoral artery was obtained without difficulty. Over a 0.035 inch guidewire a 5 French Pinnacle sheath was inserted. Through this, and also over a 0.035 inch guidewire a 5 French JB1 catheter was advanced to the aortic arch region and selectively positioned in the innominate artery, the right common carotid artery and the left common carotid artery. There were no acute complications. The patient tolerated the procedure well. FINDINGS: The innominate artery injection demonstrates wide patency of the right vertebral artery to the cranial skull base. Opacification is seen of the right posterior-inferior cerebellar artery in the right vertebral junction. The opacified portions of the basilar artery, appears to be widely patent. The right common carotid arteriogram demonstrates wide patency of the right external carotid artery and its major branches. More distally there is partial reconstitution of the caval cavernous segment of the right internal carotid artery to the supraclinoid segment and partial flow into the right middle cerebral artery distribution. The collaterals are from the nasolacrimal branches of the right external carotid arteries with retrograde flow of the ophthalmic artery. Opacification is also noted into the right anterior cerebral artery and distally. The delayed arterial phase demonstrates areas of occlusion of the M3 branches of the inferior division of right middle cerebral artery, and also the posterior perisylvian branch. A wide area of hypoperfusion is noted  involving the right parietal and the upper parietal occipital regions. The right internal carotid artery demonstrates complete angiographic occlusion with stasis of contrast just distal to the bulb. There appears to be no evidence of angiographic string sign. However, reconstitution of the vessel is seen as described above. The left common carotid arteriogram demonstrates the left external carotid artery and its major branches  to be widely patent. The left internal carotid artery at the bulb has minimal soft plaque along the posterolateral wall. No acute ulceration is noted. More distally the vessel is seen to opacify to the cranial skull base. The petrous, cavernous and supraclinoid segments appear patent with mild to moderate narrowing of the supraclinoid left ICA just proximal to the origin of the left posterior communicating artery. The left middle cerebral artery and the left anterior cerebral artery opacify normally into the capillary and venous phases. ENDOVASCULAR COMPLETE REVASCULARIZATION OF SYMPTOMATIC ACUTE OCCLUSION OF THE RIGHT INTERNAL CAROTID ARTERY JUST DISTAL TO THE BULB WITH STENT ASSISTED ANGIOPLASTY, WITH PARTIAL RECANALIZATION OF THE OCCLUDED M3 BRANCHES OF THE RIGHT MIDDLE CEREBRAL ARTERY USING SUPER SELECTIVE INTRACRANIAL INTRA-ARTERIAL 5 MG OF tPA. The diagnostic JB1 catheter in the right common carotid artery was exchanged over a 0.035 inch 300 cm Rosen exchange guidewire for an 80 cm 6 [place] [employer] shuttle sheath using biplane roadmap technique and constant fluoroscopic guidance. Good aspiration was obtained from the side port at the hub of the [facility] shuttle sheath. A gentle contrast injection demonstrated no evidence of spasms, dissections or of intraluminal filling defects. This was then connected to continuous heparinized saline infusion. At this time, in a coaxial manner and with constant heparinized saline infusion using biplane roadmap technique and constant fluoroscopic guidance, a  Headway 2 tip micro catheter was then advanced over a 0.014 inch Softtip Synchro micro guidewire to the distal end of the [employer] sheath. Using a torque device, the wire was gently manipulated through the occluded right internal carotid artery and advanced to the distal vertical segment followed by the micro catheter. The guidewire was removed. Good aspiration was obtained from the hub of the micro catheter. This in turn was then exchanged for a 0.014 inch Softtip Transend EX exchange micro guidewire. The exchange guidewire had a J-tip configuration to avoid dissections or inducing spasm, a gentle control arteriogram performed through the [facility] shuttle sheath following removal of the micro catheter demonstrated antegrade flow albeit slow to the distal right internal carotid artery. Also demonstrated was now masking of a tight focal stenosis just distal to the right carotid bulb. At this time, in a coaxial manner and with constant heparinized saline infusion using biplane roadmap technique and constant fluoroscopic guidance and a rapid exchange technique, a 4 mm x 30 mm Via tract 0.014 inch balloon was then advanced and positioned across the tight focal stenosis. This was then gently inflated using a micro inflation syringe device via micro tubing in a slow controlled fashion to a diameter of approximately 3.5 mm. The balloon was then deflated and retrieved proximally as the distal wire was maintained in the horizontal petrous segment. A control arteriogram performed through the [place] shuttle sheath demonstrated excellent antegrade flow now into the right internal carotid artery to the cranial skull base and also intracranially. No change was seen in the intracranial circulation. Over the exchange micro guidewire, a 2 tip Headway 021 micro catheter was then advanced and positioned in the distal petrous segment. The micro guidewire was then removed. This then was replaced with a 0.014 inch Softtip Synchro  micro guidewire with a J-tip configuration, this was then advanced distal to the micro catheter and advanced into the inferior division of the right middle cerebral artery followed by the micro catheter. The micro catheter was then maintained just proximal to the 2 major branches emanating from the inferior division of the right middle cerebral artery. After  having confirmed wide patency and safe position of the tip of the micro catheter, approximately 5 mg of super selective intracranial intra-arterial tPA was then infused over about 5 minutes and 5 cc of normal saline. At the end of this, a control arteriogram performed through the [facility] shuttle sheath demonstrated slow flow in the proximal right internal carotid artery. There was now improved opacification of the distal M3 parietal branches of the inferior division of the right middle cerebral artery. The micro catheter was then retrieved proximally into the petrous segment and exchanged for an exchange transient EX micro guidewire with a J-tip configuration. The micro catheter was then retrieved and removed. Measurements were then performed of the right internal carotid artery at the site of the previous occlusion. It was decided to proceed with the placement of a 8/6 mm x 40 mm Xact stent. It was then retrogradely flushed with heparinized saline infusion and advanced again using the rapid exchange system and positioned such that there was coverage distal and proximal portions with the proximal portion in the distal right common carotid artery. Once established, this stent was deployed without difficulty. The delivery micro catheter of the stent was then gently retrieved and removed while maintaining a distal position of the exchange micro guidewire. A control arteriogram performed through the [place] shuttle sheath demonstrated excellent apposition distally and proximally. A control arteriogram performed at 15 minutes demonstrated progressive filling  defects throughout the entirety of the stent more so in the proximal two thirds. This prompted the use of approximately a total of 18 mg of intra-arterial Integrilin at approximately 10 minute intervals to see if there was clearance of the acute platelet aggregation response within the stent. This appeared to be transient periods and prompted the use of approximately 9 mg of intra-arterial ReoPro again delivered over approximately 9 minutes. Arteriograms were performed at 1 hour 10 minutes following the placement of the stent demonstrated improved patency within the stented segment of the right internal carotid artery. Smoothing of the filling defects was noted with significant improved caliber. Given the continued response to the ReoPro, the procedure was then stopped. A final control arteriogram performed through the [place] shuttle sheath in the right common carotid artery continued to demonstrate improved caliber and flow through the stented segment with smooth filling defect noted in the distal portion of the stent and to a lesser degree the proximal portion of stent. Improved flow was also noted intracranially without new filling defects noted. The shuttle sheath was then retrieved into the abdominal aorta and exchanged over a J-tip guidewire for 6 [place] [employer] shuttle sheath. In turn it was then connected to continuous heparinized saline infusion. Throughout the procedure the patient's neurological status and hemodynamic status remained stable. No evidence of intracranial mass-effect or extravasation was seen. The patient's hemodynamic status and neurological status remained stable. The patient was then transferred to the CT scanner for postprocedural CT scan of the brain. 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 Status post super selective intracranial intra-arterial infusion of 5 mg of ReoPro to the inferior division of the right middle cerebral artery with gradual clearance of multiple filling defects involving the parietal M3 branches of the inferior division of the right middle cerebral artery. A TCI 2b  revascularization was achieved angiographically. Electronically Signed   By: Luanne Bras M.D.   On: 05/14/2016 10:09   Ir Angio Vertebral Sel Subclavian Innominate Uni R Mod Sed  05/15/2016  CLINICAL DATA:  Acute onset of left-sided weakness dysarthria. Neglect. Abnormal CT angiogram of the brain and neck . EXAM: IR PERCUTANEOUS ART THORMBECTOMY/INFUSION INTRACRANIAL INCLUDE DIAG ANGIO BILATERAL COMMON CAROTID ARTERIOGRAM, RIGHT VERTEBRAL ARTERY ANGIOGRAM FOLLOWED BY ENDOVASCULAR COMPLETE REVASCULARIZATION OF SYMPTOMATIC ACUTE OCCLUSION OF THE RIGHT INTERNAL CAROTID ARTERY USING STENT ASSISTED ANGIOPLASTY AND ALSO OF THE RIGHT MIDDLE CEREBRAL ARTERY INFERIOR DIVISION AND 3 REGION BRANCHES USING SUPER SELECTIVE INTRACRANIAL INTRA-ARTERIAL 5 MG OF tPA. PROCEDURE: Contrast: Isovue 300 approximately 60 mL. Anesthesia/Sedation:  General anesthesia. Medications: As per general anesthesia. Following a full explanation of the procedure along with the potential associated complications, an informed witnessed consent was obtained from the patient's wife and son. Risks of intracranial hemorrhage of 10-15%, worsening neurological deficit, ventilator dependency, death and inability to revascularize were all reviewed in detail. Informed consent was obtained. The patient was then put under general anesthesia by the Department of Anesthesiology at [facility]. The right groin was prepped and draped in the usual sterile fashion. Thereafter using modified Seldinger technique, transfemoral access into the right common femoral artery  was obtained without difficulty. Over a 0.035 inch guidewire a 5 French Pinnacle sheath was inserted. Through this, and also over a 0.035 inch guidewire a 5 French JB1 catheter was advanced to the aortic arch region and selectively positioned in the innominate artery, the right common carotid artery and the left common carotid artery. There were no acute complications. The patient tolerated the procedure well. FINDINGS: The innominate artery injection demonstrates wide patency of the right vertebral artery to the cranial skull base. Opacification is seen of the right posterior-inferior cerebellar artery in the right vertebral junction. The opacified portions of the basilar artery, appears to be widely patent. The right common carotid arteriogram demonstrates wide patency of the right external carotid artery and its major branches. More distally there is partial reconstitution of the caval cavernous segment of the right internal carotid artery to the supraclinoid segment and partial flow into the right middle cerebral artery distribution. The collaterals are from the nasolacrimal branches of the right external carotid arteries with retrograde flow of the ophthalmic artery. Opacification is also noted into the right anterior cerebral artery and distally. The delayed arterial phase demonstrates areas of occlusion of the M3 branches of the inferior division of right middle cerebral artery, and also the posterior perisylvian branch. A wide area of hypoperfusion is noted involving the right parietal and the upper parietal occipital regions. The right internal carotid artery demonstrates complete angiographic occlusion with stasis of contrast just distal to the bulb. There appears to be no evidence of angiographic string sign. However, reconstitution of the vessel is seen as described above. The left common carotid arteriogram demonstrates the left external carotid artery and its major branches to be widely patent. The  left internal carotid artery at the bulb has minimal soft plaque along the posterolateral wall. No acute ulceration is noted. More distally the vessel is seen to opacify to the cranial skull base. The petrous, cavernous and supraclinoid segments appear patent with mild to moderate narrowing of the supraclinoid left ICA just proximal to the origin of the left posterior communicating artery. The left middle cerebral artery and the left anterior cerebral artery opacify normally into the capillary and venous phases. ENDOVASCULAR COMPLETE REVASCULARIZATION OF SYMPTOMATIC  ACUTE OCCLUSION OF THE RIGHT INTERNAL CAROTID ARTERY JUST DISTAL TO THE BULB WITH STENT ASSISTED ANGIOPLASTY, WITH PARTIAL RECANALIZATION OF THE OCCLUDED M3 BRANCHES OF THE RIGHT MIDDLE CEREBRAL ARTERY USING SUPER SELECTIVE INTRACRANIAL INTRA-ARTERIAL 5 MG OF tPA. The diagnostic JB1 catheter in the right common carotid artery was exchanged over a 0.035 inch 300 cm Rosen exchange guidewire for an 80 cm 6 [place] [employer] shuttle sheath using biplane roadmap technique and constant fluoroscopic guidance. Good aspiration was obtained from the side port at the hub of the [facility] shuttle sheath. A gentle contrast injection demonstrated no evidence of spasms, dissections or of intraluminal filling defects. This was then connected to continuous heparinized saline infusion. At this time, in a coaxial manner and with constant heparinized saline infusion using biplane roadmap technique and constant fluoroscopic guidance, a Headway 2 tip micro catheter was then advanced over a 0.014 inch Softtip Synchro micro guidewire to the distal end of the [employer] sheath. Using a torque device, the wire was gently manipulated through the occluded right internal carotid artery and advanced to the distal vertical segment followed by the micro catheter. The guidewire was removed. Good aspiration was obtained from the hub of the micro catheter. This in turn was then exchanged for a  0.014 inch Softtip Transend EX exchange micro guidewire. The exchange guidewire had a J-tip configuration to avoid dissections or inducing spasm, a gentle control arteriogram performed through the [facility] shuttle sheath following removal of the micro catheter demonstrated antegrade flow albeit slow to the distal right internal carotid artery. Also demonstrated was now masking of a tight focal stenosis just distal to the right carotid bulb. At this time, in a coaxial manner and with constant heparinized saline infusion using biplane roadmap technique and constant fluoroscopic guidance and a rapid exchange technique, a 4 mm x 30 mm Via tract 0.014 inch balloon was then advanced and positioned across the tight focal stenosis. This was then gently inflated using a micro inflation syringe device via micro tubing in a slow controlled fashion to a diameter of approximately 3.5 mm. The balloon was then deflated and retrieved proximally as the distal wire was maintained in the horizontal petrous segment. A control arteriogram performed through the [place] shuttle sheath demonstrated excellent antegrade flow now into the right internal carotid artery to the cranial skull base and also intracranially. No change was seen in the intracranial circulation. Over the exchange micro guidewire, a 2 tip Headway 021 micro catheter was then advanced and positioned in the distal petrous segment. The micro guidewire was then removed. This then was replaced with a 0.014 inch Softtip Synchro micro guidewire with a J-tip configuration, this was then advanced distal to the micro catheter and advanced into the inferior division of the right middle cerebral artery followed by the micro catheter. The micro catheter was then maintained just proximal to the 2 major branches emanating from the inferior division of the right middle cerebral artery. After having confirmed wide patency and safe position of the tip of the micro catheter,  approximately 5 mg of super selective intracranial intra-arterial tPA was then infused over about 5 minutes and 5 cc of normal saline. At the end of this, a control arteriogram performed through the [facility] shuttle sheath demonstrated slow flow in the proximal right internal carotid artery. There was now improved opacification of the distal M3 parietal branches of the inferior division of the right middle cerebral artery. The micro catheter was then retrieved proximally into the petrous  segment and exchanged for an exchange transient EX micro guidewire with a J-tip configuration. The micro catheter was then retrieved and removed. Measurements were then performed of the right internal carotid artery at the site of the previous occlusion. It was decided to proceed with the placement of a 8/6 mm x 40 mm Xact stent. It was then retrogradely flushed with heparinized saline infusion and advanced again using the rapid exchange system and positioned such that there was coverage distal and proximal portions with the proximal portion in the distal right common carotid artery. Once established, this stent was deployed without difficulty. The delivery micro catheter of the stent was then gently retrieved and removed while maintaining a distal position of the exchange micro guidewire. A control arteriogram performed through the [place] shuttle sheath demonstrated excellent apposition distally and proximally. A control arteriogram performed at 15 minutes demonstrated progressive filling defects throughout the entirety of the stent more so in the proximal two thirds. This prompted the use of approximately a total of 18 mg of intra-arterial Integrilin at approximately 10 minute intervals to see if there was clearance of the acute platelet aggregation response within the stent. This appeared to be transient periods and prompted the use of approximately 9 mg of intra-arterial ReoPro again delivered over approximately 9 minutes.  Arteriograms were performed at 1 hour 10 minutes following the placement of the stent demonstrated improved patency within the stented segment of the right internal carotid artery. Smoothing of the filling defects was noted with significant improved caliber. Given the continued response to the ReoPro, the procedure was then stopped. A final control arteriogram performed through the [place] shuttle sheath in the right common carotid artery continued to demonstrate improved caliber and flow through the stented segment with smooth filling defect noted in the distal portion of the stent and to a lesser degree the proximal portion of stent. Improved flow was also noted intracranially without new filling defects noted. The shuttle sheath was then retrieved into the abdominal aorta and exchanged over a J-tip guidewire for 6 [place] shuttle sheath. In turn it was then connected to continuous heparinized saline infusion. Throughout the procedure the patient's neurological status and hemodynamic status remained stable. No evidence of intracranial mass-effect or extravasation was seen. The patient's hemodynamic status and neurological status remained stable. The patient was then transferred to the CT scanner for postprocedural CT scan of the brain. IMPRESSION: Status post endovascular complete revascularization of acute symptomatic occlusion of the right internal carotid artery using stent assisted angioplasty, with rescue infusion of 18 mg of the Integrilin, and 9 mg of ReoPro intra-arterially into the stent with gradual clearance of platelet aggregation response. Status post super selective intracranial intra-arterial infusion of 5 mg of ReoPro to the inferior division of the right middle cerebral artery with gradual clearance of multiple filling defects involving the parietal M3 branches of the inferior division of the right middle cerebral artery. A TCI 2b revascularization was achieved angiographically.  Electronically Signed   By: Luanne Bras M.D.   On: 05/14/2016 10:09    Labs:  CBC:  Recent Labs  05/12/16 1828 05/12/16 1835 05/13/16 0210 05/15/16 0335  WBC 10.7*  --  9.5 10.1  HGB 16.0 16.3 13.8 13.2  HCT 46.8 48.0 40.9 39.9  PLT 247  --  222 202    COAGS:  Recent Labs  05/12/16 1828  INR 1.22  APTT 27    BMP:  Recent Labs  05/12/16 1828 05/12/16  1835 05/13/16 0210 05/15/16 0335  NA 139 137 139 140  K 3.9 4.0 3.8 3.3*  CL 110 110 108 111  CO2 22  --  24 23  GLUCOSE 147* 146* 113* 127*  BUN 12 14 10 8   CALCIUM 9.2  --  7.9* 8.0*  CREATININE 1.47* 1.40* 1.25* 0.84  GFRNONAA 49*  --  59* >60  GFRAA 56*  --  >60 >60    LIVER FUNCTION TESTS:  Recent Labs  05/12/16 1828  BILITOT 1.1  AST 19  ALT 22  ALKPHOS 67  PROT 6.8  ALBUMIN 4.1    Assessment and Plan:  CVA R ICA pta/stent 6/11 am by Dr. Estanislado Pandy  Doing much better today. Remove groin dressing and may shower.  Electronically Signed: Murrell Redden 05/15/2016, 3:28 PM   I spent a total of 15 Minutes at the the patient's bedside AND on the patient's hospital floor or unit, greater than 50% of which was counseling/coordinating care for f/u after R ICA stent

## 2016-05-15 NOTE — Evaluation (Signed)
Speech Language Pathology Evaluation Patient Details Name: Johnathan [name] MRN: [id number] DOB: 1951-10-12 Today's Date: 05/15/2016 Time: 1240-1300 SLP Time Calculation (min) (ACUTE ONLY): 20 min  Problem List:  Patient Active Problem List   Diagnosis Date Noted  . Cerebrovascular accident (CVA) due to thrombosis of right carotid artery ([place])   . Stroke (cerebrum) ([place]) 05/12/2016   Past Medical History: History reviewed. No pertinent past medical history. Past Surgical History:  Past Surgical History  Procedure Laterality Date  . Leg surgery    . Radiology with anesthesia N/A 05/12/2016    Procedure: RADIOLOGY WITH ANESTHESIA;  Surgeon: Luanne Bras, MD;  Location: [facility];  Service: Radiology;  Laterality: N/A;   HPI:  65 year old male admitted 05/12/16 after syncopal episode at home, followed by left weakness. PMH significant for tobacco abuse.   Assessment / Plan / Recommendation Clinical Impression  The Montreal Cognitive Assessment (MoCA) was administered. Pt scored 26/30 (n=26+/30), indicating function within normal limits for this assessment and pt's level of education (high school grad and 1 year of college). Slight left labial weakness noted, however,  speech is fully intelligible and no difficulty swallowing was observed or reported. ST to follow for oral motor strengthening exercises for labial strength and symmetry.    SLP Assessment  Patient would benefit from further Speech Language Pathology Services for oral motor strengthening   Follow Up Recommendations   1-2x/week   Frequency and Duration  2 weeks     SLP Evaluation Prior Functioning  Cognitive/Linguistic Baseline: Within functional limits Type of Home: House  Lives With: Spouse Available Help at Discharge: Family;Available PRN/intermittently Education: HS grad, 1 year college Vocation: Retired   Associate Professor  Overall Cognitive Status: Within Functional Limits for tasks assessed Arousal/Alertness:  Awake/alert Orientation Level: Oriented X4 Attention: Focused;Sustained;Selective Focused Attention: Appears intact Sustained Attention: Appears intact Selective Attention: Appears intact Memory: Appears intact Awareness: Appears intact Problem Solving: Appears intact Comments: 26/30 on MoCA    Comprehension  Auditory Comprehension Overall Auditory Comprehension: Appears within functional limits for tasks assessed    Expression Expression Primary Mode of Expression: Verbal Verbal Expression Overall Verbal Expression: Appears within functional limits for tasks assessed Written Expression Dominant Hand: Right   Oral / Motor  Oral Motor/Sensory Function Overall Oral Motor/Sensory Function: Mild impairment Facial ROM: Reduced left Facial Symmetry: Abnormal symmetry left Facial Strength: Within Functional Limits Facial Sensation: Within Functional Limits Lingual ROM: Within Functional Limits Lingual Symmetry: Within Functional Limits Lingual Strength: Within Functional Limits Lingual Sensation: Within Functional Limits Mandible: Within Functional Limits Motor Speech Overall Motor Speech: Appears within functional limits for tasks assessed        Shonna Chock 05/15/2016, 1:07 PM  Celia B. Quentin Ore [facility], [place] [phone]

## 2016-05-15 NOTE — Evaluation (Signed)
Physical Therapy Evaluation Patient Details Name: Johnathan [name] MRN: [id number] DOB: Sep 11, 1951 Today's Date: 05/15/2016   History of Present Illness  Johnathan [name] is a 65 y.o. male who was in his normal state of health earlier working outside in the yard when he had a syncopal episode and was brough to ED with L  hemiperesis and neglect.MRI revealed acute infarct in posterior R MCA and R occipital lobe.   Clinical Impression  Pt admitted with above. Pt presenting with L hemiplegia and L sided neglect but reports "i'm feeling and moving it better today." Pt currently requiring maxAx2 for OOB mobility and ambulation. Pt very motivated with supportive family and demo's excellent rehab potential. Pt indep PTA and would greatly benefit from intense therapy regimen of CIR upon d/c to maximize functional return.    Follow Up Recommendations CIR    Equipment Recommendations  None recommended by PT (TBD)    Recommendations for Other Services Rehab consult     Precautions / Restrictions Precautions Precautions: Fall Restrictions Weight Bearing Restrictions: No      Mobility  Bed Mobility               General bed mobility comments: pt up in chair  Transfers Overall transfer level: Needs assistance Equipment used:  (2 person lift with gait belt) Transfers: Sit to/from Stand Sit to Stand: Max assist;+2 physical assistance         General transfer comment: pt required tactile cues at posterior hips and blocking of L knee. pt unable to use L UE functionally. pt max assist with posterior cues to achieve full upright standing with L knee blocked and max v/c's to squeeze buttocks to aschieve hip and trunk extension, freq v/c's to contract L quad as well  Ambulation/Gait Ambulation/Gait assistance: Max assist;+2 physical assistance (3rd person for chair follow) Ambulation Distance (Feet): 5 Feet (from chair to sink) Assistive device:  (2 person assist with gait belt) Gait  Pattern/deviations: Step-to pattern;Decreased step length - left;Decreased stance time - left;Ataxic;Scissoring;Narrow base of support Gait velocity: slow Gait velocity interpretation: Below normal speed for age/gender General Gait Details: modA to aide in advancement and placement of L LE. maxAx2 at trunk to obtain midline and maintain upright posture. pt did initiate L LE advancement but LE would adduct and crossover R  Stairs            Wheelchair Mobility    Modified Rankin (Stroke Patients Only) Modified Rankin (Stroke Patients Only) Pre-Morbid Rankin Score: No symptoms Modified Rankin: Moderately severe disability     Balance Overall balance assessment: Needs assistance         Standing balance support: Single extremity supported Standing balance-Leahy Scale: Zero Standing balance comment: requires maxAx1 to maintain standing at sink while working with OT                             Pertinent Vitals/Pain Pain Assessment: 0-10 Pain Score: 2  Pain Location: R groin at site of cath Pain Descriptors / Indicators: Sore Pain Intervention(s): Monitored during session    Home Living Family/patient expects to be discharged to:: Inpatient rehab Living Arrangements: Spouse/significant other Available Help at Discharge: Family;Available PRN/intermittently Type of Home: House Home Access: Stairs to enter Entrance Stairs-Rails: None Entrance Stairs-Number of Steps: 3 Home Layout: One level Home Equipment: Walker - 2 wheels;Crutches      Prior Function Level of Independence: Independent  Comments: retired from [employer] her at [employer], wife also works at [employer]: Right    Extremity/Trunk Assessment   Upper Extremity Assessment: Defer to OT evaluation (noted L weakness)           Lower Extremity Assessment: LLE deficits/detail   LLE Deficits / Details: grossly 3-/5  Cervical / Trunk  Assessment: Normal  Communication   Communication: No difficulties  Cognition Arousal/Alertness: Awake/alert Behavior During Therapy: WFL for tasks assessed/performed Overall Cognitive Status: Within Functional Limits for tasks assessed                      General Comments General comments (skin integrity, edema, etc.): discussed at length with pt and spouse CIR and what to anticipate    Exercises        Assessment/Plan    PT Assessment Patient needs continued PT services  PT Diagnosis Difficulty walking;Generalized weakness;Hemiplegia non-dominant side   PT Problem List Decreased strength;Decreased range of motion;Decreased activity tolerance;Decreased balance;Decreased mobility;Impaired sensation  PT Treatment Interventions DME instruction;Gait training;Stair training;Functional mobility training;Therapeutic exercise;Balance training;Therapeutic activities;Neuromuscular re-education;Cognitive remediation;Patient/family education   PT Goals (Current goals can be found in the Care Plan section) Acute Rehab PT Goals Patient Stated Goal: to get better PT Goal Formulation: With patient/family Time For Goal Achievement: 05/29/16 Potential to Achieve Goals: Good    Frequency Min 4X/week   Barriers to discharge Decreased caregiver support (wife works) wife works    Co-evaluation PT/OT/SLP Co-Evaluation/Treatment: Yes Reason for Co-Treatment: Complexity of the patient's impairments (multi-system involvement) PT goals addressed during session: Mobility/safety with mobility         End of Session Equipment Utilized During Treatment: Gait belt Activity Tolerance: Patient tolerated treatment well Patient left: in chair;with call bell/phone within reach;with chair alarm set;with family/visitor present Nurse Communication: Mobility status         Time: 1036-1105 PT Time Calculation (min) (ACUTE ONLY): 29 min   Charges:   PT Evaluation $PT Eval High Complexity: 1  Procedure     PT G CodesKingsley Callander 05/15/2016, 12:34 PM  Kittie Plater, PT, DPT Pager #: [phone] Office #: [phone]

## 2016-05-15 NOTE — Progress Notes (Signed)
STROKE TEAM PROGRESS NOTE   SUBJECTIVE (INTERVAL HISTORY) His therapists are  at the bedside. He needs 2 person assist to walk   OBJECTIVE Temp:  [98 F (36.7 C)-99.2 F (37.3 C)] 99.2 F (37.3 C) (06/13 1415) Pulse Rate:  [55-75] 75 (06/13 1415) Cardiac Rhythm:  [-] Normal sinus rhythm (06/13 0224) Resp:  [15-21] 18 (06/13 1415) BP: (137-172)/(70-93) 172/84 mmHg (06/13 1415) SpO2:  [94 %-100 %] 98 % (06/13 1415)  CBC:   Recent Labs Lab 05/12/16 1828  05/13/16 0210 05/15/16 0335  WBC 10.7*  --  9.5 10.1  NEUTROABS 8.5*  --  7.1  --   HGB 16.0  < > 13.8 13.2  HCT 46.8  < > 40.9 39.9  MCV 94.0  --  95.1 95.2  PLT 247  --  222 202  < > = values in this interval not displayed.  Basic Metabolic Panel:   Recent Labs Lab 05/13/16 0210 05/15/16 0335  NA 139 140  K 3.8 3.3*  CL 108 111  CO2 24 23  GLUCOSE 113* 127*  BUN 10 8  CREATININE 1.25* 0.84  CALCIUM 7.9* 8.0*  MG  --  1.9  PHOS  --  1.3*    Lipid Panel:     Component Value Date/Time   CHOL 127 05/13/2016 0210   TRIG 136 05/13/2016 0210   HDL 30* 05/13/2016 0210   CHOLHDL 4.2 05/13/2016 0210   VLDL 27 05/13/2016 0210   LDLCALC 70 05/13/2016 0210   HgbA1c:  Lab Results  Component Value Date   HGBA1C 5.6 05/13/2016   Urine Drug Screen:     Component Value Date/Time   LABOPIA NONE DETECTED 05/13/2016 0940   COCAINSCRNUR NONE DETECTED 05/13/2016 0940   LABBENZ POSITIVE* 05/13/2016 0940   AMPHETMU NONE DETECTED 05/13/2016 0940   THCU NONE DETECTED 05/13/2016 0940   LABBARB NONE DETECTED 05/13/2016 0940      IMAGING  Ct Head Wo Contrast 05/13/2016   Small acute RIGHT MCA territory infarct involving the insula with focally dense RIGHT insular MCA concerning for thromboembolism.  05/12/2016   No definite acute finding. Normal versus early loss of gray-white differentiation in the deep insula on the right. This is either aspects 9 or aspects 10.   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 No antegrade flow beyond that in the neck. Reconstitution at the carotid siphon region probably from external to internal collaterals and flow from a patent anterior communicating artery. I think there is probably a missing branch vessel of the right MCA bifurcation with diminished distal vessel supply in the upper insular and parietal region on the right. Atherosclerotic plaque in the ICA bulb region on the left with 40% stenosis.   Cerebral angiogram S/P bilateral common carotid and RT Vert A angiograms followed by superselective infusion of 5mg  of tpa and endovascul;ar revascularization of symptomatic occluded RT ICA prox with stent assisted angioplasty ,and infusion of 18 mg of IA Integrelin and 9 mg of REopro into intrastent thrombosis with gradual clearance of filling defects   MRI brain 05/13/2016  1. Extensive acute/subacute infarct involving the posterior right MCA territory. This includes much of the right primary motor cortex. 2. Additional involvement of the medial posterior right frontal lobe along the posterior cingulate gyrus. 3. Remote infarct of the right occipital lobe. 4. Extensive sinus disease likely related to intubation.  2D Echocardiogram  - Left ventricle: The  cavity size was normal. Systolic function was  normal. The estimated ejection fraction was in the range of 55% to 60%. Wall motion was normal; there were no regional wall motion abnormalities. Left ventricular diastolic function parameters were normal. - Atrial septum: No defect or patent foramen ovale was identified.  Impressions:  - No cardiac source of emboli was indentified.  Portable Chest Xray 05/13/2016   Endotracheal tube above the carina.    PHYSICAL EXAM Constitutional: Appears well-developed and well-nourished.  Patient intubated and sedated Eyes: No scleral injection HENT: No OP obstrucion Head: Normocephalic.  Cardiovascular: Normal rate and regular  rhythm.  Respiratory: CTA GI: Soft. No distension.   Skin: WDI  Neuro: Mental Status: Patient is awake and interactive some simple commands.on both sides   Cranial Nerves: II: Pupils are equal, round, and reactive to light.  III,IV, VI: EOMI with OCR V: Facial sensation can not be tested VII: Facial movement appears weak on the left when observing eye closure.  VIII: hearing is intact to voice IX, X:  Spontaneous cough XI: XII: not tested   Motor/Sensory Tone is normal. Bulk is normal. On the right side. Follows commands.  On left mild drift LUE and LLE. Grade 3/5 strength LUE and 4/5 LLE. Cerebellar/Gait Not tested   ASSESSMENT/PLAN [name] is a 65 y.o. male with history of diabetes mellitus and tobacco history presenting with syncope and left hemiparesthesias. He  received IV t-PA Saturday, 05/12/2016 at [place] and was taken to IR where he had revascularization with IA tPA, integrilin and reapro with stent to occluded R ICA.   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 Infarct thrombotic from large vessel disease.  Resultant  Left sided weakness  MRI - extensive posterior R MCA territory infarct (~30% of the brain )  CTA head and neck - 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 No antegrade flow beyond that in the neck.  Carotid Doppler - refer to CTA of the neck.  2D Echo - EF 55-560%. No source of embolus  LDL - 70  HgbA1c 5.6  VTE prophylaxis - SCDs Diet Heart Room service appropriate?: Yes; Fluid consistency:: Thin  No antithrombotic prior to admission, now on aspirin 325 mg daily. Once able to swallow, Given large vessel intracranial atherosclerosis, patient should be treated with aspirin 81 mg and clopidogrel 75 mg orally every day x 3 months for secondary stroke prevention. After 3 months, change to plavix alone. Long-term dual antiplatelets are contraindicated due to risk for  intracerebral hemorrhage.   Ongoing aggressive stroke risk factor management  Therapy recommendations:  pending   Disposition:  pending (Pt lives w. Wife, retired from [employer], no real medical care PTA)  Acute respiratory failure  Intubated for neuro intervention  Remains intubated as he needed to remain flat and was uncooperative  Ok for extubation today  CCM following  Carotid occlusion  R ICA occlusion  S/p emergent stent placement in IR  Collar on - d/c if ok with IR today  Hypertension  Blood pressure stablized  Permissive hypertension (OK if < 220/120) but gradually normalize in 5-7 days  Long-term BP goal normotensive  Hyperlipidemia  Home meds:  No lipid lowering medications prior to admission  LDL 70, goal < 70 add lipitor 10 mg  Consider statin once able to swallow  Other Stroke Risk Factors  Advanced age  Cigarette smoker - advised to stop smoking. 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 I have made any additions or clarifications directly to the above note. Agree with note above.  I had a long discussion with the patient's wife regarding his neurological presentation, plan for evaluation, treatment and answered questions. Recommend continue aspirin and Plavix. Add lipitor 10 mg. Rehab consult  Greater than 50% of time during this 25 minute visit was spent on counseling and coordination of care about stroke risk and treatment     Antony Contras, MD Medical Director [place] Pager:  [phone] 05/15/2016 4:37 PM    To contact Stroke Continuity provider, please refer to [email or web address] After hours, contact General Neurology

## 2016-05-15 NOTE — Care Management Note (Signed)
Case Management Note  Patient Details  Name: Johnathan [name] MRN: [id number] Date of Birth: 1951-11-14  Subjective/Objective:     Pt admitted with CVA. He is from home with his spouse.               Action/Plan: PT/OT recs are for CIR. CM following for d/c needs.   Expected Discharge Date:                  Expected Discharge Plan:  [place]  In-House Referral:     Discharge planning Services     Post Acute Care Choice:    Choice offered to:     DME Arranged:    DME Agency:     HH Arranged:    [place] Agency:     Status of Service:  In process, will continue to follow  Medicare Important Message Given:    Date Medicare IM Given:    Medicare IM give by:    Date Additional Medicare IM Given:    Additional Medicare Important Message give by:     If discussed at [place] of Stay Meetings, dates discussed:    Additional Comments:  Pollie Friar, RN 05/15/2016, 4:32 PM

## 2016-05-15 NOTE — Evaluation (Signed)
Occupational Therapy Evaluation Patient Details Name: Johnathan [name] MRN: [id number] DOB: [birth year] Today's Date: 05/15/2016    History of Present Illness Johnathan [name] is a 65 y.o. male who was in his normal state of health earlier working outside in the yard when he had a syncopal episode and was brough to ED with L  hemiperesis and neglect.MRI revealed acute infarct in posterior R MCA and R occipital lobe.    Clinical Impression   PT admitted with CVA. Pt currently with functional limitiations due to the deficits listed below (see OT problem list). PTA was independent with all adls. Pt was working on installing a well with family when CVA occurred.  Pt will benefit from skilled OT to increase their independence and safety with adls and balance to allow discharge CIR. Pt is motivated to gain use of L UE and participate in ongoing therapy. Strong recommend CIR to maximize return to independence. 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      Follow Up Recommendations  CIR;Supervision/Assistance - 24 hour    Equipment Recommendations  3 in 1 bedside comode;Other (comment) (RW)    Recommendations for Other Services Rehab consult     Precautions / Restrictions Precautions Precautions: Fall Restrictions Weight Bearing Restrictions: No      Mobility Bed Mobility               General bed mobility comments: pt up in chair  Transfers Overall transfer level: Needs assistance Equipment used: 2 person hand held assist Transfers: Sit to/from Stand Sit to Stand: Max assist;+2 physical assistance         General transfer comment: Pt needs cues to sequence entire task. Pt needed physical tactile input for knee extension, hip extension, elongation of trunk, neck extension and to sustain these positions. pt unable to sustain position once obtained    Balance Overall balance assessment: Needs assistance Sitting-balance  support: Single extremity supported;Feet supported Sitting balance-Leahy Scale: Poor     Standing balance support: Single extremity supported;During functional activity Standing balance-Leahy Scale: Zero Standing balance comment: requires maxAx1 to maintain standing at sink while working with OT                            ADL Overall ADL's : Needs assistance/impaired     Grooming: Wash/dry face;Oral care;Moderate assistance;Standing Grooming Details (indicate cue type and reason): required total +2 (A) to remain static standing at sink to complete ADL task. Pt requires L UE support at sink when attempting to use R UE Upper Body Bathing: Moderate assistance   Lower Body Bathing: Maximal assistance           Toilet Transfer: +2 for physical assistance;Maximal assistance           Functional mobility during ADLs: +2 for physical assistance;Maximal assistance;Cueing for sequencing;Cueing for safety General ADL Comments: Pt requires weight shifting and input to shift toward R side for static standing. Pt with L sided weakness. pt quiet after adl and transfer attempt. Wife reports "he gets down on himself if he can't do something. yall joking with him is how you have to handle him. that was good. He needs that."     Vision Vision Assessment?: No apparent visual deficits   Perception     Praxis      Pertinent Vitals/Pain Pain Assessment: Faces Pain Score: 2  Faces Pain Scale: Hurts a  little bit Pain Location: R groining Pain Descriptors / Indicators: Sore Pain Intervention(s): Monitored during session;Premedicated before session;Repositioned     Hand Dominance Right   Extremity/Trunk Assessment Upper Extremity Assessment Upper Extremity Assessment: LUE deficits/detail LUE Deficits / Details: brunstrom III grasp , ataxic movement, shoulder flexion ~40 degrees, decr elbow extension, decr supination / pronation LUE Sensation: decreased light touch;decreased  proprioception LUE Coordination: decreased fine motor;decreased gross motor   Lower Extremity Assessment Lower Extremity Assessment: Defer to PT evaluation LLE Deficits / Details: grossly 3-/5 LLE Sensation: decreased light touch (50% deficit)   Cervical / Trunk Assessment Cervical / Trunk Assessment: Normal   Communication Communication Communication: No difficulties   Cognition Arousal/Alertness: Awake/alert Behavior During Therapy: WFL for tasks assessed/performed Overall Cognitive Status: Within Functional Limits for tasks assessed                     General Comments       Exercises       Shoulder Instructions      Home Living Family/patient expects to be discharged to:: Inpatient rehab Living Arrangements: Spouse/significant other Available Help at Discharge: Family;Available PRN/intermittently Type of Home: House Home Access: Stairs to enter [employer] of Steps: 3 Entrance Stairs-Rails: None Home Layout: One level     Bathroom Shower/Tub: Occupational psychologist: Standard     Home Equipment: Environmental consultant - 2 wheels;Crutches   Additional Comments: 2 dogs/ 2 cats at home  Lives With: Spouse    Prior Functioning/Environment Level of Independence: Independent        Comments: retired from [employer] department here at [employer], wife also works at [employer]    OT Diagnosis: Generalized weakness;Cognitive deficits;Disturbance of vision;Acute pain;Ataxia;Hemiplegia non-dominant side   OT Problem List: Decreased strength;Decreased range of motion;Decreased activity tolerance;Impaired balance (sitting and/or standing);Decreased coordination;Decreased cognition;Decreased safety awareness;Decreased knowledge of use of DME or AE;Decreased knowledge of precautions;Cardiopulmonary status limiting activity;Impaired sensation;Impaired UE functional use;Pain   OT Treatment/Interventions: Self-care/ADL training;Therapeutic exercise;Neuromuscular  education;DME and/or AE instruction;Therapeutic activities;Patient/family education;Balance training    OT Goals(Current goals can be found in the care plan section) Acute Rehab OT Goals Patient Stated Goal: to get better OT Goal Formulation: With patient/family Time For Goal Achievement: 05/29/16 Potential to Achieve Goals: Good  OT Frequency: Min 3X/week   Barriers to D/C:            Co-evaluation PT/OT/SLP Co-Evaluation/Treatment: Yes Reason for Co-Treatment: Complexity of the patient's impairments (multi-system involvement);For patient/therapist safety PT goals addressed during session: Mobility/safety with mobility OT goals addressed during session: ADL's and self-care;Strengthening/ROM      End of Session Equipment Utilized During Treatment: Gait belt Nurse Communication: Mobility status;Precautions  Activity Tolerance: Patient tolerated treatment well Patient left: in chair;with call bell/phone within reach;with chair alarm set;with family/visitor present   Time: 1034-1101 OT Time Calculation (min): 27 min Charges:  OT General Charges $OT Visit: 1 Procedure OT Evaluation $OT Eval Moderate Complexity: 1 Procedure G-Codes:    Parke Poisson B 03-Jun-2016, 2:15 PM   Jeri Modena   OTR/L PagerIP:[phone] Office: [phone] .

## 2016-05-15 NOTE — Consult Note (Signed)
Physical Medicine and Rehabilitation Consult Reason for Consult: Acute infarct posterior right MCA and right occipital lobe Referring Physician: Dr. Leonie Man   HPI: Johnathan [name] is a 65 y.o. right handed male with history of tobacco abuse on no prescription medications. 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 They have a son also of the home who also works. One level home with 3 steps to entry. Presented 05/12/2016 with left-sided weakness. Initial CT scan showed no definite acute findings. MRI of the brain showed extensive acute subacute infarct involving the posterior right MCA territory. Additional involvement of the medial posterior right frontal lobe along the posterior cingulate gyrus. Remote infarct in the right occipital lobe. 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 Interventional radiology underwent right ICA stent 05/13/2016. Patient remained on ventilator for a short time. Echocardiogram with ejection fraction of 60% no wall motion abnormalities. No cardiac source of emboli identified. Patient did not receive TPA. Neurology services maintain on aspirin and Plavix for CVA prophylaxis. Tolerating a regular consistency diet. Physical therapy evaluation completed 05/15/2016 with recommendations of physical medicine rehabilitation consult.   Review of Systems  Constitutional: Negative for fever and chills.       Acute left-sided weakness  HENT: Negative for hearing loss.   Eyes: Negative for blurred vision and double vision.  Respiratory: Negative for cough and shortness of breath.   Cardiovascular: Negative for chest pain and leg swelling.  Gastrointestinal: Positive for constipation. Negative for nausea and vomiting.  Genitourinary: Negative for hematuria.  Musculoskeletal: Positive for myalgias.  Skin: Negative for rash.    Neurological: Negative for seizures, loss of consciousness and headaches.  All other systems reviewed and are negative.  History reviewed. No pertinent past medical history. Past Surgical History  Procedure Laterality Date  . Leg surgery    . Radiology with anesthesia N/A 05/12/2016    Procedure: RADIOLOGY WITH ANESTHESIA;  Surgeon: Luanne Bras, MD;  Location: [facility];  Service: Radiology;  Laterality: N/A;   History reviewed. No pertinent family history. Social History:  reports that he has been smoking Cigarettes.  He has been smoking about 1.00 pack per day. He does not have any smokeless tobacco history on file. He reports that he does not drink alcohol or use illicit drugs. Allergies: No Known Allergies Medications Prior to Admission  Medication Sig Dispense Refill  . omeprazole (PRILOSEC OTC) 20 MG tablet Take 20 mg by mouth every morning.      Home: Home Living Family/patient expects to be discharged to:: Inpatient rehab Living Arrangements: Spouse/significant other Available Help at Discharge: Family, Available PRN/intermittently Type of Home: House Home Access: Stairs to enter Technical brewer of Steps: 3 Entrance Stairs-Rails: None Home Layout: One level Bathroom Shower/Tub: Multimedia programmer: Standard Home Equipment: Environmental consultant - 2 wheels, Crutches  Lives With: Spouse  Functional History: Prior Function Level of Independence: Independent Comments: retired from [place] her at [employer], wife also works at [employer] Status:  Mobility: [place] bed mobility comments: pt up in chair Transfers Overall transfer level: Needs assistance Equipment used:  (2 person lift with gait belt) Transfers: Sit to/from Stand Sit to Stand: Max assist, +2 physical assistance General transfer comment: pt required tactile cues at posterior hips and blocking of L knee. pt unable to use L UE functionally. pt max assist with posterior  cues to  achieve full upright standing with L knee blocked and max v/c's to squeeze buttocks to aschieve hip and trunk extension, freq v/c's to contract L quad as well Ambulation/Gait Ambulation/Gait assistance: Max assist, +2 physical assistance (3rd person for chair follow) Ambulation Distance (Feet): 5 Feet (from chair to sink) Assistive device:  (2 person assist with gait belt) Gait Pattern/deviations: Step-to pattern, Decreased step length - left, Decreased stance time - left, Ataxic, Scissoring, Narrow base of support General Gait Details: modA to aide in advancement and placement of L LE. maxAx2 at trunk to obtain midline and maintain upright posture. pt did initiate L LE advancement but LE would adduct and crossover R Gait velocity: slow Gait velocity interpretation: Below normal speed for age/gender    ADL:    Cognition: Cognition Overall Cognitive Status: Within Functional Limits for tasks assessed Arousal/Alertness: Awake/alert Orientation Level: Oriented X4 Attention: Focused, Sustained, Selective Focused Attention: Appears intact Sustained Attention: Appears intact Selective Attention: Appears intact Memory: Appears intact Awareness: Appears intact Problem Solving: Appears intact Comments: 26/30 on MoCA Cognition Arousal/Alertness: Awake/alert Behavior During Therapy: WFL for tasks assessed/performed Overall Cognitive Status: Within Functional Limits for tasks assessed  Blood pressure 147/93, pulse 72, temperature 98 F (36.7 C), temperature source Oral, resp. rate 18, height 5\' 6"  (1.676 m), weight 78.7 kg (173 lb 8 oz), SpO2 97 %. Physical Exam  Constitutional: He appears well-developed and well-nourished. No distress.  HENT:  Head: Normocephalic.  Eyes:  Pupils round and reactive to light  Neck: Normal range of motion. Neck supple. No thyromegaly present.  Cardiovascular: Normal rate and regular rhythm.   Respiratory: Effort normal and breath sounds normal. No  respiratory distress.  GI: Soft. Bowel sounds are normal. He exhibits no distension.  Musculoskeletal: Normal range of motion.  Neurological: He is alert.  Some left-sided in attention but tracks to all fields. Left central 7. LUE 1 prox to 1+ 2/5 wrist/hand. LLE 2 to 2+/5 prox to distal. Decreased Pain and LT LUE and LLE. Fair insight and awareness.   Skin: Skin is warm and dry.    Results for orders placed or performed during the hospital encounter of 05/12/16 (from the past 24 hour(s))  CBC     Status: Abnormal   Collection Time: 05/15/16  3:35 AM  Result Value Ref Range   WBC 10.1 4.0 - 10.5 K/uL   RBC 4.19 (L) 4.22 - 5.81 MIL/uL   Hemoglobin 13.2 13.0 - 17.0 g/dL   HCT 39.9 39.0 - 52.0 %   MCV 95.2 78.0 - 100.0 fL   MCH 31.5 26.0 - 34.0 pg   MCHC 33.1 30.0 - 36.0 g/dL   RDW 12.9 11.5 - 15.5 %   Platelets 202 150 - 400 K/uL  Basic metabolic panel     Status: Abnormal   Collection Time: 05/15/16  3:35 AM  Result Value Ref Range   Sodium 140 135 - 145 mmol/L   Potassium 3.3 (L) 3.5 - 5.1 mmol/L   Chloride 111 101 - 111 mmol/L   CO2 23 22 - 32 mmol/L   Glucose, Bld 127 (H) 65 - 99 mg/dL   BUN 8 6 - 20 mg/dL   Creatinine, Ser 0.84 0.61 - 1.24 mg/dL   Calcium 8.0 (L) 8.9 - 10.3 mg/dL   GFR calc non Af Amer >60 >60 mL/min   GFR calc Af Amer >60 >60 mL/min   Anion gap 6 5 - 15  Magnesium     Status: None  Collection Time: 05/15/16  3:35 AM  Result Value Ref Range   Magnesium 1.9 1.7 - 2.4 mg/dL  Phosphorus     Status: Abnormal   Collection Time: 05/15/16  3:35 AM  Result Value Ref Range   Phosphorus 1.3 (L) 2.5 - 4.6 mg/dL   Mr Brain Wo Contrast  05/13/2016  CLINICAL DATA:  Right ICA occlusion with acute onset of left-sided weakness. Stent assisted revascularization of the right internal carotid artery was before the with improved flow. The patient is still sedated following the procedure. EXAM: MRI HEAD WITHOUT CONTRAST TECHNIQUE: Multiplanar, multiecho pulse sequences of  the brain and surrounding structures were obtained without intravenous contrast. COMPARISON:  Is procedures CT of the head 05/13/2016. FINDINGS: The diffusion-weighted images demonstrate an acute infarct involving vein posterior right insular cortex and stack passive involvement of the right postcentral gyrus. There is medium bulb on the posterior right frontal lobe along the posterior cingulate gyrus as well. Extensive T2 changes are associated with these areas of acute infarction. There are more remote cortical infarct involving the right occipital lobe. No acute hemorrhage or mass lesion is present. The ventricles are of normal size. There is local mass effect associated with the areas of acute infarction which effaces the sulci. There is no midline shift. The internal auditory canals are within normal limits bilaterally. The brainstem and cerebellum are normal. Flow is present in the major intracranial arteries. The globes and orbits are intact. A fluid level is present in the right maxillary sinus. Thick circumferential mucosal thickening is present as well. There is fluid in the nasopharynx likely reflecting secretions associated with intubation. The mastoid air cells are clear. The skullbase is within normal limits. Midline sagittal images are unremarkable. IMPRESSION: 1. Extensive acute/subacute infarct involving the posterior right MCA territory. This includes much of the right primary motor cortex. 2. Additional involvement of the medial posterior right frontal lobe along the posterior cingulate gyrus. 3. Remote infarct of the right occipital lobe. 4. Extensive sinus disease likely related to intubation. Electronically Signed   By: San Morelle M.D.   On: 05/13/2016 18:50    Assessment/Plan: Diagnosis: Right MCA infarct 1. Does the need for close, 24 hr/day medical supervision in concert with the patient's rehab needs make it unreasonable for this patient to be served in a less intensive  setting? Yes 2. Co-Morbidities requiring supervision/potential complications: htn, post-stroke sequelae 3. Due to bladder management, bowel management, safety, skin/wound care, disease management, medication administration, pain management and patient education, does the patient require 24 hr/day rehab nursing? Yes 4. Does the patient require coordinated care of a physician, rehab nurse, PT (1-2 hrs/day, 5 days/week), OT (1-2 hrs/day, 5 days/week) and SLP (1-2 hrs/day, 5 days/week) to address physical and functional deficits in the context of the above medical diagnosis(es)? Yes Addressing deficits in the following areas: balance, endurance, locomotion, strength, transferring, bowel/bladder control, bathing, dressing, feeding, grooming, toileting, cognition and psychosocial support  5. Can the patient actively participate in an intensive therapy program of at least 3 hrs of therapy per day at least 5 days per week? Yes 6. The potential for patient to make measurable gains while on inpatient rehab is excellent 7. Anticipated functional outcomes upon discharge from inpatient rehab are supervision and min assist  with PT, supervision and min assist with OT, modified independent with SLP. 8. Estimated rehab length of stay to reach the above functional goals is: 20-25 days 9. Does the patient have adequate social supports and living environment  to accommodate these discharge functional goals? Yes 10. Anticipated D/C setting: Home 11. Anticipated post D/C treatments: HH therapy and Outpatient therapy 12. Overall Rehab/Functional Prognosis: excellent  RECOMMENDATIONS: This patient's condition is appropriate for continued rehabilitative care in the following setting: CIR Patient has agreed to participate in recommended program. Yes Note that insurance prior authorization may be required for reimbursement for recommended care.  Comment: Rehab Admissions Coordinator to follow up.  Thanks,  Meredith Staggers, MD, Mellody Drown     05/15/2016

## 2016-05-15 NOTE — Anesthesia Postprocedure Evaluation (Signed)
Anesthesia Post Note  Patient: Johnathan [name]  Procedure(s) Performed: Procedure(s) (LRB): RADIOLOGY WITH ANESTHESIA (N/A)  Patient location during evaluation: SICU Anesthesia Type: General Level of consciousness: sedated Pain management: pain level controlled Vital Signs Assessment: post-procedure vital signs reviewed and stable Respiratory status: patient remains intubated per anesthesia plan Cardiovascular status: stable Anesthetic complications: no    Last Vitals:  Filed Vitals:   05/14/16 2231 05/15/16 0125  BP: 138/71 140/78  Pulse: 59 68  Temp: 36.9 C 36.8 C  Resp: 18 17    Last Pain:  Filed Vitals:   05/15/16 0126  PainSc: 0-No pain                 Aowyn Rozeboom DAVID

## 2016-05-16 ENCOUNTER — Encounter: Payer: Self-pay | Admitting: *Deleted

## 2016-05-16 ENCOUNTER — Inpatient Hospital Stay (HOSPITAL_COMMUNITY)
Admission: RE | Admit: 2016-05-16 | Discharge: 2016-06-01 | DRG: 057 | Disposition: A | Discharge status: Home Health | Payer: 59 | Source: Intra-hospital | Attending: Physical Medicine & Rehabilitation | Admitting: Physical Medicine & Rehabilitation

## 2016-05-16 DIAGNOSIS — I10 Essential (primary) hypertension: Secondary | ICD-10-CM | POA: Diagnosis present

## 2016-05-16 DIAGNOSIS — I69354 Hemiplegia and hemiparesis following cerebral infarction affecting left non-dominant side: Principal | ICD-10-CM

## 2016-05-16 DIAGNOSIS — E876 Hypokalemia: Secondary | ICD-10-CM | POA: Insufficient documentation

## 2016-05-16 DIAGNOSIS — E785 Hyperlipidemia, unspecified: Secondary | ICD-10-CM | POA: Diagnosis present

## 2016-05-16 DIAGNOSIS — M6289 Other specified disorders of muscle: Secondary | ICD-10-CM

## 2016-05-16 DIAGNOSIS — F17210 Nicotine dependence, cigarettes, uncomplicated: Secondary | ICD-10-CM | POA: Diagnosis present

## 2016-05-16 DIAGNOSIS — Z006 Encounter for examination for normal comparison and control in clinical research program: Secondary | ICD-10-CM

## 2016-05-16 DIAGNOSIS — Z79899 Other long term (current) drug therapy: Secondary | ICD-10-CM

## 2016-05-16 DIAGNOSIS — N189 Chronic kidney disease, unspecified: Secondary | ICD-10-CM | POA: Diagnosis not present

## 2016-05-16 DIAGNOSIS — I69398 Other sequelae of cerebral infarction: Secondary | ICD-10-CM | POA: Insufficient documentation

## 2016-05-16 DIAGNOSIS — I63231 Cerebral infarction due to unspecified occlusion or stenosis of right carotid arteries: Secondary | ICD-10-CM

## 2016-05-16 DIAGNOSIS — R269 Unspecified abnormalities of gait and mobility: Secondary | ICD-10-CM | POA: Diagnosis not present

## 2016-05-16 DIAGNOSIS — Z720 Tobacco use: Secondary | ICD-10-CM | POA: Diagnosis not present

## 2016-05-16 DIAGNOSIS — I63239 Cerebral infarction due to unspecified occlusion or stenosis of unspecified carotid arteries: Secondary | ICD-10-CM | POA: Diagnosis present

## 2016-05-16 DIAGNOSIS — N182 Chronic kidney disease, stage 2 (mild): Secondary | ICD-10-CM

## 2016-05-16 DIAGNOSIS — E663 Overweight: Secondary | ICD-10-CM | POA: Diagnosis present

## 2016-05-16 DIAGNOSIS — K5900 Constipation, unspecified: Secondary | ICD-10-CM | POA: Diagnosis not present

## 2016-05-16 DIAGNOSIS — Z95828 Presence of other vascular implants and grafts: Secondary | ICD-10-CM

## 2016-05-16 DIAGNOSIS — I63031 Cerebral infarction due to thrombosis of right carotid artery: Secondary | ICD-10-CM | POA: Diagnosis present

## 2016-05-16 DIAGNOSIS — R531 Weakness: Secondary | ICD-10-CM | POA: Diagnosis present

## 2016-05-16 DIAGNOSIS — J9600 Acute respiratory failure, unspecified whether with hypoxia or hypercapnia: Secondary | ICD-10-CM | POA: Diagnosis present

## 2016-05-16 DIAGNOSIS — I63511 Cerebral infarction due to unspecified occlusion or stenosis of right middle cerebral artery: Secondary | ICD-10-CM | POA: Diagnosis present

## 2016-05-16 DIAGNOSIS — I63311 Cerebral infarction due to thrombosis of right middle cerebral artery: Secondary | ICD-10-CM | POA: Diagnosis not present

## 2016-05-16 MED ORDER — ACETAMINOPHEN 650 MG RE SUPP: 650.0000 mg | Freq: Four times a day (QID) | RECTAL | Status: DC | PRN

## 2016-05-16 MED ORDER — CLOPIDOGREL BISULFATE 75 MG PO TABS
75.0000 mg | ORAL_TABLET | Freq: Every day | ORAL | Status: DC
  Administered 2016-05-17 – 2016-06-01 (×16): 75 mg via ORAL
  Filled 2016-05-16 (×16): qty 1

## 2016-05-16 MED ORDER — PANTOPRAZOLE SODIUM 40 MG PO TBEC
40.0000 mg | DELAYED_RELEASE_TABLET | Freq: Every day | ORAL | Status: DC
  Administered 2016-05-17 – 2016-06-01 (×16): 40 mg via ORAL
  Filled 2016-05-16 (×16): qty 1

## 2016-05-16 MED ORDER — ASPIRIN 325 MG PO TABS
325.0000 mg | ORAL_TABLET | Freq: Every day | ORAL | Status: DC
  Administered 2016-05-17 – 2016-06-01 (×16): 325 mg via ORAL
  Filled 2016-05-16 (×16): qty 1

## 2016-05-16 MED ORDER — ONDANSETRON HCL 4 MG PO TABS: 4.0000 mg | ORAL_TABLET | Freq: Four times a day (QID) | ORAL | Status: DC | PRN

## 2016-05-16 MED ORDER — ONDANSETRON HCL 4 MG/2ML IJ SOLN: 4.0000 mg | Freq: Four times a day (QID) | INTRAMUSCULAR | Status: DC | PRN

## 2016-05-16 MED ORDER — SORBITOL 70 % SOLN
30.0000 mL | Freq: Every day | Status: DC | PRN
  Filled 2016-05-16: qty 30

## 2016-05-16 MED ORDER — ATORVASTATIN CALCIUM 10 MG PO TABS
10.0000 mg | ORAL_TABLET | Freq: Every day | ORAL | Status: DC
  Administered 2016-05-16 – 2016-05-31 (×16): 10 mg via ORAL
  Filled 2016-05-16 (×17): qty 1

## 2016-05-16 MED ORDER — NICOTINE 14 MG/24HR TD PT24
14.0000 mg | MEDICATED_PATCH | Freq: Every day | TRANSDERMAL | Status: DC
  Administered 2016-05-17 – 2016-06-01 (×16): 14 mg via TRANSDERMAL
  Filled 2016-05-16 (×16): qty 1

## 2016-05-16 MED ORDER — ACETAMINOPHEN 500 MG PO TABS
1000.0000 mg | ORAL_TABLET | Freq: Four times a day (QID) | ORAL | Status: DC | PRN
  Administered 2016-05-27 – 2016-05-30 (×3): 1000 mg via ORAL
  Filled 2016-05-16 (×3): qty 2

## 2016-05-16 NOTE — PMR Pre-admission (Signed)
PMR Admission Coordinator Pre-Admission Assessment  Patient: Johnathan [name] is an 65 y.o., male MRN: [id number] DOB: 05-05-1951 Height: 5' 6"  (167.6 cm) Weight: 78.7 kg (173 lb 8 oz)              Insurance Information HMO:     PPO: X     PCP:      IPA:      80/20:      OTHER:  PRIMARYIval Bible      Policy#: [id number]      Subscriber: Spouse  CM Name:  Johnathan [name]      Phone#: [phone] [id number]     Fax#: [phone]; stated that updated clinicals are not needed nor is prior authorization  Pre-Cert#: [id number]     Employer: Retired  Benefits:  Phone #: [phone]     Name: Johnathan [name]. Date: 12/04/15-     Deduct: $0      Out of Pocket Max: $6,550      Life Max: None CIR: $500 deductible then 80%/20%      SNF: 80%/20% Outpatient:  PT/OT/SLP    Co-Pay: $20 Home Health: 80%      Co-Pay: 20% DME: 80%     Co-Pay: 20% Providers: in network   Medicaid Application Date:       Case Manager:  Disability Application Date:       Case Worker:   Emergency [name]    Name Relation Home Work Mobile   [name], Johnathan  [phone]       Current Medical History  Patient Admitting Diagnosis: Right MCA infarct  History of Present Illness: Johnathan [name] is a 65 y.o. right handed male with history of tobacco abuse on no prescription medications. 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 They have a son also of the home who also works. One level home with 3 steps to entry. Presented 05/12/2016 with left-sided weakness. Initial CT scan showed no definite acute findings. MRI of the brain showed extensive acute subacute infarct involving the posterior right MCA territory. Additional involvement of the medial posterior right frontal lobe along the posterior cingulate gyrus. Remote infarct in the right occipital lobe. 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 Interventional radiology underwent right ICA stent 05/13/2016. Patient remained on ventilator for a short time. Echocardiogram with ejection fraction of 60% no wall motion abnormalities. No cardiac source of emboli identified. Patient did not receive TPA. Neurology services maintain on aspirin and Plavix for CVA prophylaxis. Tolerating a regular consistency diet. Physical therapy evaluation completed 05/15/2016 with recommendations of physical medicine rehabilitation consult.Patient was admitted for a comprehensive rehabilitation program.  NIH Total: 9    Past Medical History  History reviewed. No pertinent past medical history.  Family History  family history is not on file.  Prior Rehab/Hospitalizations:  Has the patient had major surgery during 100 days prior to admission? No  Current Medications   Current facility-administered medications:  .  0.9 %  sodium chloride infusion, , Intravenous, Continuous, Johnathan Doom, MD, Stopped at 05/14/16 2100 .  0.9 %  sodium chloride infusion, , Intravenous, Continuous, Johnathan Bras, MD, Last Rate: 75 mL/hr at 05/14/16 2100 .  acetaminophen (TYLENOL) tablet 1,000 mg, 1,000 mg, Oral, Q6H PRN, 1,000 mg at 05/15/16 0225 **OR** acetaminophen (TYLENOL) suppository 650 mg, 650 mg, Rectal, Q6H PRN, Johnathan Bras, MD .  antiseptic oral rinse (CPC / CETYLPYRIDINIUM CHLORIDE 0.05%) solution 7 mL, 7 mL, Mouth Rinse, q12n4p, Garvin Fila, MD, 7 mL at 05/16/16 1257 .  aspirin tablet 325 mg, 325 mg, Oral, Q breakfast, Johnathan Bras, MD, 325 mg at 05/16/16 0845 .  atorvastatin (LIPITOR) tablet 10 mg, 10 mg, Oral, q1800, Garvin Fila, MD, 10 mg at 05/15/16 1728 .  chlorhexidine gluconate (SAGE KIT) (PERIDEX) 0.12 % solution 15 mL, 15 mL, Mouth Rinse, BID, Johnathan Doom, MD, 15 mL at 05/16/16 0800 .  clopidogrel (PLAVIX) tablet 75 mg, 75 mg, Oral, Q breakfast, Johnathan Bras, MD, 75 mg at 05/16/16 0845 .   fentaNYL (SUBLIMAZE) injection 100 mcg, 100 mcg, Intravenous, Q15 min PRN, Colbert Coyer, MD, 100 mcg at 05/13/16 1023 .  fentaNYL (SUBLIMAZE) injection 100 mcg, 100 mcg, Intravenous, Q2H PRN, Colbert Coyer, MD, 100 mcg at 05/14/16 0159 .  labetalol (NORMODYNE,TRANDATE) injection 10 mg, 10 mg, Intravenous, Q10 min PRN, Johnathan Doom, MD, 10 mg at 05/12/16 2011 .  lidocaine (XYLOCAINE) 1 % (with pres) injection, , , PRN, Johnathan Bras, MD, 10 mL at 05/12/16 2134 .  midazolam (VERSED) injection 2 mg, 2 mg, Intravenous, Q2H PRN, Luz Brazen, MD, 2 mg at 05/13/16 1701 .  nicardipine (CARDENE) 65m in 0.86% saline 2063mIV infusion (0.1 mg/ml), 5-15 mg/hr, Intravenous, Continuous, SaLuanne BrasMD, Stopped at 05/13/16 0530 .  nicotine (NICODERM CQ - dosed in mg/24 hours) patch 14 mg, 14 mg, Transdermal, Daily, Johnathan StarchNP, 14 mg at 05/16/16 0845 .  ondansetron (ZOFRAN) injection 4 mg, 4 mg, Intravenous, Q6H PRN, SaLuanne BrasMD .  pantoprazole (PROTONIX) EC tablet 40 mg, 40 mg, Oral, Daily, McGreta DoomMD, 40 mg at 05/16/16 0845 .  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  Patient was completely Independent prior to admission and highly motivated to regain independence.    Home Assistive Devices / Equipment Home Assistive Devices/Equipment: None Home Equipment: Walker - 2  wheels, Crutches  Prior Device Use: Indicate devices/aids used by the patient prior to current illness, exacerbation or injury? None of the above  Prior Functional Level Prior Function Level of Independence: Independent Comments: retired from [employer] here at [employer]wife also works at coProsserDid the patient need help bathing, dressing, using the toilet or eating?  Independent  Indoor Mobility: Did the patient need assistance with walking from room to room (with or without device)? Independent  Stairs: Did the patient need assistance with internal or external stairs (with or without device)? Independent  Functional Cognition: Did the patient need help planning regular tasks such as shopping or remembering to take medications? Independent  Current Functional Level Cognition  Arousal/Alertness: Awake/alert Overall Cognitive Status: Within Functional Limits for tasks assessed Orientation Level: Oriented X4 Attention: Focused, Sustained, Selective Focused Attention: Appears intact Sustained Attention: Appears intact Selective Attention: Appears intact Memory: Appears intact Awareness: Appears intact Problem Solving: Appears intact Comments: 26/30 on  MoCA    Extremity Assessment (includes Sensation/Coordination)  Upper Extremity Assessment: LUE deficits/detail LUE Deficits / Details: brunstrom III grasp , ataxic movement, shoulder flexion ~40 degrees, decr elbow extension, decr supination / pronation LUE Sensation: decreased light touch, decreased proprioception LUE Coordination: decreased fine motor, decreased gross motor  Lower Extremity Assessment: Defer to PT evaluation LLE Deficits / Details: grossly 3-/5 LLE Sensation: decreased light touch (50% deficit)    ADLs  Overall ADL's : Needs assistance/impaired Grooming: Wash/dry face, Oral care, Moderate assistance, Standing Grooming Details (indicate cue type and reason): required total +2 (A) to remain  static standing at sink to complete ADL task. Pt requires L UE support at sink when attempting to use R UE Upper Body Bathing: Moderate assistance Lower Body Bathing: Maximal assistance Lower Body Dressing: Maximal assistance, Sit to/from stand Lower Body Dressing Details (indicate cue type and reason): pt able to cross bil LE. pt required (A) to doff bil socks but demonstrates improvement and able to reach feet. Pt with ataxic movement of L UE.  Toilet Transfer: +2 for physical assistance, Maximal assistance (standard height with bil arm rest) Functional mobility during ADLs: Maximal assistance, +2 for physical assistance General ADL Comments: Pt in chair on arrival and very pleased to see therapy arrive. Pt provided mirror for visual feedback for position in space. Pt able to place L UE on knee this session with decr ataxic movement. pt working on static sitting with elongated spine and head in neutral position. pt able to hold neck flexion but demonstrates posture lean. Pt completed sit<>Stand 4 times.pt with L LE knee flexion and hip flexion today when attempting to transfer. OT /PT taking patient to outside patio for static standing. Pt demonstrates ability to do chest push ups with L LE blocked. Pt when asked to anterior pelvic tilt into chest push up  , pt demostrates knee flexion and hip flexion. PT requires weight shift and max tactile input to remain static standing    Mobility  General bed mobility comments: in chair on arrival    Transfers  Overall transfer level: Needs assistance Equipment used: 2 person hand held assist Transfers: Sit to/from Stand Sit to Stand: Max assist, +2 physical assistance General transfer comment: cues for hand placement and visual cueing to attend to L side of body. pt with decr sensation on L side. Question use of shoes to help with proprioception for somatosensory input .     Ambulation / Gait / Stairs / Wheelchair Mobility   Ambulation/Gait Ambulation/Gait assistance: Max assist, +2 physical assistance (3rd person for chair follow) Ambulation Distance (Feet): 5 Feet (from chair to sink) Assistive device:  (2 person assist with gait belt) Gait Pattern/deviations: Step-to pattern, Decreased step length - left, Decreased stance time - left, Ataxic, Scissoring, Narrow base of support General Gait Details: modA to aide in advancement and placement of L LE. maxAx2 at trunk to obtain midline and maintain upright posture. pt did initiate L LE advancement but LE would adduct and crossover R Gait velocity: slow Gait velocity interpretation: Below normal speed for age/gender    Posture / Balance Balance Overall balance assessment: Needs assistance Sitting-balance support: Bilateral upper extremity supported, Feet supported Sitting balance-Leahy Scale: Poor Standing balance support: Bilateral upper extremity supported, During functional activity Standing balance-Leahy Scale: Zero Standing balance comment: requires maxAx1 to maintain standing at sink while working with OT    Special needs/care consideration BiPAP/CPAP: No CPM: No Continuous Drip IV: No Dialysis: No  Life Vest: No Oxygen: No Special Bed: No Trach Size: No Wound Vac (area): No       Skin: Bruises                               Location: Bilateral upper extremities  Bowel mgmt: 05/15/16 Bladder mgmt: Continent  Diabetic mgmt: HgbA1c 5.6     Previous Home Environment Living Arrangements: Spouse/significant other  Lives With: Spouse Available Help at Discharge: Family, Available PRN/intermittently Type of Home: House Home Layout: One level Home Access: Stairs to enter Entrance Stairs-Rails: None Technical brewer of Steps: 3 Bathroom Shower/Tub: Multimedia programmer: Standard Home Care Services: No Additional Comments: 2 dogs/ 2 cats at home  Discharge Living Setting Plans for Discharge Living Setting: Patient's  home Type of Home at Discharge: House Discharge Home Layout: One level Discharge Home Access: Stairs to enter Entrance Stairs-Rails: None Entrance Stairs-Number of Steps: 2 Discharge Bathroom Shower/Tub: Horticulturist, commercial: Standard Discharge Bathroom Accessibility: Yes How Accessible: Accessible via walker Does the patient have any problems obtaining your medications?: No  Social/Family/Support Systems Patient Roles: Spouse, Parent Contact Information: Spouse: Sandy home:[phone]; cell:[phone] Anticipated Caregiver: spouse, son and son's finace will work together to cover it with plan for patient  Anticipated Ambulance person Information: See above Ability/Limitations of Caregiver: Min physical assist  Caregiver Availability: 24/7 (short term ) Discharge Plan Discussed with Primary Caregiver: Yes Is Caregiver In Agreement with Plan?: Yes Does Caregiver/Family have Issues with Lodging/Transportation while Pt is in Rehab?: No  Goals/Additional Needs Patient/Family Goal for Rehab: PT/OT Supervision-Min assist; SLP Mod I  Expected length of stay: 20-25 days  Cultural Considerations: None Dietary Needs: None Equipment Needs: TBD Special Service Needs: None Additional Information: N/A Pt/Family Agrees to Admission and willing to participate: Yes Program Orientation Provided & Reviewed with Pt/Caregiver Including Roles  & Responsibilities: Yes Additional Information Needs: N/A Information Needs to be Provided By: N/A   Decrease burden of Care through IP rehab admission: No  Possible need for SNF placement upon discharge: Not anticipated   Patient Condition: This patient's condition remains as documented in the consult dated 05/15/16, in which the Rehabilitation Physician determined and documented that the patient's condition is appropriate for intensive rehabilitative care in an inpatient rehabilitation facility. Will admit to inpatient rehab  today.  Preadmission Screen Completed By:  Gunnar Fusi, 05/16/2016 1:36 PM ______________________________________________________________________   Discussed status with Dr. Posey Pronto on 05/16/16 at 1400 and received telephone approval for admission today.  Admission Coordinator:  Gunnar Fusi, time 1400/Date 05/16/16

## 2016-05-16 NOTE — Progress Notes (Signed)
Approached patient about the [facility] research study. Protocol requirements explained to patient/ Questions encouraged and answered. ICF left at bedside for his review. Patient would like to talk with wife and then would like for me to come back and speak with the both of them. Patient thinks he would like to participate.

## 2016-05-16 NOTE — H&P (Signed)
Physical Medicine and Rehabilitation Admission H&P    Chief Complaint  Patient presents with  . Stroke  : HPI: Johnathan [name] is a 65 y.o. right handed male with history of tobacco abuse on no prescription medications. 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 They have a son also of the home who also works. One level home with 3 steps to entry. Presented 05/12/2016 with left-sided weakness. Initial CT scan showed no definite acute findings. MRI of the brain showed extensive acute subacute infarct involving the posterior right MCA territory. Additional involvement of the medial posterior right frontal lobe along the posterior cingulate gyrus. Remote infarct in the right occipital lobe. 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 Interventional radiology underwent right ICA stent 05/13/2016. Patient remained on ventilator for a short time. Echocardiogram with ejection fraction of 60% no wall motion abnormalities. No cardiac source of emboli identified. Patient did not receive TPA. Neurology services maintain on aspirin and Plavix for CVA prophylaxis. Tolerating a regular consistency diet. Physical therapy evaluation completed 05/15/2016 with recommendations of physical medicine rehabilitation consult.Patient was admitted for a comprehensive rehabilitation program.  ROS Constitutional: Negative for fever and chills.   Acute left-sided weakness  HENT: Negative for hearing loss.  Eyes: Negative for blurred vision and double vision.  Respiratory: Negative for cough and shortness of breath.  Cardiovascular: Negative for chest pain and leg swelling.  Gastrointestinal: Positive for constipation. Negative for nausea and vomiting.  Genitourinary: Negative for hematuria.  Musculoskeletal: Positive for myalgias.  Skin: Negative for rash.  Neurological:  Negative for seizures, loss of consciousness and headaches.  All other systems reviewed and are negative  History reviewed. No past medical history. Past Surgical History  Procedure Laterality Date  . Leg surgery    . Radiology with anesthesia N/A 05/12/2016    Procedure: RADIOLOGY WITH ANESTHESIA;  Surgeon: Luanne Bras, MD;  Location: [place];  Service: Radiology;  Laterality: N/A;   History reviewed. No pertinent family history.  Social History:  reports that he has been smoking Cigarettes.  He has been smoking about 1.00 pack per day. He does not have any smokeless tobacco history on file. He reports that he does not drink alcohol or use illicit drugs.  Allergies: No Known Allergies Medications Prior to Admission  Medication Sig Dispense Refill  . omeprazole (PRILOSEC OTC) 20 MG tablet Take 20 mg by mouth every morning.      Home: Home Living Family/patient expects to be discharged to:: Inpatient rehab Living Arrangements: Spouse/significant other Available Help at Discharge: Family, Available PRN/intermittently Type of Home: House Home Access: Stairs to enter Technical brewer of Steps: 3 Entrance Stairs-Rails: None Home Layout: One level Bathroom Shower/Tub: Multimedia programmer: Standard Home Equipment: Environmental consultant - 2 wheels, Crutches Additional Comments: 2 dogs/ 2 cats at home  Lives With: Spouse   Functional History: Prior Function Level of Independence: Independent Comments: retired from [employer] here at [employer], wife also works at [employer] Status:  Mobility: Sasser bed mobility comments: pt up in chair Transfers Overall transfer level: Needs assistance Equipment used: 2 person hand held assist Transfers: Sit to/from Stand Sit to Stand: Max assist, +2 physical assistance General transfer comment: Pt needs cues to sequence entire task. Pt needed physical tactile input for knee extension, hip extension, elongation  of trunk, neck extension and  to sustain these positions. pt unable to sustain position once obtained Ambulation/Gait Ambulation/Gait assistance: Max assist, +2 physical assistance (3rd person for chair follow) Ambulation Distance (Feet): 5 Feet (from chair to sink) Assistive device:  (2 person assist with gait belt) Gait Pattern/deviations: Step-to pattern, Decreased step length - left, Decreased stance time - left, Ataxic, Scissoring, Narrow base of support General Gait Details: modA to aide in advancement and placement of L LE. maxAx2 at trunk to obtain midline and maintain upright posture. pt did initiate L LE advancement but LE would adduct and crossover R Gait velocity: slow Gait velocity interpretation: Below normal speed for age/gender    ADL: ADL Overall ADL's : Needs assistance/impaired Grooming: Wash/dry face, Oral care, Moderate assistance, Standing Grooming Details (indicate cue type and reason): required total +2 (A) to remain static standing at sink to complete ADL task. Pt requires L UE support at sink when attempting to use R UE Upper Body Bathing: Moderate assistance Lower Body Bathing: Maximal assistance Toilet Transfer: +2 for physical assistance, Maximal assistance Functional mobility during ADLs: +2 for physical assistance, Maximal assistance, Cueing for sequencing, Cueing for safety General ADL Comments: Pt requires weight shifting and input to shift toward R side for static standing. Pt with L sided weakness. pt quiet after adl and transfer attempt. Wife reports "he gets down on himself if he can't do something. yall joking with him is how you have to handle him. that was good. He needs that."  Cognition: Cognition Overall Cognitive Status: Within Functional Limits for tasks assessed Arousal/Alertness: Awake/alert Orientation Level: Oriented X4 Attention: Focused, Sustained, Selective Focused Attention: Appears intact Sustained Attention: Appears intact Selective  Attention: Appears intact Memory: Appears intact Awareness: Appears intact Problem Solving: Appears intact Comments: 26/30 on MoCA Cognition Arousal/Alertness: Awake/alert Behavior During Therapy: WFL for tasks assessed/performed Overall Cognitive Status: Within Functional Limits for tasks assessed  Physical Exam: Blood pressure 138/57, pulse 85, temperature 99.5 F (37.5 C), temperature source Oral, resp. rate 18, height 5' 6"  (1.676 m), weight 78.7 kg (173 lb 8 oz), SpO2 98 %. Physical Exam Constitutional: He appears well-developed and well-nourished. No distress.  HENT: Normocephalic. Atraumatic. Eyes: Pupils round and reactive to light. Conj WNL. Neck: Normal range of motion. Neck supple. No thyromegaly present.  Cardiovascular: Normal rate and regular rhythm.  Respiratory: Effort normal and breath sounds normal. No respiratory distress.  GI: Soft. Bowel sounds are normal. He exhibits no distension.  Musculoskeletal: Normal range of motion. No edema. Neurological: He is alert and oriented..  Motor: RUE/RLE: 5/5 proximal to distal. LUE: 4/5 shoulder abduction, elbow flexion/extension, 4+/5 hand grip. LLE: 4/5 hip flexion, knee extension, ankle dorsi/plantar flexion 4+/5 DTRs symmetric. Sensation diminished to light touch LLE.   Skin: Skin is warm and dry   Results for orders placed or performed during the hospital encounter of 05/12/16 (from the past 48 hour(s))  CBC     Status: Abnormal   Collection Time: 05/15/16  3:35 AM  Result Value Ref Range   WBC 10.1 4.0 - 10.5 K/uL   RBC 4.19 (L) 4.22 - 5.81 MIL/uL   Hemoglobin 13.2 13.0 - 17.0 g/dL   HCT 39.9 39.0 - 52.0 %   MCV 95.2 78.0 - 100.0 fL   MCH 31.5 26.0 - 34.0 pg   MCHC 33.1 30.0 - 36.0 g/dL   RDW 12.9 11.5 - 15.5 %   Platelets 202 150 - 400 K/uL  Basic metabolic panel     Status: Abnormal   Collection Time:  05/15/16  3:35 AM  Result Value Ref Range   Sodium 140 135 - 145 mmol/L   Potassium 3.3 (L) 3.5 - 5.1  mmol/L   Chloride 111 101 - 111 mmol/L   CO2 23 22 - 32 mmol/L   Glucose, Bld 127 (H) 65 - 99 mg/dL   BUN 8 6 - 20 mg/dL   Creatinine, Ser 0.84 0.61 - 1.24 mg/dL   Calcium 8.0 (L) 8.9 - 10.3 mg/dL   GFR calc non Af Amer >60 >60 mL/min   GFR calc Af Amer >60 >60 mL/min    Comment: (NOTE) The eGFR has been calculated using the CKD EPI equation. This calculation has not been validated in all clinical situations. eGFR's persistently <60 mL/min signify possible Chronic Kidney Disease.    Anion gap 6 5 - 15  Magnesium     Status: None   Collection Time: 05/15/16  3:35 AM  Result Value Ref Range   Magnesium 1.9 1.7 - 2.4 mg/dL  Phosphorus     Status: Abnormal   Collection Time: 05/15/16  3:35 AM  Result Value Ref Range   Phosphorus 1.3 (L) 2.5 - 4.6 mg/dL   No results found.   Medical Problem List and Plan: 1.  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  DVT Prophylaxis/Anticoagulation: SCDs. Monitor for any signs of DVT 3. Pain Management: Tylenol as needed 4. Hypertension. Permissive hypertension and monitor with increased mobility 5. Neuropsych: This patient is capable of making decisions on his own behalf. 6. Skin/Wound Care: Routine skin checks 7. Fluids/Electrolytes/Nutrition: Routine I&O's with follow-up chemistries 8. Hyperlipidemia. Lipitor 9. Tobacco abuse. Nicoderm patch. Provided counseling. 10. Hypokalemia: Cont to monitor and replete as necessary. 11. Hypophosphatemia: Cont to monitor and replete as necessary. 12. ?CKD: Avoid nephrotoxic meds  Post Admission Physician Evaluation: 1. Functional deficits secondary  to right MCA infarct. 2. Patient is admitted to receive collaborative, interdisciplinary care between the physiatrist, rehab nursing staff, and therapy team. 3. Patient's level of medical complexity and substantial therapy needs in context of that medical necessity cannot be provided at a lesser  intensity of care such as a SNF. 4. Patient has experienced substantial functional loss from his/her baseline which was documented above under the "Functional History" and "Functional Status" headings.  Judging by the patient's diagnosis, physical exam, and functional history, the patient has potential for functional progress which will result in measurable gains while on inpatient rehab.  These gains will be of substantial and practical use upon discharge  in facilitating mobility and self-care at the household level. 5. Physiatrist will provide 24 hour management of medical needs as well as oversight of the therapy plan/treatment and provide guidance as appropriate regarding the interaction of the two. 6. 24 hour rehab nursing will assist with bladder management, bowel management, safety, skin/wound care, disease management, medication administration, pain management and patient education and help integrate therapy concepts, techniques,education, etc. 7. PT will assess and treat for/with: Lower extremity strength, range of motion, stamina, balance, functional mobility, safety, adaptive techniques and equipment, coping skills, pain control, stroke education.   Goals are: Min A/Supervision. 8. OT will assess and treat for/with: ADL's, functional mobility, safety, upper extremity strength, adaptive techniques and equipment, wound mgt, ego support, and community reintegration.   Goals are: Min A/Supervision. Therapy may proceed with showering this patient. 9. SLP will assess and treat for/with: swallowing, cognition.  Goals are: Mod I. 10. Case Management and Social Worker will assess  and treat for psychological issues and discharge planning. 11. Team conference will be held weekly to assess progress toward goals and to determine barriers to discharge. 12. Patient will receive at least 3 hours of therapy per day at least 5 days per week. 13. ELOS: 18-22 days. 14. Prognosis:  good  Delice Lesch,  MD 05/16/2016

## 2016-05-16 NOTE — Care Management Note (Signed)
Case Management Note  Patient Details  Name: Johnathan [name] MRN: [id number] Date of Birth: 1951-09-05  Subjective/Objective:                    Action/Plan: Pt discharging to CIR today. No further needs per CM.   Expected Discharge Date:                  Expected Discharge Plan:  [place]  In-House Referral:     Discharge planning Services  CM Consult  Post Acute Care Choice:    Choice offered to:     DME Arranged:    DME Agency:     HH Arranged:    [place] Agency:     Status of Service:  Completed, signed off  Medicare Important Message Given:    Date Medicare IM Given:    Medicare IM give by:    Date Additional Medicare IM Given:    Additional Medicare Important Message give by:     If discussed at [place] of Stay Meetings, dates discussed:    Additional Comments:  Pollie Friar, RN 05/16/2016, 1:27 PM

## 2016-05-16 NOTE — Progress Notes (Signed)
Patient ID: [id number], male   DOB: 12/02/1951, 65 y.o.   MRN: [id number] Patient admitted to [phone] via bed, escorted by nursing staff.  Patient verbalized understanding of rehab process, signed fall safety agreement.  Appears to be in no immediate distress at this time.  Brita Romp, RN

## 2016-05-16 NOTE — Progress Notes (Signed)
Inpatient Rehabilitation  Patient's wife in agreement with [facility] for post acute rehab and MD clearance received.  Will plan to admit patient to [facility] today.  Please call with questions.  Carmelia Roller., CCC/SLP Admission Coordinator  [place]  Cell [phone]

## 2016-05-16 NOTE — Progress Notes (Signed)
Ankit Lorie Phenix, MD Physician Signed Physical Medicine and Rehabilitation H&P 05/16/2016 4:28 PM    Expand All Collapse All      Physical Medicine and Rehabilitation Admission H&P   Chief Complaint  Patient presents with  . Stroke  : HPI: Johnathan [name] is a 65 y.o. right handed male with history of tobacco abuse on no prescription medications. 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 They have a son also of the home who also works. One level home with 3 steps to entry. Presented 05/12/2016 with left-sided weakness. Initial CT scan showed no definite acute findings. MRI of the brain showed extensive acute subacute infarct involving the posterior right MCA territory. Additional involvement of the medial posterior right frontal lobe along the posterior cingulate gyrus. Remote infarct in the right occipital lobe. 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 Interventional radiology underwent right ICA stent 05/13/2016. Patient remained on ventilator for a short time. Echocardiogram with ejection fraction of 60% no wall motion abnormalities. No cardiac source of emboli identified. Patient did not receive TPA. Neurology services maintain on aspirin and Plavix for CVA prophylaxis. Tolerating a regular consistency diet. Physical therapy evaluation completed 05/15/2016 with recommendations of physical medicine rehabilitation consult.Patient was admitted for a comprehensive rehabilitation program.  ROS Constitutional: Negative for fever and chills.   Acute left-sided weakness  HENT: Negative for hearing loss.  Eyes: Negative for blurred vision and double vision.  Respiratory: Negative for cough and shortness of breath.  Cardiovascular: Negative for chest pain and leg swelling.  Gastrointestinal: Positive for constipation. Negative for nausea  and vomiting.  Genitourinary: Negative for hematuria.  Musculoskeletal: Positive for myalgias.  Skin: Negative for rash.  Neurological: Negative for seizures, loss of consciousness and headaches.  All other systems reviewed and are negative  History reviewed. No past medical history. Past Surgical History  Procedure Laterality Date  . Leg surgery    . Radiology with anesthesia N/A 05/12/2016    Procedure: RADIOLOGY WITH ANESTHESIA; Surgeon: Luanne Bras, MD; Location: [facility]; Service: Radiology; Laterality: N/A;   History reviewed. No pertinent family history.  Social History: reports that he has been smoking Cigarettes. He has been smoking about 1.00 pack per day. He does not have any smokeless tobacco history on file. He reports that he does not drink alcohol or use illicit drugs.  Allergies: No Known Allergies Medications Prior to Admission  Medication Sig Dispense Refill  . omeprazole (PRILOSEC OTC) 20 MG tablet Take 20 mg by mouth every morning.      Home: Home Living Family/patient expects to be discharged to:: Inpatient rehab Living Arrangements: Spouse/significant other Available Help at Discharge: Family, Available PRN/intermittently Type of Home: House Home Access: Stairs to enter Technical brewer of Steps: 3 Entrance Stairs-Rails: None Home Layout: One level Bathroom Shower/Tub: Multimedia programmer: Standard Home Equipment: Environmental consultant - 2 wheels, Crutches Additional Comments: 2 dogs/ 2 cats at home Lives With: Spouse  Functional History: Prior Function Level of Independence: Independent Comments: retired from [employer] here at [employer], wife also works at [employer] Status:  Mobility: [place] bed mobility comments: pt up in chair Transfers Overall transfer level: Needs assistance Equipment used: 2 person hand held assist Transfers: Sit to/from Stand Sit to Stand: Max  assist, +2 physical assistance General transfer comment: Pt needs cues to sequence entire task. Pt  needed physical tactile input for knee extension, hip extension, elongation of trunk, neck extension and to sustain these positions. pt unable to sustain position once obtained Ambulation/Gait Ambulation/Gait assistance: Max assist, +2 physical assistance (3rd person for chair follow) Ambulation Distance (Feet): 5 Feet (from chair to sink) Assistive device: (2 person assist with gait belt) Gait Pattern/deviations: Step-to pattern, Decreased step length - left, Decreased stance time - left, Ataxic, Scissoring, Narrow base of support General Gait Details: modA to aide in advancement and placement of L LE. maxAx2 at trunk to obtain midline and maintain upright posture. pt did initiate L LE advancement but LE would adduct and crossover R Gait velocity: slow Gait velocity interpretation: Below normal speed for age/gender   ADL: ADL Overall ADL's : Needs assistance/impaired Grooming: Wash/dry face, Oral care, Moderate assistance, Standing Grooming Details (indicate cue type and reason): required total +2 (A) to remain static standing at sink to complete ADL task. Pt requires L UE support at sink when attempting to use R UE Upper Body Bathing: Moderate assistance Lower Body Bathing: Maximal assistance Toilet Transfer: +2 for physical assistance, Maximal assistance Functional mobility during ADLs: +2 for physical assistance, Maximal assistance, Cueing for sequencing, Cueing for safety General ADL Comments: Pt requires weight shifting and input to shift toward R side for static standing. Pt with L sided weakness. pt quiet after adl and transfer attempt. Wife reports "he gets down on himself if he can't do something. yall joking with him is how you have to handle him. that was good. He needs that."  Cognition: Cognition Overall Cognitive Status: Within Functional Limits for tasks  assessed Arousal/Alertness: Awake/alert Orientation Level: Oriented X4 Attention: Focused, Sustained, Selective Focused Attention: Appears intact Sustained Attention: Appears intact Selective Attention: Appears intact Memory: Appears intact Awareness: Appears intact Problem Solving: Appears intact Comments: 26/30 on MoCA Cognition Arousal/Alertness: Awake/alert Behavior During Therapy: WFL for tasks assessed/performed Overall Cognitive Status: Within Functional Limits for tasks assessed  Physical Exam: Blood pressure 138/57, pulse 85, temperature 99.5 F (37.5 C), temperature source Oral, resp. rate 18, height _0  (1.676 m), weight 78.7 kg (173 lb 8 oz), SpO2 98 %. Physical Exam Constitutional: He appears well-developed and well-nourished. No distress.  HENT: Normocephalic. Atraumatic. Eyes: Pupils round and reactive to light. Conj WNL. Neck: Normal range of motion. Neck supple. No thyromegaly present.  Cardiovascular: Normal rate and regular rhythm.  Respiratory: Effort normal and breath sounds normal. No respiratory distress.  GI: Soft. Bowel sounds are normal. He exhibits no distension.  Musculoskeletal: Normal range of motion. No edema. Neurological: He is alert and oriented..  Motor: RUE/RLE: 5/5 proximal to distal. LUE: 4/5 shoulder abduction, elbow flexion/extension, 4+/5 hand grip. LLE: 4/5 hip flexion, knee extension, ankle dorsi/plantar flexion 4+/5 DTRs symmetric. Sensation diminished to light touch LLE.  Skin: Skin is warm and dry   Results for orders placed or performed during the hospital encounter of 05/12/16 (from the past 48 hour(s))  CBC Status: Abnormal   Collection Time: 05/15/16 3:35 AM  Result Value Ref Range   WBC 10.1 4.0 - 10.5 K/uL   RBC 4.19 (L) 4.22 - 5.81 MIL/uL   Hemoglobin 13.2 13.0 - 17.0 g/dL   HCT 39.9 39.0 - 52.0 %   MCV 95.2 78.0 - 100.0 fL   MCH 31.5 26.0 - 34.0 pg   MCHC 33.1 30.0 -  36.0 g/dL   RDW 12.9 11.5 - 15.5 %   Platelets 202 150 - 400 K/uL  Basic metabolic panel Status: Abnormal  Collection Time: 05/15/16 3:35 AM  Result Value Ref Range   Sodium 140 135 - 145 mmol/L   Potassium 3.3 (L) 3.5 - 5.1 mmol/L   Chloride 111 101 - 111 mmol/L   CO2 23 22 - 32 mmol/L   Glucose, Bld 127 (H) 65 - 99 mg/dL   BUN 8 6 - 20 mg/dL   Creatinine, Ser 0.84 0.61 - 1.24 mg/dL   Calcium 8.0 (L) 8.9 - 10.3 mg/dL   GFR calc non Af Amer >60 >60 mL/min   GFR calc Af Amer >60 >60 mL/min    Comment: (NOTE) The eGFR has been calculated using the CKD EPI equation. This calculation has not been validated in all clinical situations. eGFR's persistently <60 mL/min signify possible Chronic Kidney Disease.    Anion gap 6 5 - 15  Magnesium Status: None   Collection Time: 05/15/16 3:35 AM  Result Value Ref Range   Magnesium 1.9 1.7 - 2.4 mg/dL  Phosphorus Status: Abnormal   Collection Time: 05/15/16 3:35 AM  Result Value Ref Range   Phosphorus 1.3 (L) 2.5 - 4.6 mg/dL   No results found.   Medical Problem List and Plan: 1. 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 DVT Prophylaxis/Anticoagulation: SCDs. Monitor for any signs of DVT 3. Pain Management: Tylenol as needed 4. Hypertension. Permissive hypertension and monitor with increased mobility 5. Neuropsych: This patient is capable of making decisions on his own behalf. 6. Skin/Wound Care: Routine skin checks 7. Fluids/Electrolytes/Nutrition: Routine I&O's with follow-up chemistries 8. Hyperlipidemia. Lipitor 9. Tobacco abuse. Nicoderm patch. Provided counseling. 10. Hypokalemia: Cont to monitor and replete as necessary. 11. Hypophosphatemia: Cont to monitor and replete as necessary. 12. ?CKD: Avoid nephrotoxic meds  Post Admission Physician Evaluation: 1. Functional  deficits secondary to right MCA infarct. 2. Patient is admitted to receive collaborative, interdisciplinary care between the physiatrist, rehab nursing staff, and therapy team. 3. Patient's level of medical complexity and substantial therapy needs in context of that medical necessity cannot be provided at a lesser intensity of care such as a SNF. 4. Patient has experienced substantial functional loss from his/her baseline which was documented above under the "Functional History" and "Functional Status" headings. Judging by the patient's diagnosis, physical exam, and functional history, the patient has potential for functional progress which will result in measurable gains while on inpatient rehab. These gains will be of substantial and practical use upon discharge in facilitating mobility and self-care at the household level. 5. Physiatrist will provide 24 hour management of medical needs as well as oversight of the therapy plan/treatment and provide guidance as appropriate regarding the interaction of the two. 6. 24 hour rehab nursing will assist with bladder management, bowel management, safety, skin/wound care, disease management, medication administration, pain management and patient education and help integrate therapy concepts, techniques,education, etc. 7. PT will assess and treat for/with: Lower extremity strength, range of motion, stamina, balance, functional mobility, safety, adaptive techniques and equipment, coping skills, pain control, stroke education. Goals are: Min A/Supervision. 8. OT will assess and treat for/with: ADL's, functional mobility, safety, upper extremity strength, adaptive techniques and equipment, wound mgt, ego support, and community reintegration. Goals are: Min A/Supervision. Therapy may proceed with showering this patient. 9. SLP will assess and treat for/with: swallowing, cognition. Goals are: Mod I. 10. Case Management and Social Worker will assess and treat for  psychological issues and discharge planning. 11. Team conference will be held weekly to assess progress toward goals  and to determine barriers to discharge. 12. Patient will receive at least 3 hours of therapy per day at least 5 days per week. 13. ELOS: 18-22 days. 14. Prognosis: good  Delice Lesch, MD 05/16/2016

## 2016-05-16 NOTE — Progress Notes (Signed)
Inpatient Rehabilitation  Addendum: Received a call from Justice is assigned to case [phone] phone:[phone]) who confirmed approval of request to admit patient and clarified that approval is for 7 days with updates due 05/23/16.  Will pass along to IP Rehab CSW/Case Manager.    Carmelia Roller., CCC/SLP Admission Coordinator  [place]  Cell [phone]

## 2016-05-16 NOTE — Progress Notes (Signed)
Inpatient Rehabilitation  Met with patient to discuss team's recommendation for IP Rehab post acute care stay.  Shared booklets and answered questions.  Patient agreeable, but requested that I review benefit coverage with his spouse.  I await a call back from wife, [place].  I have a bed to offer today and will update team of plan after I speak with spouse.  Please call with questions.    Carmelia Roller., CCC/SLP Admission Coordinator  [place]  Cell [phone]

## 2016-05-16 NOTE — Progress Notes (Signed)
Occupational Therapy Treatment Patient Details Name: Johnathan [name] MRN: [id number] DOB: 1951/07/26 Today's Date: 05/16/2016    History of present illness DENY ANZUALDA is a 65 y.o. male who was in his normal state of health earlier working outside in the yard when he had a syncopal episode and was brough to ED with L  hemiperesis and neglect.MRI revealed acute infarct in posterior R MCA and R occipital lobe.    OT comments  Pt demonstrates motivation to participate with therapy. Pt demonstrates L LE knee flexion / hip flexion during session that was not present on evaluation. Pt needs neuromuscular facilitation throughout session. Pt remains excellent CIR candidate. Pt with decr ataxic movement of L hand this session.    Follow Up Recommendations  CIR;Supervision/Assistance - 24 hour    Equipment Recommendations  3 in 1 bedside comode;Other (comment)    Recommendations for Other Services Rehab consult    Precautions / Restrictions Precautions Precautions: Fall Precaution Comments: watch L LE knee flexion during static standing       Mobility Bed Mobility               General bed mobility comments: in chair on arrival  Transfers Overall transfer level: Needs assistance   Transfers: Sit to/from Stand Sit to Stand: Max assist;+2 physical assistance         General transfer comment: cues for hand placement and visual cueing to attend to L side of body. pt with decr sensation on L side. Question use of shoes to help with proprioception for somatosensory input .     Balance Overall balance assessment: Needs assistance Sitting-balance support: Bilateral upper extremity supported;Feet supported Sitting balance-Leahy Scale: Poor     Standing balance support: Bilateral upper extremity supported;During functional activity Standing balance-Leahy Scale: Zero                     ADL Overall ADL's : Needs assistance/impaired                     Lower  Body Dressing: Maximal assistance;Sit to/from stand Lower Body Dressing Details (indicate cue type and reason): pt able to cross bil LE. pt required (A) to doff bil socks but demonstrates improvement and able to reach feet. Pt with ataxic movement of L UE.  Toilet Transfer: +2 for physical assistance;Maximal assistance (standard height with bil arm rest)           Functional mobility during ADLs: Maximal assistance;+2 for physical assistance General ADL Comments: Pt in chair on arrival and very pleased to see therapy arrive. Pt provided mirror for visual feedback for position in space. Pt able to place L UE on knee this session with decr ataxic movement. pt working on static sitting with elongated spine and head in neutral position. pt able to hold neck flexion but demonstrates posture lean. Pt completed sit<>Stand 4 times.pt with L LE knee flexion and hip flexion today when attempting to transfer. OT /PT taking patient to outside patio for static standing. Pt demonstrates ability to do chest push ups with L LE blocked. Pt when asked to anterior pelvic tilt into chest push up  , pt demostrates knee flexion and hip flexion. PT requires weight shift and max tactile input to remain static standing      Vision                     Perception     Praxis  Cognition   Behavior During Therapy: WFL for tasks assessed/performed Overall Cognitive Status: Within Functional Limits for tasks assessed                       Extremity/Trunk Assessment               Exercises     Shoulder Instructions       General Comments      Pertinent Vitals/ Pain       Pain Assessment: No/denies pain  Home Living                                          Prior Functioning/Environment              Frequency Min 3X/week     Progress Toward Goals  OT Goals(current goals can now be found in the care plan section)  Progress towards OT goals: Progressing  toward goals  Acute Rehab OT Goals Patient Stated Goal: to get better OT Goal Formulation: With patient/family Time For Goal Achievement: 05/29/16 Potential to Achieve Goals: Good ADL Goals Pt Will Perform Grooming: with set-up;sitting Pt Will Perform Upper Body Bathing: with min assist;sitting Pt Will Transfer to Toilet: with mod assist;bedside commode;stand pivot transfer Additional ADL Goal #1: Pt will static stand at sink for adl with min (A)   Plan Discharge plan remains appropriate    Co-evaluation    PT/OT/SLP Co-Evaluation/Treatment: Yes Reason for Co-Treatment: Complexity of the patient's impairments (multi-system involvement);For patient/therapist safety   OT goals addressed during session: ADL's and self-care;Strengthening/ROM      End of Session Equipment Utilized During Treatment: Gait belt   Activity Tolerance Patient tolerated treatment well   Patient Left in chair;with call bell/phone within reach;with chair alarm set   Nurse Communication Mobility status;Precautions        Time: [id number] OT Time Calculation (min): 29 min  Charges: OT General Charges $OT Visit: 1 Procedure OT Treatments $Therapeutic Activity: 8-22 mins  Parke Poisson B 05/16/2016, 10:27 AM   Jeri Modena   OTR/L PagerIP:[phone] Office: [phone] .

## 2016-05-16 NOTE — Discharge Summary (Signed)
Stroke Discharge Summary  Patient ID: Johnathan [name]   MRN: [id number]      DOB: 12-19-1950  Date of Admission: 05/12/2016 Date of Discharge: 05/16/2016  Attending Physician:  Garvin Fila, MD, Stroke MD Consulting Physician(s):  Luz Brazen, MD (pulmonary/intensive care), Alger Simons, MD (Physical Medicine & Rehabtilitation)  Patient's PCP:  No primary care provider on file.  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 No pertinent past medical history. Past Surgical History  Procedure Laterality Date  . Leg surgery    . Radiology with anesthesia N/A 05/12/2016    Procedure: RADIOLOGY WITH ANESTHESIA;  Surgeon: Luanne Bras, MD;  Location: [facility];  Service: Radiology;  Laterality: N/A;    Medications to be continued on Rehab . antiseptic oral rinse  7 mL Mouth Rinse q12n4p  . aspirin  325 mg Oral Q breakfast  . atorvastatin  10 mg Oral q1800  . chlorhexidine gluconate (SAGE KIT)  15 mL Mouth Rinse BID  . clopidogrel  75 mg Oral Q breakfast  . nicotine  14 mg Transdermal Daily  . pantoprazole  40 mg Oral Daily    LABORATORY STUDIES CBC    Component Value Date/Time   WBC 10.1 05/15/2016 0335   RBC 4.19* 05/15/2016 0335   HGB 13.2 05/15/2016 0335   HCT 39.9 05/15/2016 0335   PLT 202 05/15/2016 0335   MCV 95.2 05/15/2016 0335   MCH 31.5 05/15/2016 0335   MCHC 33.1 05/15/2016 0335   RDW 12.9 05/15/2016 0335   LYMPHSABS 1.7 05/13/2016 0210   MONOABS 0.6 05/13/2016 0210   EOSABS 0.1 05/13/2016 0210   BASOSABS 0.0 05/13/2016 0210   CMP     Component Value Date/Time   NA 140 05/15/2016 0335   K 3.3* 05/15/2016 0335   CL 111 05/15/2016 0335   CO2 23 05/15/2016 0335   GLUCOSE 127* 05/15/2016 0335   BUN 8 05/15/2016 0335   CREATININE 0.84 05/15/2016 0335   CALCIUM 8.0* 05/15/2016 0335   PROT 6.8 05/12/2016 1828   ALBUMIN 4.1 05/12/2016 1828   AST 19 05/12/2016 1828   ALT 22 05/12/2016 1828   ALKPHOS 67 05/12/2016 1828   BILITOT 1.1 05/12/2016 1828   GFRNONAA >60 05/15/2016 0335   GFRAA >60 05/15/2016 0335   COAGS Lab Results  Component Value Date   INR 1.22 05/12/2016   INR 1.04 09/19/2010   Lipid Panel    Component Value Date/Time   CHOL 127 05/13/2016 0210   TRIG 136 05/13/2016 0210   HDL 30* 05/13/2016 0210   CHOLHDL 4.2 05/13/2016 0210   VLDL 27 05/13/2016 0210   LDLCALC 70 05/13/2016 0210   HgbA1C  Lab Results  Component Value Date   HGBA1C 5.6 05/13/2016   Urine Drug Screen     Component Value Date/Time   LABOPIA NONE DETECTED 05/13/2016 0940   COCAINSCRNUR NONE DETECTED 05/13/2016 0940   LABBENZ POSITIVE* 05/13/2016 0940   AMPHETMU NONE DETECTED 05/13/2016 0940   THCU NONE DETECTED 05/13/2016 0940   LABBARB NONE DETECTED 05/13/2016 0940  SIGNIFICANT DIAGNOSTIC STUDIES Ct Head Wo Contrast 05/13/2016 Small acute RIGHT MCA territory infarct involving the insula with focally dense RIGHT insular MCA concerning for thromboembolism.  05/12/2016 No definite acute finding. Normal versus early loss of gray-white differentiation in the deep insula on the right. This is either aspects 9 or aspects 10.   Ct Angio Head and Neck W/cm &/or Wo Cm 05/12/2016 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 No antegrade flow beyond that in the neck. Reconstitution at the carotid siphon region probably from external to internal collaterals and flow from a patent anterior communicating artery. I think there is probably a missing branch vessel of the right MCA bifurcation with diminished distal vessel supply in the  upper insular and parietal region on the right. Atherosclerotic plaque in the ICA bulb region on the left with 40% stenosis.   Cerebral angiogram S/P bilateral common carotid and RT Vert A angiograms followed by superselective infusion of 35m of tpa and endovascul;ar revascularization of symptomatic occluded RT ICA prox with stent assisted angioplasty ,and infusion of 18 mg of IA Integrelin and 9 mg of REopro into intrastent thrombosis with gradual clearance of filling defects   MRI brain 05/13/2016 1. Extensive acute/subacute infarct involving the posterior right MCA territory. This includes much of the right primary motor cortex. 2. Additional involvement of the medial posterior right frontal lobe along the posterior cingulate gyrus. 3. Remote infarct of the right occipital lobe. 4. Extensive sinus disease likely related to intubation.  2D Echocardiogram  - Left ventricle: The cavity size was normal. Systolic function was normal. The estimated ejection fraction was in the range of 55% to 60%. Wall motion was normal; there were no regional wall motion abnormalities. Left ventricular diastolic function parameters were normal. - Atrial septum: No defect or patent foramen ovale was identified.  Impressions: - No cardiac source of emboli was indentified.  Portable Chest Xray 05/13/2016  Endotracheal tube above the carina.   ABD xray 05/13/2016  Nasogastric tube terminates at the proximal stomach with side port above the gastroesophageal junction. Recommend advancement for optimal positioning.     HISTORY OF PRESENT ILLNESS Johnathan VIGENis a 65y.o. male who was in his normal state of health earlier working outside in the lawn. He had a syncopal episode At 1705 on 05/12/2016 ([facility]). EMS was called and arrived on scene for the syncope. After their arrival, at 1728 he developed severe left-sided weakness.They called a code stroke and transported him to the ER where he was found to have a left  hemiparesis with neglect. He was given IV tPA and and taken to IR.   HOSPITAL COURSE Mr. Johnathan MAISELis a 65y.o. male with history of diabetes mellitus and tobacco history presenting with syncope and left hemiparesthesias. [place] where he had revascularization with IA tPA, integrilin and reapro with stent to occluded R ICA.   Stroke: Non-dominant R MCA territory infarct s/p IV tPA and neurointervention with R ICA stent placement. Infarct thrombotic from large vessel disease.  Resultant Left sided weakness  MRI - extensive posterior R MCA territory infarct (~30% of the brain )  CTA head and neck - 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 No antegrade flow beyond that in the neck.  Carotid Doppler - refer to CTA of the neck.  2D Echo - EF 55-560%. No source of embolus  LDL - 70  HgbA1c 5.6  No antithrombotic prior to admission, now on aspirin 325 mg daily. Given large vessel intracranial atherosclerosis and stent, patient should be treated with aspirin 325 mg and clopidogrel 75 mg orally every day  Ongoing aggressive stroke risk factor management  Patient interested in considering STROKE AF trial. [facility] will follow up. Please contact [facility] at [phone] for any questions.  Therapy recommendations: CIR  Disposition: CIR (Pt lives w. Wife, retired from [employer], no real medical care PTA).   Acute respiratory failure  Intubated for neuro intervention  Extubated after sheath removal  Carotid occlusion  R ICA occlusion  S/p emergent stent placement in IR  Hypertension  Stable  Permissive hypertension (OK if < 220/120) but gradually normalize in 5-7 days  Long-term BP goal normotensive  Hyperlipidemia  Home meds: No lipid lowering medications prior to admission  LDL 70, goal < 70   added lipitor 10 mg  Continue statin at  discharge  Other Stroke Risk Factors  Advanced age  Cigarette smoker - advised to stop smoking. added Nicotine patch (smokes 1.5 ppd)  Overweight, Body mass index is 28.02 kg/(m^2)., recommend weight loss, diet and exercise as appropriate   Family hx stroke  Other Active Problems  CKD stage 2-3a, creatinine 1.47->1.2   DISCHARGE EXAM Blood pressure 163/90, pulse 57, temperature 98.7 F (37.1 C), temperature source Oral, resp. rate 18, height 5' 6"  (1.676 m), weight 78.7 kg (173 lb 8 oz), SpO2 98 %. Constitutional: Appears well-developed and well-nourished. Patient intubated and sedated Eyes: No scleral injection HENT: No OP obstrucion Head: Normocephalic.  Cardiovascular: Normal rate and regular rhythm.  Respiratory: CTA GI: Soft. No distension.  Skin: WDI  Neuro: Mental Status: Patient is awake and interactive some simple commands.on both sides  Cranial Nerves: II: Pupils are equal, round, and reactive to light.  III,IV, VI: EOMI with OCR V: Facial sensation can not be tested VII: Facial movement appears weak on the left when observing eye closure.  VIII: hearing is intact to voice IX, X: Spontaneous cough XI: XII: not tested  Motor/Sensory Tone is normal. Bulk is normal. On the right side. Follows commands. On left mild drift LUE and LLE. Grade 3/5 strength LUE and 4/5 LLE.   Discharge Diet  Diet Heart Room service appropriate?: Yes; Fluid consistency:: Thin liquids  DISCHARGE PLAN  Disposition:  Transfer to [facility] for ongoing PT, OT and ST  aspirin 325 mg daily and clopidogrel 75 mg daily for secondary stroke prevention post stent placement  Recommend ongoing risk factor control by Primary Care Physician at time of discharge from inpatient rehabilitation.  Follow-up No primary care provider on file. in 2 weeks following discharge from rehab.  Follow-up with Dr. Antony Contras, Stroke Clinic in 2 months, office to schedule  an appointment.   35 minutes were spent preparing discharge.  Denver Casas for Pager information 05/16/2016 1:15 PM  I have personally examined this patient, reviewed notes, independently viewed imaging studies, participated in medical decision making and plan of care. I have made any additions or clarifications directly to the above note. Agree with note above.   Antony Contras, MD Medical Director [facility] Pager: [phone] 05/16/2016 1:26 PM

## 2016-05-16 NOTE — Progress Notes (Signed)
Johnathan [name] Rehab Admission Coordinator Signed Physical Medicine and Rehabilitation PMR Pre-admission 05/16/2016 1:36 PM  Related encounter: ED to Hosp-Admission (Discharged) from 05/12/2016 in [place] Collapse All   PMR Admission Coordinator Pre-Admission Assessment  Patient: Johnathan [name] is an 65 y.o., male MRN: [id number] DOB: 1951-07-05 Height: _0  (167.6 cm) Weight: 78.7 kg (173 lb 8 oz)  Insurance Information HMO: PPO: X PCP: IPA: 80/20: OTHER:  PRIMARYIval [name] Policy#: [id number] Subscriber: Spouse  CM Name: Johnathan [name] Phone#: [phone] [id number] Fax#: [phone]; stated that updated clinicals are not needed nor is prior authorization.  Addendum: Received a call from [place] is assigned to case [id number] phone:[phone]) who confirmed approval of request to admit patient and clarified that approval is for 7 days with updates due 05/23/16. Pre-Cert#: [id number] Employer: Retired  Benefits: Phone #: [phone] Name: Johnathan [name]. Date: 12/04/15- Deduct: $0 Out of Pocket Max: $6,550 Life Max: None CIR: $500 deductible then 80%/20% SNF: 80%/20% Outpatient: PT/OT/SLP Co-Pay: $20 Home Health: 80% Co-Pay: 20% DME: 80% Co-Pay: 20% Providers: in network   Medicaid Application Date: Case Manager:  Disability Application Date: Case Worker:   Emergency [place]    Name Relation Home Work Mobile   Johnathan [name]  [id number]       Current Medical History  Patient Admitting Diagnosis: Right MCA infarct  History of Present Illness: Johnathan [name] is a 65 y.o. right handed male with history of tobacco abuse on no  prescription medications. 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 They have a son also of the home who also works. One level home with 3 steps to entry. Presented 05/12/2016 with left-sided weakness. Initial CT scan showed no definite acute findings. MRI of the brain showed extensive acute subacute infarct involving the posterior right MCA territory. Additional involvement of the medial posterior right frontal lobe along the posterior cingulate gyrus. Remote infarct in the right occipital lobe. 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 Interventional radiology underwent right ICA stent 05/13/2016. Patient remained on ventilator for a short time. Echocardiogram with ejection fraction of 60% no wall motion abnormalities. No cardiac source of emboli identified. Patient did not receive TPA. Neurology services maintain on aspirin and Plavix for CVA prophylaxis. Tolerating a regular consistency diet. Physical therapy evaluation completed 05/15/2016 with recommendations of physical medicine rehabilitation consult.Patient was admitted for a comprehensive rehabilitation program.  NIH Total: 9    Past Medical History  History reviewed. No pertinent past medical history.  Family History  family history is not on file.  Prior Rehab/Hospitalizations:  Has the patient had major surgery during 100 days prior to admission? No  Current Medications   Current facility-administered medications:  . 0.9 % sodium chloride infusion, , Intravenous, Continuous, Johnathan Doom, MD, Stopped at 05/14/16 2100 . 0.9 % sodium chloride infusion, , Intravenous, Continuous, Johnathan Bras, MD, Last Rate: 75 mL/hr at 05/14/16 2100 . acetaminophen (TYLENOL) tablet 1,000 mg, 1,000 mg, Oral, Q6H PRN, 1,000 mg at 05/15/16 0225 **OR** acetaminophen (TYLENOL) suppository 650  mg, 650 mg, Rectal, Q6H PRN, Johnathan Bras, MD . antiseptic oral rinse (CPC / CETYLPYRIDINIUM CHLORIDE 0.05%) solution 7 mL, 7 mL, Mouth Rinse, q12n4p, Johnathan Fila, MD, 7 mL at 05/16/16 1257 . aspirin tablet 325 mg, 325 mg, Oral,  Q breakfast, Johnathan Bras, MD, 325 mg at 05/16/16 0845 . atorvastatin (LIPITOR) tablet 10 mg, 10 mg, Oral, q1800, Johnathan Fila, MD, 10 mg at 05/15/16 1728 . chlorhexidine gluconate (SAGE KIT) (PERIDEX) 0.12 % solution 15 mL, 15 mL, Mouth Rinse, BID, Johnathan Doom, MD, 15 mL at 05/16/16 0800 . clopidogrel (PLAVIX) tablet 75 mg, 75 mg, Oral, Q breakfast, Johnathan Bras, MD, 75 mg at 05/16/16 0845 . fentaNYL (SUBLIMAZE) injection 100 mcg, 100 mcg, Intravenous, Q15 min PRN, Johnathan Coyer, MD, 100 mcg at 05/13/16 1023 . fentaNYL (SUBLIMAZE) injection 100 mcg, 100 mcg, Intravenous, Q2H PRN, Johnathan Coyer, MD, 100 mcg at 05/14/16 0159 . labetalol (NORMODYNE,TRANDATE) injection 10 mg, 10 mg, Intravenous, Q10 min PRN, Johnathan Doom, MD, 10 mg at 05/12/16 2011 . lidocaine (XYLOCAINE) 1 % (with pres) injection, , , PRN, Johnathan Bras, MD, 10 mL at 05/12/16 2134 . midazolam (VERSED) injection 2 mg, 2 mg, Intravenous, Q2H PRN, Johnathan Brazen, MD, 2 mg at 05/13/16 1701 . nicardipine (CARDENE) 48m in 0.86% saline 2033mIV infusion (0.1 mg/ml), 5-15 mg/hr, Intravenous, Continuous, Johnathan BrasMD, Stopped at 05/13/16 0530 . nicotine (NICODERM CQ - dosed in mg/24 hours) patch 14 mg, 14 mg, Transdermal, Daily, Johnathan StarchNP, 14 mg at 05/16/16 0845 . ondansetron (ZOFRAN) injection 4 mg, 4 mg, Intravenous, Q6H PRN, Johnathan BrasMD . pantoprazole (PROTONIX) EC tablet 40 mg, 40 mg, Oral, Daily, McGreta DoomMD, 40 mg at 05/16/16 0845 . 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 Patient was completely Independent prior to admission and highly motivated to regain independence.   Home Assistive Devices / Equipment Home Assistive Devices/Equipment: None Home Equipment: Walker - 2 wheels, Crutches  Prior Device Use: Indicate devices/aids used by the patient prior to current illness, exacerbation or injury? None of the above  Prior Functional Level Prior Function Level of Independence: Independent Comments: retired from [employer] here at [employer]wife also works at [place] the patient need help bathing, dressing, using the toilet or eating? Independent  Indoor Mobility: Did the patient need assistance with walking from room to room (with or without device)? Independent  Stairs: Did the patient need assistance with internal or external stairs (with or without device)? Independent  Functional Cognition: Did the patient need help planning regular tasks such as shopping or remembering to take medications? Independent  Current Functional Level Cognition  Arousal/Alertness: Awake/alert Overall Cognitive Status: Within Functional Limits for tasks assessed Orientation Level: Oriented X4 Attention: Focused, Sustained, Selective Focused Attention: Appears intact Sustained Attention: Appears intact Selective Attention: Appears intact Memory: Appears intact Awareness: Appears intact Problem Solving: Appears intact Comments: 26/30 on MoCA   Extremity Assessment (includes  Sensation/Coordination)  Upper Extremity Assessment: LUE deficits/detail LUE Deficits / Details: brunstrom III grasp , ataxic movement, shoulder flexion ~40 degrees, decr elbow extension, decr supination / pronation LUE Sensation: decreased light touch, decreased proprioception LUE Coordination: decreased fine motor, decreased gross motor  Lower Extremity Assessment: Defer to PT evaluation LLE Deficits / Details: grossly 3-/5 LLE Sensation: decreased light touch (50% deficit)    ADLs  Overall ADL's : Needs assistance/impaired Grooming: Wash/dry face, Oral care, Moderate assistance, Standing Grooming Details (indicate cue type and reason): required total +2 (A) to remain static standing at sink to complete ADL task. Pt requires L UE support at sink when attempting to use R UE Upper Body Bathing: Moderate assistance Lower Body Bathing: Maximal assistance Lower Body Dressing: Maximal assistance, Sit to/from stand Lower Body Dressing Details (indicate cue type and reason): pt able to cross bil LE. pt required (A) to doff bil socks but demonstrates improvement and able to reach feet. Pt with ataxic movement of L UE.  Toilet Transfer: +2 for physical assistance, Maximal assistance (standard height with bil arm rest) Functional mobility during ADLs: Maximal assistance, +2 for physical assistance General ADL Comments: Pt in chair on arrival and very pleased to see therapy arrive. Pt provided mirror for visual feedback for position in space. Pt able to place L UE on knee this session with decr ataxic movement. pt working on static sitting with elongated spine and head in neutral position. pt able to hold neck flexion but demonstrates posture lean. Pt completed sit<>Stand 4 times.pt with L LE knee flexion and hip flexion today when attempting to transfer. OT /PT taking patient to outside patio for static standing. Pt demonstrates ability to do chest push ups with L LE blocked. Pt when asked to  anterior pelvic tilt into chest push up , pt demostrates knee flexion and hip flexion. PT requires weight shift and max tactile input to remain static standing    Mobility  General bed mobility comments: in chair on arrival    Transfers  Overall transfer level: Needs assistance Equipment used: 2 person hand held assist Transfers: Sit to/from Stand Sit to Stand: Max assist, +2 physical assistance General transfer comment: cues for hand placement and visual cueing to attend to L side of body. pt with decr sensation on L side. Question use of shoes to help with proprioception for somatosensory input .     Ambulation / Gait / Stairs / Wheelchair Mobility  Ambulation/Gait Ambulation/Gait assistance: Max assist, +2 physical assistance (3rd person for chair follow) Ambulation Distance (Feet): 5 Feet (from chair to sink) Assistive device: (2 person assist with gait belt) Gait Pattern/deviations: Step-to pattern, Decreased step length - left, Decreased stance time - left, Ataxic, Scissoring, Narrow base of support General Gait Details: modA to aide in advancement and placement of L LE. maxAx2 at trunk to obtain midline and maintain upright posture. pt did initiate L LE advancement but LE would adduct and crossover R Gait velocity: slow Gait velocity interpretation: Below normal speed for age/gender    Posture / Balance Balance Overall balance assessment: Needs assistance Sitting-balance support: Bilateral upper extremity supported, Feet supported Sitting balance-Leahy Scale: Poor Standing balance support: Bilateral upper extremity supported, During functional activity Standing balance-Leahy Scale: Zero Standing balance comment: requires maxAx1 to maintain standing at sink while working with OT    Special needs/care consideration BiPAP/CPAP: No CPM: No Continuous Drip IV: No Dialysis: No  Life Vest: No Oxygen: No Special Bed: No Trach Size: No Wound Vac (area):  No  Skin: Bruises Location: Bilateral upper extremities  Bowel mgmt: 05/15/16 Bladder mgmt: Continent  Diabetic mgmt: HgbA1c 5.6     Previous Home Environment Living Arrangements: Spouse/significant other Lives With: Spouse Available Help at Discharge: Family, Available PRN/intermittently Type  of Home: House Home Layout: One level Home Access: Stairs to enter Entrance Stairs-Rails: None Entrance Stairs-Number of Steps: 3 Bathroom Shower/Tub: Multimedia programmer: Standard Home Care Services: No Additional Comments: 2 dogs/ 2 cats at home  Discharge Living Setting Plans for Discharge Living Setting: Patient's home Type of Home at Discharge: House Discharge Home Layout: One level Discharge Home Access: Stairs to enter Entrance Stairs-Rails: None Entrance Stairs-Number of Steps: 2 Discharge Bathroom Shower/Tub: Horticulturist, commercial: Standard Discharge Bathroom Accessibility: Yes How Accessible: Accessible via walker Does the patient have any problems obtaining your medications?: No  Social/Family/Support Systems Patient Roles: Spouse, Parent Contact Information: Spouse: [place] [phone]; cell:[phone] Anticipated Caregiver: spouse, son and son's finace will work together to cover it with plan for patient  Anticipated Ambulance person Information: See above Ability/Limitations of Caregiver: Min physical assist  Caregiver Availability: 24/7 (short term ) Discharge Plan Discussed with Primary Caregiver: Yes Is Caregiver In Agreement with Plan?: Yes Does Caregiver/Family have Issues with Lodging/Transportation while Pt is in Rehab?: No  Goals/Additional Needs Patient/Family Goal for Rehab: PT/OT Supervision-Min assist; SLP Mod I  Expected length of stay: 20-25 days  Cultural Considerations: None Dietary Needs: None Equipment Needs: TBD Special Service Needs: None Additional  Information: N/A Pt/Family Agrees to Admission and willing to participate: Yes Program Orientation Provided & Reviewed with Pt/Caregiver Including Roles & Responsibilities: Yes Additional Information Needs: N/A Information Needs to be Provided By: N/A   Decrease burden of Care through IP rehab admission: No  Possible need for SNF placement upon discharge: Not anticipated   Patient Condition: This patient's condition remains as documented in the consult dated 05/15/16, in which the Rehabilitation Physician determined and documented that the patient's condition is appropriate for intensive rehabilitative care in an inpatient rehabilitation facility. Will admit to inpatient rehab today.  Preadmission Screen Completed By: Johnathan [name], 05/16/2016 1:36 PM ______________________________________________________________________  Discussed status with Dr. Posey Pronto on 05/16/16 at 1400 and received telephone approval for admission today.  Admission Coordinator: Johnathan [name], time 1400/Date 05/16/16          Cosigned by: Ankit Lorie Phenix, MD at 05/16/2016 2:05 PM  Revision History     Date/Time User Provider Type Action   05/16/2016 2:05 PM Ankit Lorie Phenix, MD Physician Cosign   05/16/2016 1:56 PM Johnathan [name] Rehab Admission Coordinator Sign

## 2016-05-16 NOTE — Progress Notes (Signed)
Physical Therapy Treatment Patient Details Name: Johnathan [name] MRN: [id number] DOB: 06/18/1951 Today's Date: 05/16/2016    History of Present Illness Johnathan [name] is a 65 y.o. male who was in his normal state of health earlier working outside in the yard when he had a syncopal episode and was brough to ED with L  hemiperesis and neglect.MRI revealed acute infarct in posterior R MCA and R occipital lobe.     PT Comments    Pt progressing well, remains to have L hemiparesis with absent sensation. Ambulation limited today by L LE flexor withdrawal during swing phase. Pt remains extremely motivated and would strongly benefit from CIR upon d/c.  Follow Up Recommendations  CIR     Equipment Recommendations  None recommended by PT    Recommendations for Other Services Rehab consult     Precautions / Restrictions Precautions Precautions: Fall Precaution Comments: L sided neglect Restrictions Weight Bearing Restrictions: No    Mobility  Bed Mobility               General bed mobility comments: pt up in chair  Transfers Overall transfer level: Needs assistance Equipment used: 2 person hand held assist Transfers: Sit to/from Stand Sit to Stand: Max assist;+2 physical assistance         General transfer comment: cues for hand placement and visual cueing to attend to L side of body. pt with decr sensation on L side. Question use of shoes to help with proprioception for somatosensory input .   Ambulation/Gait Ambulation/Gait assistance: Max assist;+2 physical assistance Ambulation Distance (Feet): 3 Feet (x2)   Gait Pattern/deviations: Step-through pattern;Decreased stride length Gait velocity: slow Gait velocity interpretation: Below normal speed for age/gender General Gait Details: pt with L LE withdrawling into hip/knee flexion depsite max tactile cues for optimal sequencing. trialed x2. pt able to advance better but then experciences flexor withdrawl prior to  ability to WB on it   Stairs            Wheelchair Mobility    Modified Rankin (Stroke Patients Only) Modified Rankin (Stroke Patients Only) Pre-Morbid Rankin Score: No symptoms Modified Rankin: Moderately severe disability     Balance Overall balance assessment: Needs assistance Sitting-balance support: Feet supported;Single extremity supported Sitting balance-Leahy Scale: Poor     Standing balance support: Bilateral upper extremity supported Standing balance-Leahy Scale: Zero Standing balance comment: max assist to maintain standing                    Cognition Arousal/Alertness: Awake/alert Behavior During Therapy: WFL for tasks assessed/performed Overall Cognitive Status: Within Functional Limits for tasks assessed                      Exercises General Exercises - Lower Extremity Ankle Circles/Pumps: AROM;Both;10 reps;Seated Long Arc Quad: AROM;Left;20 reps;Seated (verbal cues for slow and controlled) Other Exercises Other Exercises: worked on standing and obtaining midline and equal weight-bearing    General Comments        Pertinent Vitals/Pain Pain Assessment: No/denies pain    Home Living                      Prior Function            PT Goals (current goals can now be found in the care plan section) Acute Rehab PT Goals Patient Stated Goal: to get better Progress towards PT goals: Progressing toward goals    Frequency  Min  4X/week    PT Plan Current plan remains appropriate    Co-evaluation PT/OT/SLP Co-Evaluation/Treatment: Yes Reason for Co-Treatment: Complexity of the patient's impairments (multi-system involvement)   OT goals addressed during session: ADL's and self-care;Strengthening/ROM     End of Session Equipment Utilized During Treatment: Gait belt Activity Tolerance: Patient tolerated treatment well Patient left: in chair;with call bell/phone within reach;with chair alarm set;with family/visitor  present     Time: [id number] PT Time Calculation (min) (ACUTE ONLY): 33 min  Charges:  $Neuromuscular Re-education: 8-22 mins                    G Codes:      Johnathan [name] 05/16/2016, 1:59 PM   Johnathan [name], PT, DPT Pager #: [phone] Office #: [phone]

## 2016-05-16 NOTE — Progress Notes (Addendum)
STROKE TEAM PROGRESS NOTE   SUBJECTIVE (INTERVAL HISTORY) Patient up in the chair. No new complaints. Continues to have some LUE weakness.    OBJECTIVE Temp:  [98.7 F (37.1 C)-99.5 F (37.5 C)] 98.7 F (37.1 C) (06/14 0934) Pulse Rate:  [57-85] 57 (06/14 0934) Cardiac Rhythm:  [-] Sinus bradycardia;Bundle branch block (06/14 0700) Resp:  [16-18] 18 (06/14 0934) BP: (138-172)/(57-90) 163/90 mmHg (06/14 0934) SpO2:  [98 %-100 %] 98 % (06/14 0934)  CBC:   Recent Labs Lab 05/12/16 1828  05/13/16 0210 05/15/16 0335  WBC 10.7*  --  9.5 10.1  NEUTROABS 8.5*  --  7.1  --   HGB 16.0  < > 13.8 13.2  HCT 46.8  < > 40.9 39.9  MCV 94.0  --  95.1 95.2  PLT 247  --  222 202  < > = values in this interval not displayed.  Basic Metabolic Panel:   Recent Labs Lab 05/13/16 0210 05/15/16 0335  NA 139 140  K 3.8 3.3*  CL 108 111  CO2 24 23  GLUCOSE 113* 127*  BUN 10 8  CREATININE 1.25* 0.84  CALCIUM 7.9* 8.0*  MG  --  1.9  PHOS  --  1.3*    PHYSICAL EXAM Constitutional: Appears well-developed and well-nourished.  Patient intubated and sedated Eyes: No scleral injection HENT: No OP obstrucion Head: Normocephalic.  Cardiovascular: Normal rate and regular rhythm.  Respiratory: CTA GI: Soft. No distension.   Skin: WDI  Neuro: Mental Status: Patient is awake and interactive some simple commands.on both sides   Cranial Nerves: II: Pupils are equal, round, and reactive to light.  III,IV, VI: EOMI with OCR V: Facial sensation can not be tested VII: Facial movement appears weak on the left when observing eye closure.  VIII: hearing is intact to voice IX, X:  Spontaneous cough XI: XII: not tested   Motor/Sensory Tone is normal. Bulk is normal. On the right side. Follows commands.  On left mild drift LUE and LLE. Grade 3/5 strength LUE and 4/5 LLE. Cerebellar/Gait Not tested   ASSESSMENT/PLAN [name] is a 65 y.o. male with history of diabetes mellitus  and tobacco history presenting with syncope and left hemiparesthesias. He  received IV t-PA Saturday, 05/12/2016 at [facility] and was taken to IR where he had revascularization with IA tPA, integrilin and reapro with stent to occluded R ICA.   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 Infarct thrombotic from large vessel disease.  Resultant  Left sided weakness  MRI - extensive posterior R MCA territory infarct (~30% of the brain )  CTA head and neck - 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 No antegrade flow beyond that in the neck.  Carotid Doppler - refer to CTA of the neck.  2D Echo - EF 55-560%. No source of embolus  LDL - 70  HgbA1c 5.6  VTE prophylaxis - SCDs Diet Heart Room service appropriate?: Yes; Fluid consistency:: Thin  No antithrombotic prior to admission, now on aspirin 325 mg daily. Given large vessel intracranial atherosclerosis and stent, patient should be treated with aspirin 325 mg and clopidogrel 75 mg orally every day  Ongoing aggressive stroke risk factor management Patient interested in considering STROKE AF trial. [facility] will follow up. Please contact [facility] at [phone] for any questions.   Therapy recommendations:  CIR  Disposition:  pending (Pt lives w. Wife,  retired from [employer], no real medical care PTA). Medically ready for discharge when bed available.  Acute respiratory failure  Intubated for neuro intervention  Extubated  Carotid occlusion  R ICA occlusion  S/p emergent stent placement in IR  Hypertension  stable  Permissive hypertension (OK if < 220/120) but gradually normalize in 5-7 days  Long-term BP goal normotensive  Hyperlipidemia  Home meds:  No lipid lowering medications prior to admission  LDL 70, goal < 70   added lipitor 10 mg  Continue statin at discharge  Other Stroke Risk  Factors  Advanced age  Cigarette smoker - advised to stop smoking. 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 After hours, contact General Neurology

## 2016-05-16 NOTE — Consult Note (Signed)
   [facility] CM Inpatient Consult   05/16/2016  Johnathan [name] 25-Mar-1951 [id number]   Came by to visit [name] on behalf of Link to [facility] Care Management program for [employer] [facility] employees/dependents. He reports his wife is a Adult nurse. Discussed Link to [employer]. He denies having needs for Link to Wellness currently. Discussed that he will have telephonic follow up post hospital discharge from inpatient rehab unit. Confirmed best contact number as [phone]. Discussed the importance of following up with a Primary Care MD as well. He states he will see if he can follow up with Dr. Mariea Clonts for Primary Care. He states he is familiar with Dr. Mariea Clonts because that is who is wife goes to. Emphasized the importance of this. He is expresses understanding. Link to [employer] provided along with contact information. Appreciative of visit. Made inpatient [name] aware. Noted he will go to inpatient rehab soon.  Marthenia Rolling, MSN-Ed, RN,BSN [facility] Liaison [phone]

## 2016-05-17 ENCOUNTER — Inpatient Hospital Stay (HOSPITAL_COMMUNITY): Payer: 59 | Admitting: Occupational Therapy

## 2016-05-17 ENCOUNTER — Inpatient Hospital Stay (HOSPITAL_COMMUNITY): Payer: 59 | Admitting: Physical Therapy

## 2016-05-17 ENCOUNTER — Inpatient Hospital Stay (HOSPITAL_COMMUNITY): Payer: 59 | Admitting: Speech Pathology

## 2016-05-17 DIAGNOSIS — I63311 Cerebral infarction due to thrombosis of right middle cerebral artery: Secondary | ICD-10-CM

## 2016-05-17 LAB — CBC WITH DIFFERENTIAL/PLATELET
Basophils Absolute: 0 10*3/uL (ref 0.0–0.1)
Basophils Relative: 0 %
Eosinophils Absolute: 0.2 10*3/uL (ref 0.0–0.7)
Eosinophils Relative: 2 %
HCT: 41.1 % (ref 39.0–52.0)
Hemoglobin: 14.1 g/dL (ref 13.0–17.0)
Lymphocytes Relative: 17 %
Lymphs Abs: 1.6 10*3/uL (ref 0.7–4.0)
MCH: 31.7 pg (ref 26.0–34.0)
MCHC: 34.3 g/dL (ref 30.0–36.0)
MCV: 92.4 fL (ref 78.0–100.0)
Monocytes Absolute: 0.7 10*3/uL (ref 0.1–1.0)
Monocytes Relative: 8 %
Neutro Abs: 6.6 10*3/uL (ref 1.7–7.7)
Neutrophils Relative %: 73 %
Platelets: 254 10*3/uL (ref 150–400)
RBC: 4.45 MIL/uL (ref 4.22–5.81)
RDW: 12.5 % (ref 11.5–15.5)
WBC: 9.1 10*3/uL (ref 4.0–10.5)

## 2016-05-17 LAB — COMPREHENSIVE METABOLIC PANEL
ALT: 29 U/L (ref 17–63)
AST: 40 U/L (ref 15–41)
Albumin: 3.3 g/dL — ABNORMAL LOW (ref 3.5–5.0)
Alkaline Phosphatase: 60 U/L (ref 38–126)
Anion gap: 8 (ref 5–15)
BUN: 15 mg/dL (ref 6–20)
CO2: 25 mmol/L (ref 22–32)
Calcium: 8.5 mg/dL — ABNORMAL LOW (ref 8.9–10.3)
Chloride: 103 mmol/L (ref 101–111)
Creatinine, Ser: 0.95 mg/dL (ref 0.61–1.24)
GFR calc Af Amer: >60 mL/min (ref >60)
GFR calc non Af Amer: >60 mL/min (ref >60)
Glucose, Bld: 101 mg/dL — ABNORMAL HIGH (ref 65–99)
Potassium: 3.3 mmol/L — ABNORMAL LOW (ref 3.5–5.1)
Sodium: 136 mmol/L (ref 135–145)
Total Bilirubin: 1.5 mg/dL — ABNORMAL HIGH (ref 0.3–1.2)
Total Protein: 6.4 g/dL — ABNORMAL LOW (ref 6.5–8.1)

## 2016-05-17 MED ORDER — POTASSIUM CHLORIDE CRYS ER 20 MEQ PO TBCR
20.0000 meq | EXTENDED_RELEASE_TABLET | Freq: Every day | ORAL | Status: DC
  Administered 2016-05-17 – 2016-05-27 (×11): 20 meq via ORAL
  Filled 2016-05-17 (×11): qty 1

## 2016-05-17 MED ORDER — TRAZODONE HCL 50 MG PO TABS
50.0000 mg | ORAL_TABLET | Freq: Every evening | ORAL | Status: DC | PRN
  Administered 2016-05-17 – 2016-05-31 (×13): 50 mg via ORAL
  Filled 2016-05-17 (×12): qty 1

## 2016-05-17 NOTE — Progress Notes (Signed)
Patient information reviewed and entered into eRehab system by Mayia Megill, RN, CRRN, PPS Coordinator.  Information including medical coding and functional independence measure will be reviewed and updated through discharge.    

## 2016-05-17 NOTE — Evaluation (Signed)
Physical Therapy Assessment and Plan  Patient Details  Name: Johnathan [name] MRN: [id number] Date of Birth: 21-Jan-1951  PT Diagnosis: Abnormal posture, Abnormality of gait, Coordination disorder, Difficulty walking, Hemiparesis non-dominant and Impaired sensation Rehab Potential: Fair ELOS: 2.5-3 weeks   Today's Date: 05/17/2016 PT Individual Time: 1330-1440 PT Individual Time Calculation (min): 70 min    Problem List:  Patient Active Problem List   Diagnosis Date Noted  . Left-sided weakness 05/16/2016  . Right Carotid artery occlusion with infarction ([place]) 05/16/2016  . Essential hypertension 05/16/2016  . Hyperlipidemia LDL goal <70 05/16/2016  . Cigarette smoker 05/16/2016  . Overweight (BMI 25.0-29.9) 05/16/2016  . 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 Acute respiratory failure ([place]) 05/16/2016  . Cerebral infarction involving right middle cerebral artery ([place]) 05/16/2016  . Gait disturbance, post-stroke   . Hypokalemia   . Hypophosphatemia   . Cerebrovascular accident (CVA) due to thrombosis of right carotid artery ([place]) s/p IV tPA, R ICA stent placement, thrombectomy     Past Medical History: No past medical history on file. Past Surgical History:  Past Surgical History  Procedure Laterality Date  . Leg surgery    . Radiology with anesthesia N/A 05/12/2016    Procedure: RADIOLOGY WITH ANESTHESIA;  Surgeon: Luanne Bras, MD;  Location: [facility];  Service: Radiology;  Laterality: N/A;    Assessment & Plan Clinical Impression: Patient is a 65 y.o. right handed male with history of tobacco abuse on no prescription medications. 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 They have a son also of the home who also works. One level home with 3 steps to entry. Presented 05/12/2016 with left-sided weakness. Initial CT scan showed no definite acute  findings. MRI of the brain showed extensive acute subacute infarct involving the posterior right MCA territory. Additional involvement of the medial posterior right frontal lobe along the posterior cingulate gyrus. Remote infarct in the right occipital lobe. 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 Interventional radiology underwent right ICA stent 05/13/2016. Patient remained on ventilator for a short time. Echocardiogram with ejection fraction of 60% no wall motion abnormalities. No cardiac source of emboli identified. Patient did not receive TPA. Neurology services maintain on aspirin and Plavix for CVA prophylaxis. Tolerating a regular consistency diet.   Patient transferred to CIR on 05/16/2016 .   Patient currently requires max with mobility secondary to muscle weakness, impaired timing and sequencing, abnormal tone, motor apraxia, decreased coordination and decreased motor planning, left side neglect and decreased sitting balance, decreased standing balance, hemiplegia and decreased balance strategies.  Prior to hospitalization, patient was independent  with mobility and lived with Spouse in a House home.  Home access is 2Stairs to enter.  Patient will benefit from skilled PT intervention to maximize safe functional mobility, minimize fall risk and decrease caregiver burden for planned discharge home with 24 hour assist.  Anticipate patient will benefit from follow up [facility] at discharge.  PT - End of Session Activity Tolerance: Tolerates 30+ min activity with multiple rests Endurance Deficit: Yes PT Assessment Rehab Potential (ACUTE/IP ONLY): Fair Barriers to Discharge: Decreased caregiver support Barriers to Discharge Comments: wife works full time.  PT Patient demonstrates impairments in the following area(s): Balance;Endurance;Motor;Perception;Safety;Sensory PT Transfers Functional Problem(s): Bed Mobility;Bed to Chair;Car;Furniture;Floor PT Locomotion Functional  Problem(s): Ambulation;Wheelchair Mobility;Stairs PT Plan PT Intensity: Minimum of 1-2 x/day ,  45 to 90 minutes PT Frequency: 5 out of 7 days PT Duration Estimated Length of Stay: 2.5-3 weeks PT Treatment/Interventions: Ambulation/gait training;Balance/vestibular training;Cognitive remediation/compensation;Community reintegration;Discharge planning;Disease management/prevention;DME/adaptive equipment instruction;Functional mobility training;Neuromuscular re-education;Pain management;Patient/family education;Functional electrical stimulation;Psychosocial support;Skin care/wound management;Splinting/orthotics;Stair training;Therapeutic Activities;Therapeutic Exercise;UE/LE Strength taining/ROM;UE/LE Coordination activities;Visual/perceptual remediation/compensation;Wheelchair propulsion/positioning PT Transfers Anticipated Outcome(s): Min A  PT Locomotion Anticipated Outcome(s): Min A with RW.  PT Recommendation Follow Up Recommendations: Home health PT Patient destination: Home Equipment Recommended: Wheelchair (measurements);Wheelchair cushion (measurements);Rolling walker with 5" wheels;To be determined  Skilled Therapeutic Intervention PT performed Evaluation and initiaated treatment intervention; see below for results. Patient demonstrated significant difficulty with motor planning bed mobility and gait training and required Max A for all stand pivot transfers.   PT Evaluation Precautions/Restrictions Precautions Precautions: Fall Precaution Comments: L sided neglect Restrictions Weight Bearing Restrictions: No General   Vital Signs Pain Pain Assessment Pain Assessment: No/denies pain Home Living/Prior Functioning Home Living Available Help at Discharge: Family;Available PRN/intermittently Type of Home: House Home Access: Stairs to enter [employer] of Steps: 2 Entrance Stairs-Rails: None Home Layout: Two level;Full bath on main level Bathroom Shower/Tub: Clinical cytogeneticist: Standard Additional Comments: Patient lives on first floor.   Lives With: Spouse Prior Function Level of Independence: Independent with basic ADLs;Independent with homemaking with ambulation  Able to Take Stairs?: Yes Driving: Yes Vocation: Retired Leisure: Hobbies-yes (Comment) Comments: Works on tractors  and yard work.  Vision/Perception    L sided neglect of body and significant limb apraxia with functional movements.  Cognition Overall Cognitive Status: Within Functional Limits for tasks assessed Arousal/Alertness: Awake/alert Attention: Focused;Sustained Focused Attention: Appears intact Sustained Attention: Appears intact Selective Attention: Appears intact Memory: Appears intact Awareness: Impaired Awareness Impairment: Emergent impairment (Pt with no attempted correction of balance on 3 occasions during ADL tasks) Problem Solving: Impaired Problem Solving Impairment: Functional basic Safety/Judgment: Appears intact Sensation Sensation Light Touch: Impaired Detail Light Touch Impaired Details: Impaired LLE;Absent LLE Proprioception: Impaired Detail Proprioception Impaired Details: Impaired LLE Coordination Gross Motor Movements are Fluid and Coordinated: No Fine Motor Movements are Fluid and Coordinated: No Coordination and Movement Description:  dysmetria in LLE with targeted movement. increased difficulty with motor planning as well as mild synergistic movement patterns in the LLE Heel Shin Test: dysmetric.  Motor  Motor Motor: Hemiplegia;Abnormal postural alignment and control Motor - Skilled Clinical Observations: Moderate left hemiparesis noted in the LLE and LUE  Mobility Bed Mobility Bed Mobility: Supine to Sit;Rolling Right;Rolling Left;Sit to Supine Rolling Right: 4: Min guard Rolling Left: 4: Min guard Supine to Sit: 3: Mod assist Sit to Supine: 4: Min assist Transfers Transfers: Yes Sit to Stand: 3: Mod assist Sit to Stand  Details: Verbal cues for sequencing;Verbal cues for technique;Verbal cues for precautions/safety;Manual facilitation for placement Stand to Sit: 3: Mod assist Stand to Sit Details (indicate cue type and reason): Verbal cues for sequencing;Verbal cues for technique;Verbal cues for precautions/safety Stand Pivot Transfers: 2: Max assist Stand Pivot Transfer Details: Verbal cues for sequencing;Visual cues/gestures for precautions/safety;Verbal cues for technique;Verbal cues for precautions/safety;Verbal cues for gait pattern;Manual facilitation for placement;Tactile cues for placement Locomotion  Ambulation Ambulation: Yes Ambulation/Gait Assistance: 2: Max assist Ambulation Distance (Feet): 3 Feet Assistive device: 1 person hand held assist Ambulation/Gait Assistance Details: Verbal cues for gait pattern;Verbal cues for technique;Tactile cues for placement;Tactile cues for weight shifting;Manual facilitation for placement Gait Gait: No Stairs / Additional Locomotion Stairs: No Wheelchair Mobility Wheelchair Mobility: Yes Wheelchair Assistance: 3: Engineer, materials: Both upper extremities  Wheelchair Parts Management: Needs assistance Distance: 159f  Trunk/Postural Assessment  Cervical Assessment Cervical Assessment: Exceptions to [facility](slight cervical flexion to the right in sitting) Thoracic Assessment Thoracic Assessment: Exceptions to [facility](left thoracic elongation in sitting with right side shortening ) Lumbar Assessment Lumbar Assessment: Within Functional Limits Postural Control Postural Control: Deficits on evaluation Protective Responses: Pt with increased weightbearing over the left hip in sitting with LOB to the left during with functional tasks including supine<>sit and car transfer.   Balance Balance Balance Assessed: Yes Static Sitting Balance Static Sitting - Balance Support: Right upper extremity supported;Feet supported Static Sitting - Level of  Assistance: 5: Stand by assistance Dynamic Sitting Balance Dynamic Sitting - Balance Support: During functional activity Dynamic Sitting - Level of Assistance: 3: Mod assist Static Standing Balance Static Standing - Balance Support: Right upper extremity supported;Left upper extremity supported Static Standing - Level of Assistance: 2: Max assist Dynamic Standing Balance Dynamic Standing - Level of Assistance: 2: Max assist Extremity Assessment      RLE Assessment RLE Assessment: Within Functional Limits LLE Assessment LLE Assessment: Within Functional Limits (4+/5 in hip abduction all others 5/5)   See Function Navigator for Current Functional Status.   Refer to Care Plan for Long Term Goals  Recommendations for other services: Neuropsych  Discharge Criteria: Patient will be discharged from PT if patient refuses treatment 3 consecutive times without medical reason, if treatment goals not met, if there is a change in medical status, if patient makes no progress towards goals or if patient is discharged from hospital.  The above assessment, treatment plan, treatment alternatives and goals were discussed and mutually agreed upon: by patient and by family  ALorie Phenix6/15/2017, 9:47 PM

## 2016-05-17 NOTE — Care Management Note (Signed)
[facility] Individual Statement of Services  Patient Name:  Johnathan [name]  Date:  05/17/2016  Welcome to the [facility].  Our goal is to provide you with an individualized program based on your diagnosis and situation, designed to meet your specific needs.  With this comprehensive rehabilitation program, you will be expected to participate in at least 3 hours of rehabilitation therapies Monday-Friday, with modified therapy programming on the weekends.  Your rehabilitation program will include the following services:  Physical Therapy (PT), Occupational Therapy (OT), Speech Therapy (ST), 24 hour per day rehabilitation nursing, Therapeutic Recreaction (TR), Neuropsychology, Case Management (Social Worker), Rehabilitation Medicine, Nutrition Services and Pharmacy Services  Weekly team conferences will be held on Wednesday to discuss your progress.  Your Social Worker will talk with you frequently to get your input and to update you on team discussions.  Team conferences with you and your family in attendance may also be held.  Expected length of stay: 2.5-3 weeks  Overall anticipated outcome: supervision to min assist level  Depending on your progress and recovery, your program may change. Your Social Worker will coordinate services and will keep you informed of any changes. Your Social Worker's name and contact numbers are listed  below.  The following services may also be recommended but are not provided by the [facility] will be made to provide these services after discharge if needed.  Arrangements include referral to agencies that provide these services.  Your insurance has been verified to be:  [employer] Your primary doctor is:  To get PCP-currently None  Pertinent information will be shared with your doctor and your  insurance company.  Social Worker:  Ovidio Kin, [place] or ([phone]  Information discussed with and copy given to patient by: Elease Hashimoto, 05/17/2016, 1:32 PM

## 2016-05-17 NOTE — Evaluation (Signed)
Speech Language Pathology Assessment and Plan  Patient Details  Name: Johnathan [name] MRN: [id number] Date of Birth: 1951-09-04  SLP Diagnosis: N/A Rehab Potential: N/A ELOS: N/A   Today's Date: 05/17/2016 SLP Individual Time: 1000-1100 SLP Individual Time Calculation (min): 60 min   Problem List:  Patient Active Problem List   Diagnosis Date Noted  . Left-sided weakness 05/16/2016  . Right Carotid artery occlusion with infarction ([place]) 05/16/2016  . Essential hypertension 05/16/2016  . Hyperlipidemia LDL goal <70 05/16/2016  . Cigarette smoker 05/16/2016  . Overweight (BMI 25.0-29.9) 05/16/2016  . 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 Acute respiratory failure ([place]) 05/16/2016  . 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 Gait disturbance, post-stroke   . Hypokalemia   . Hypophosphatemia   . Cerebrovascular accident (CVA) due to thrombosis of right carotid artery ([place]) s/p IV tPA, R ICA stent placement, thrombectomy    Past Medical History: No past medical history on file. Past Surgical History:  Past Surgical History  Procedure Laterality Date  . Leg surgery    . Radiology with anesthesia N/A 05/12/2016    Procedure: RADIOLOGY WITH ANESTHESIA;  Surgeon: Luanne Bras, MD;  Location: [facility];  Service: Radiology;  Laterality: N/A;    Assessment / Plan / Recommendation Clinical Impression Johnathan [name] is a 65 y.o. right handed male with history of tobacco abuse on no prescription medications. 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 They have a son also of the home who also works. One level home with 3 steps to entry. Presented 05/12/2016 with left-sided weakness. Initial CT scan showed no definite acute findings. MRI of the brain showed extensive acute subacute infarct involving the posterior right MCA  territory. Additional involvement of the medial posterior right frontal lobe along the posterior cingulate gyrus. Remote infarct in the right occipital lobe. 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 Interventional radiology underwent right ICA stent 05/13/2016. Patient remained on ventilator for a short time. Echocardiogram with ejection fraction of 60% no wall motion abnormalities. No cardiac source of emboli identified. Patient did not receive TPA. Neurology services maintain on aspirin and Plavix for CVA prophylaxis. Tolerating a regular consistency diet. Speech-language Evaluation conducted on 05/15/2016 while acute care patient. The Montreal Cognitive Assessment (MoCA) was administered on 05/15/2016 while an acute care pt. Pt scored 26/30 (n=26+/30), indicating function within normal limits for this assessment and pt's level of education (high school grad and 1 year of college). Slight left labial weakness noted, however, speech is fully intelligible and no difficulty swallowing was observed or reported. ST to follow for oral motor strengthening exercises for labial strength and symmetry. Pt admitted to CIR on 05/16/2016. Bedside Swallow Evaluation and Speech-Language evaluation completed on 05/17/2016. Pt demonstrated intact mandibular, labial, buccal and lingual sensation, strength, symmetry and ROM. He consumed regular trials without overt s/s of oral dysphagia, demonstrated effective lingual manipulation of bolus and complete labial seal/closure. Pt states that he hasn't bitten his cheek or tongue during speech or mastication. Pt's speech was intelligible during complex conversation and he demonstrated recall of previous morning session with OT. At this time, pt's facial weakness appear resolved. No further services appear indicated at this time. Pt verbalized agreement.     Skilled Therapeutic Interventions          Bedside Swallow Evaluation and  Speech-Language Evaluation  provided. Pt appears to be within functional limits. Education provided to pt, who agrees that skilled ST isn't indicated. Nursing consulted and in agreement.    SLP Assessment  Patient does not need any further Speech Lanaguage Pathology Services    Recommendations  SLP Diet Recommendations: Age appropriate regular solids;Thin Liquid Administration via: Straw;Cup Medication Administration: Whole meds with liquid Supervision: Patient able to self feed Postural Changes and/or Swallow Maneuvers: Seated upright 90 degrees Oral Care Recommendations: Oral care BID Patient destination: Home Follow up Recommendations: None Equipment Recommended: None recommended by SLP    SLP Frequency N/A  SLP Duration  SLP Intensity  SLP Treatment/Interventions N/A  N/A  N/A   Pain N/A  Prior Functioning Cognitive/Linguistic Baseline: Within functional limits Type of Home: House  Lives With: Spouse Available Help at Discharge: Family;Available PRN/intermittently Education: HS grad, 1 year college Vocation: Retired  Function:  Eating Eating   Modified Consistency Diet: No Eating Assist Level: No help, No cues           Cognition Comprehension Comprehension assist level: Follows complex conversation/direction with no assist  Expression   Expression assist level: Expresses complex ideas: With no assist  Social Interaction Social Interaction assist level: Interacts appropriately with others - No medications needed.  Problem Solving Problem solving assist level: Solves complex problems: Recognizes & self-corrects  Memory Memory assist level: Complete Independence: No helper   Short Term Goals: No short term goals set  Refer to Care Plan for Long Term Goals  Recommendations for other services: None  Discharge Criteria: Patient will be discharged from SLP if patient refuses treatment 3 consecutive times without medical reason, if treatment goals not met, if there is a change in  medical status, if patient makes no progress towards goals or if patient is discharged from hospital.  The above assessment, treatment plan, treatment alternatives and goals were discussed and mutually agreed upon: by patient  Ashlye Oviedo 05/17/2016, 12:44 PM

## 2016-05-17 NOTE — Progress Notes (Signed)
Social Work  Social Work Assessment and Plan  Patient Details  Name: Johnathan [name] MRN: [id number] Date of Birth: [birth year]  Today's Date: 05/17/2016  Problem List:  Patient Active Problem List   Diagnosis Date Noted  . Left-sided weakness 05/16/2016  . Right Carotid artery occlusion with infarction ([place]) 05/16/2016  . Essential hypertension 05/16/2016  . Hyperlipidemia LDL goal <70 05/16/2016  . Cigarette smoker 05/16/2016  . Overweight (BMI 25.0-29.9) 05/16/2016  . 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 Acute respiratory failure ([place]) 05/16/2016  . 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 Gait disturbance, post-stroke   . Hypokalemia   . Hypophosphatemia   . Cerebrovascular accident (CVA) due to thrombosis of right carotid artery ([place]) s/p IV tPA, R ICA stent placement, thrombectomy    Past Medical History: No past medical history on file. Past Surgical History:  Past Surgical History  Procedure Laterality Date  . Leg surgery    . Radiology with anesthesia N/A 05/12/2016    Procedure: RADIOLOGY WITH ANESTHESIA;  Surgeon: Luanne Bras, MD;  Location: [facility];  Service: Radiology;  Laterality: N/A;   Social History:  reports that he has been smoking Cigarettes.  He has been smoking about 1.00 pack per day. He does not have any smokeless tobacco history on file. He reports that he does not drink alcohol or use illicit drugs.  Family / Support Systems Marital Status: Married Patient Roles: Spouse, Parent, Other (Comment) Mare Ferrari) Spouse/Significant Other: [name] [phone]  [phone] Children: Two son's Other Supports: Friends Anticipated Caregiver: Wife and son's and his fiancee between all of them can provide 24 hr  Ability/Limitations of Caregiver: None Caregiver Availability: 24/7 Family Dynamics: Close knit family who are supportive and involved with one another. They will work toegether to provide the care  tp will require at home, pt has always been independent and prided himself on doing for himself and others.  Social History Preferred language: English Religion: Non-Denominational Cultural Background: No issues Education: Surveyor, [employer] trade school Read: Yes Write: Yes Employment Status: Retired Freight forwarder Issues: No issues Guardian/Conservator: none-according to MD pt is capable of making his own decisions while here.   Abuse/Neglect Physical Abuse: Denies Verbal Abuse: Denies Sexual Abuse: Denies Exploitation of patient/patient's resources: Denies Self-Neglect: Denies  Emotional Status Pt's affect, behavior adn adjustment status: Pt is motivated and wants to recover from this stroke. He is ready to work in therapies and is glad to be here on the rehab unit. he has worked in the [employer] and has been up to rehab before, never thought he would be a patient here himself. Recent Psychosocial Issues: other health issues was not going to a MD prior to admission realizes he needs to now Pyschiatric History: No hisotry deferred depression screening due to pt is coping appropriately and wants to get to work. Will monitor and make referral to neuro-psych if needed thorughout his stay. Substance Abuse History: Tobacco plans to quit now since this has happened.  Patient / Family Perceptions, Expectations & Goals Pt/Family understanding of illness & functional limitations: Pt and wife can explain his stroke and deficits. Both are encouraged by his progress thus far and hopeful he will make more. Both talk with the MD and feel thier questions are being answered. Premorbid pt/family roles/activities: Husbamd, father, farmer, retiree, church member, etc Anticipated changes in roles/activities/participation: resume Pt/family expectations/goals: Pt states: " I want to do for myself I am not one to ask  for help."  Wife states: " I hope he does well here, but will do whatever we  need to do for him."  [employer]: None Premorbid Home Care/DME Agencies: None Transportation available at discharge: Family Resource referrals recommended: Support group (specify)  Discharge Planning Living Arrangements: Spouse/significant other, Children Support Systems: Spouse/significant other, Children, Other relatives, Friends/neighbors, Social worker community Type of Residence: Private residence Insurance Resources: Multimedia programmer (specify) Pharmacologist) Financial Resources: Family Support, Other (Comment) (pension from [place]) Financial Screen Referred: No Living Expenses: Own Money Management: Patient, Spouse Does the patient have any problems obtaining your medications?: No (was not seeing a MD prior to admission) Home Management: Wife and pt Patient/Family Preliminary Plans: Return home with wife and son and son's finacee assisting with his care. Between all of them they should be able to provide 24 hr care. Will work on getting PCP for pt while he is here to follow up with. Await team's evaluations and target discharge date. Social Work Anticipated Follow Up Needs: HH/OP, Support Group  Clinical Impression Pleasant gentleman who is motivated to recover and regain his independence. He has always been able to do for himself and is not accustomed to asking for help from others.  Supportive and involved family who are willing to assist him. Will await team's evaluations and work on discharge plans. Wife is working on getting pt a PCP for discharge.  Elease Hashimoto 05/17/2016, 1:51 PM

## 2016-05-17 NOTE — Evaluation (Signed)
Occupational Therapy Assessment and Plan  Patient Details  Name: Johnathan [name] MRN: [id number] Date of Birth: 11-29-1951  OT Diagnosis: abnormal posture, hemiplegia affecting non-dominant side and muscle weakness (generalized) Rehab Potential: Rehab Potential (ACUTE ONLY): Good ELOS: 16-18 days   Today's Date: 05/17/2016 OT Individual Time: 4098-1191 OT Individual Time Calculation (min): 62 min     Problem List:  Patient Active Problem List   Diagnosis Date Noted  . Left-sided weakness 05/16/2016  . Right Carotid artery occlusion with infarction ([place]) 05/16/2016  . Essential hypertension 05/16/2016  . Hyperlipidemia LDL goal <70 05/16/2016  . Cigarette smoker 05/16/2016  . Overweight (BMI 25.0-29.9) 05/16/2016  . 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 Acute respiratory failure ([place]) 05/16/2016  . 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 Gait disturbance, post-stroke   . Hypokalemia   . Hypophosphatemia   . Cerebrovascular accident (CVA) due to thrombosis of right carotid artery ([place]) s/p IV tPA, R ICA stent placement, thrombectomy     Past Medical History: No past medical history on file. Past Surgical History:  Past Surgical History  Procedure Laterality Date  . Leg surgery    . Radiology with anesthesia N/A 05/12/2016    Procedure: RADIOLOGY WITH ANESTHESIA;  Surgeon: Luanne Bras, MD;  Location: [facility];  Service: Radiology;  Laterality: N/A;    Assessment & Plan Clinical Impression: Patient is a 65 y.o. year old male with recent admission to the hospital on 05/12/2016 with left-sided weakness. Initial CT scan showed no definite acute findings. MRI of the brain showed extensive acute subacute infarct involving the posterior right MCA territory. Additional involvement of the medial posterior right frontal lobe along the posterior cingulate gyrus. Remote infarct in the right occipital lobe. CT angiogram head and neck  showed occlusion of the right ICA in the bulb. Interventional radiology underwent right ICA stent 05/13/2016.  Patient transferred to CIR on 05/16/2016 .    Patient currently requires max with basic self-care skills secondary to muscle weakness, impaired timing and sequencing, unbalanced muscle activation, motor apraxia, decreased coordination and decreased motor planning, left side neglect and decreased motor planning and decreased sitting balance, decreased standing balance, decreased postural control, hemiplegia and decreased balance strategies.  Prior to hospitalization, patient could complete ADLs and IADLs with independent .  Patient will benefit from skilled intervention to decrease level of assist with basic self-care skills and increase independence with basic self-care skills prior to discharge home with care partner.  Anticipate patient will require 24 hour supervision and follow up outpatient.  OT - End of Session Activity Tolerance: Tolerates 30+ min activity with multiple rests Endurance Deficit: Yes OT Assessment Rehab Potential (ACUTE ONLY): Good Barriers to Discharge: Decreased caregiver support Barriers to Discharge Comments: will likely need to arrange 24 hour supervision OT Patient demonstrates impairments in the following area(s): Balance;Motor;Endurance;Perception;Safety;Sensory OT Basic ADL's Functional Problem(s): Eating;Grooming;Bathing;Dressing;Toileting OT Transfers Functional Problem(s): Toilet;Tub/Shower OT Additional Impairment(s): Fuctional Use of Upper Extremity OT Plan OT Intensity: Minimum of 1-2 x/day, 45 to 90 minutes OT Frequency: 5 out of 7 days OT Duration/Estimated Length of Stay: 16-18 days OT Treatment/Interventions: Balance/vestibular training;Neuromuscular re-education;Self Care/advanced ADL retraining;UE/LE Strength taining/ROM;Therapeutic Exercise;Patient/family education;DME/adaptive equipment instruction;Community reintegration;Discharge  planning;Therapeutic Activities;Functional mobility training;UE/LE Coordination activities OT Self Feeding Anticipated Outcome(s): modified independent level OT Basic Self-Care Anticipated Outcome(s): supervision OT Toileting Anticipated Outcome(s): supervision OT Bathroom Transfers Anticipated Outcome(s): supervision OT Recommendation Patient destination: Home Follow Up Recommendations: 24 hour supervision/assistance Equipment Recommended: To be  determined   Skilled Therapeutic Intervention Pt began working on selfcare re-training sit to stand during session.  Pt ambulated to the shower seat with max assist hand held.  Increased forward trunk flexion with LOB to the left and forward as well.  He also demonstrates decreased control when advancing the LLE as well requiring min assist at times, with max assist to maintain stance phase while advancing the RLE.  Frequent LOB to the left while washing on the tub bench with max assist needed to recognize and return to midline.  Mod hand over hand assistance for washing the RUE with the LUE.  During dressing tasks pt with decreased awareness that he had only partially placed the LLE in his underpants and pants leg before attempting to place the right.  Garment continued to get stuck on the left side but he did not attempt to self correct.  When donning pants he placed the RLE in the left pants leg on 3 occasions before therapist finally had to assist him.  Mod assist for standing to pull pants and underpants over hips.   Discussed expectations of goals and the need for 24 hour supervision at discharge.  Pt left in bedside recliner with safety belt in place and call button in lap.  Educated pt on the need to position the LUE on pillows when resting secondary to already having noticeable bruises.     OT Evaluation Precautions/Restrictions  Precautions Precautions: Fall Precaution Comments: L sided neglect Restrictions Weight Bearing Restrictions:  No  Pain Pain Assessment Pain Assessment: No/denies pain Pain Score: 0-No pain Home Living/Prior Functioning Home Living Family/patient expects to be discharged to:: Private residence Living Arrangements: Spouse/significant other, Children Available Help at Discharge: Family, Available PRN/intermittently Type of Home: House Home Access: Stairs to enter [employer] of Steps: 2 Entrance Stairs-Rails: None Home Layout: Two level, Full bath on main level Bathroom Shower/Tub: Multimedia programmer: Standard Additional Comments: Patient lives on first floor.   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  ADL  See Function Section of chart  Vision/Perception  Vision- History Baseline Vision/History: No visual deficits Patient Visual Report: No change from baseline Vision- Assessment Vision Assessment?: Vision impaired- to be further tested in functional context  Cognition Overall Cognitive Status: Within Functional Limits for tasks assessed Arousal/Alertness: Awake/alert Year: 2017 Month: June Day of Week: Correct Memory: Appears intact Immediate Memory Recall: Sock;Blue;Bed Memory Recall: Sock;Blue Memory Recall Sock: Without Cue Memory Recall Blue: Without Cue Attention: Focused;Sustained Focused Attention: Appears intact Sustained Attention: Appears intact Selective Attention: Appears intact Awareness: Impaired Awareness Impairment: Emergent impairment (Pt with no attempted correction of balance on 3 occasions during ADL tasks) Problem Solving: Impaired Problem Solving Impairment: Functional basic Safety/Judgment: Appears intact Comments: 26/30 on MoCA on  04/14/2016 Sensation Sensation Light Touch: Impaired Detail Light Touch Impaired Details: Impaired LUE Stereognosis: Not tested Hot/Cold: Not tested Proprioception: Impaired Detail Proprioception Impaired Details: Impaired LUE Coordination Gross Motor Movements are Fluid and Coordinated: No Fine Motor Movements are Fluid and Coordinated: No Coordination and Movement Description: Pt with greater distal function of the left hand then with AROM at the left shoulder.  He was able to use as a stabilizer for holding soap to be opened but demonstrated significant motor planning deficits when  attempting to grasp items and wash while holding a washcloth with the LUE. Heel Shin Test: dysmetric.  Motor  Motor Motor: Hemiplegia;Abnormal postural alignment and control Motor - Skilled Clinical Observations: Moderate left hemiparesis noted in the LLE and LUE Mobility  Bed Mobility Bed Mobility: Supine to Sit Supine to Sit: 4: Min assist;HOB flat Supine to Sit Details: Tactile cues for sequencing;Manual facilitation for weight shifting Transfers Transfers: Sit to Stand;Stand to Sit Sit to Stand: 3: Mod assist;With upper extremity assist;From bed;From chair/3-in-1 Sit to Stand Details: Manual facilitation for weight shifting Stand to Sit: 3: Mod assist;With upper extremity assist;To chair/3-in-1;To bed Stand to Sit Details (indicate cue type and reason): Manual facilitation for weight shifting  Trunk/Postural Assessment  Cervical Assessment Cervical Assessment: Exceptions to [facility] (slight cervical flexion to the right in sitting) Thoracic Assessment Thoracic Assessment: Exceptions to [facility] (left thoracic elongation in sitting with right side shortening ) Lumbar Assessment Lumbar Assessment: Within Functional Limits Postural Control Postural Control: Deficits on evaluation Protective Responses: Pt with increased weightbearing over the left hip in sitting with LOB to the left during dynamic tasks such  as removing or donning cothing.   Balance Balance Balance Assessed: Yes Static Sitting Balance Static Sitting - Balance Support: Right upper extremity supported;Feet supported Static Sitting - Level of Assistance: 5: Stand by assistance Dynamic Sitting Balance Dynamic Sitting - Balance Support: During functional activity Dynamic Sitting - Level of Assistance: 3: Mod assist Static Standing Balance Static Standing - Balance Support: Right upper extremity supported;Left upper extremity supported Static Standing - Level of Assistance: 2: Max assist Dynamic Standing Balance Dynamic Standing - Balance Support: Right upper extremity supported Dynamic Standing - Level of Assistance: 2: Max assist Extremity/Trunk Assessment RUE Assessment RUE Assessment: Within Functional Limits LUE Assessment LUE Assessment: Exceptions to [facility] LUE Strength LUE Overall Strength Comments: Pt with AROM WFLS for gross digit flexion and extension.  He is able to oppose the thumb to all digits as well.  AROM shoulder flexion however exhibits synergy pattern of level III-IV Brunnstrum.  AAROM WFLS at the elbow and shoulder as well.  Increased motor planning deficits also noted with pt when attempting to folow manual muscle testing.  Needs mod assist for functional use of the LUE with bathing and dressing tasks at this time.     See Function Navigator for Current Functional Status.   Refer to Care Plan for Long Term Goals  Recommendations for other services: None  Discharge Criteria: Patient will be discharged from OT if patient refuses treatment 3 consecutive times without medical reason, if treatment goals not met, if there is a change in medical status, if patient makes no progress towards goals or if patient is discharged from hospital.  The above assessment, treatment plan, treatment alternatives and goals were discussed and mutually agreed upon: by patient  Umeka Wrench OTR/L 05/17/2016, 5:00 PM

## 2016-05-17 NOTE — Progress Notes (Signed)
65 y.o. right handed male with history of tobacco abuse on no prescription medications. 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 They have a son also of the home who also works. One level home with 3 steps to entry. Presented 05/12/2016 with left-sided weakness. Initial CT scan showed no definite acute findings. MRI of the brain showed extensive acute subacute infarct involving the posterior right MCA territory. Additional involvement of the medial posterior right frontal lobe along the posterior cingulate gyrus. Remote infarct in the right occipital lobe. 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 Interventional radiology underwent right ICA stent 05/13/2016. Patient remained on ventilator for a short time. Echocardiogram with ejection fraction of 60% no wall motion abnormalities  Subjective/Complaints: No issues overnite Review of Systems - Negative except insomnia, denies CP/SOB/ bowel or bladder issues  Objective: Vital Signs: Blood pressure 162/89, pulse 54, temperature 98.1 F (36.7 C), temperature source Oral, resp. rate 16, height 5' 6" (1.676 m), weight 73.12 kg (161 lb 3.2 oz), SpO2 95 %. No results found. Results for orders placed or performed during the hospital encounter of 05/16/16 (from the past 72 hour(s))  CBC WITH DIFFERENTIAL     Status: None   Collection Time: 05/17/16  5:39 AM  Result Value Ref Range   WBC 9.1 4.0 - 10.5 K/uL   RBC 4.45 4.22 - 5.81 MIL/uL   Hemoglobin 14.1 13.0 - 17.0 g/dL   HCT 41.1 39.0 - 52.0 %   MCV 92.4 78.0 - 100.0 fL   MCH 31.7 26.0 - 34.0 pg   MCHC 34.3 30.0 - 36.0 g/dL   RDW 12.5 11.5 - 15.5 %   Platelets 254 150 - 400 K/uL   Neutrophils Relative % 73 %   Neutro Abs 6.6 1.7 - 7.7 K/uL   Lymphocytes Relative 17 %   Lymphs Abs 1.6 0.7 - 4.0 K/uL   Monocytes Relative 8 %   Monocytes Absolute 0.7 0.1  - 1.0 K/uL   Eosinophils Relative 2 %   Eosinophils Absolute 0.2 0.0 - 0.7 K/uL   Basophils Relative 0 %   Basophils Absolute 0.0 0.0 - 0.1 K/uL  Comprehensive metabolic panel     Status: Abnormal   Collection Time: 05/17/16  5:39 AM  Result Value Ref Range   Sodium 136 135 - 145 mmol/L   Potassium 3.3 (L) 3.5 - 5.1 mmol/L   Chloride 103 101 - 111 mmol/L   CO2 25 22 - 32 mmol/L   Glucose, Bld 101 (H) 65 - 99 mg/dL   BUN 15 6 - 20 mg/dL   Creatinine, Ser 0.95 0.61 - 1.24 mg/dL   Calcium 8.5 (L) 8.9 - 10.3 mg/dL   Total Protein 6.4 (L) 6.5 - 8.1 g/dL   Albumin 3.3 (L) 3.5 - 5.0 g/dL   AST 40 15 - 41 U/L   ALT 29 17 - 63 U/L   Alkaline Phosphatase 60 38 - 126 U/L   Total Bilirubin 1.5 (H) 0.3 - 1.2 mg/dL   GFR calc non Af Amer >60 >60 mL/min   GFR calc Af Amer >60 >60 mL/min    Comment: (NOTE) The eGFR has been calculated using the CKD EPI equation. This calculation has not been validated in all clinical situations. eGFR's persistently <60 mL/min signify possible Chronic Kidney Disease.    Anion gap 8 5 - 15  HEENT: normal Cardio: RRR and no murmur Resp: CTA B/L and unlabored GI: BS positive and NT, ND Extremity:  No Edema Skin:   Bruise left forearm Neuro: Alert/Oriented, Abnormal Sensory absent LT, LUE and LLE, Abnormal Motor 3- LUE, 4- LLE, Abnormal FMC Ataxic/ dec FMC and Inattention Musc/Skel:  Normal Gen NAD   Assessment/Plan: 1. 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 Physiatrist is providing close team supervision and 24 hour management of active medical problems listed below. Physiatrist and rehab team continue to assess barriers to discharge/monitor patient progress toward functional and medical goals. FIM:       Function - Toileting Toileting steps completed by patient: Adjust  clothing prior to toileting, Adjust clothing after toileting, Performs perineal hygiene           Function - Comprehension Comprehension: Auditory Comprehension assist level: Follows complex conversation/direction with extra time/assistive device  Function - Expression Expression: Verbal Expression assist level: Expresses complex ideas: With extra time/assistive device  Function - Social Interaction Social Interaction assist level: Interacts appropriately with others - No medications needed.  Function - Problem Solving Problem solving assist level: Solves complex 90% of the time/cues < 10% of the time  Function - Memory Memory assist level: Complete Independence: No helper Patient normally able to recall (first 3 days only): Current season, Location of own room, Staff names and faces, That he or she is in a hospital   Medical Problem List and Plan: 1.  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  DVT Prophylaxis/Anticoagulation: SCDs. Monitor for any signs of DVT 3. Pain Management: Tylenol as needed 4. Hypertension. Permissive hypertension and monitor with increased mobility 5. Neuropsych: This patient is capable of making decisions on his own behalf. 6. Skin/Wound Care: Routine skin checks 7. Fluids/Electrolytes/Nutrition: Routine I&O's with follow-up chemistries 8. Hyperlipidemia. Lipitor 9. Tobacco abuse. Nicoderm patch. Provided counseling. 10. Hypokalemia: Cont to monitor and replete as necessary.will increase KCL supplementation 11. Hypophosphatemia: Cont to monitor and replete as necessary. 12. ?CKD: GFR and Creat now normalized   LOS (Days) 1 A FACE TO FACE EVALUATION WAS PERFORMED  KIRSTEINS,ANDREW E 05/17/2016, 7:29 AM

## 2016-05-18 ENCOUNTER — Inpatient Hospital Stay (HOSPITAL_COMMUNITY): Payer: 59 | Admitting: Occupational Therapy

## 2016-05-18 ENCOUNTER — Inpatient Hospital Stay (HOSPITAL_COMMUNITY): Payer: 59 | Admitting: *Deleted

## 2016-05-18 ENCOUNTER — Inpatient Hospital Stay (HOSPITAL_COMMUNITY): Payer: 59 | Admitting: Physical Therapy

## 2016-05-18 ENCOUNTER — Encounter: Payer: Self-pay | Admitting: *Deleted

## 2016-05-18 DIAGNOSIS — Z006 Encounter for examination for normal comparison and control in clinical research program: Secondary | ICD-10-CM

## 2016-05-18 MED ORDER — PRO-STAT SUGAR FREE PO LIQD
30.0000 mL | Freq: Two times a day (BID) | ORAL | Status: DC
  Administered 2016-05-18 – 2016-05-22 (×8): 30 mL via ORAL
  Filled 2016-05-18 (×7): qty 30

## 2016-05-18 NOTE — Progress Notes (Signed)
   05/18/16 1543  What Happened  Was fall witnessed? No  Was patient injured? Yes  Patient found on floor  Found by Staff-comment Lina Sar, [place])  Stated prior activity bathroom-unassisted (leaning forward to reach urinal from recliner)  Follow Up  MD notified Algis Liming, PA  Time MD notified [phone]  Family notified No- patient refusal  Additional tests No  Simple treatment Dressing  Progress note created (see row info) Yes  Adult Fall Risk Assessment  Risk Factor Category (scoring not indicated) Fall has occurred during this admission (document High fall risk)  Patient's Fall Risk High Fall Risk (>13 points)  Adult Fall Risk Interventions  Required Bundle Interventions *See Row Information* High fall risk - low, moderate, and high requirements implemented  Additional Interventions PT/OT need assessed if change in mobility from baseline;Individualized elimination schedule;Fall risk signage  Vitals  Temp 98.1 F (36.7 C)  Temp Source Oral  BP (!) 149/79 mmHg  BP Location Right Arm  BP Method Automatic  Patient Position (if appropriate) Sitting  Pulse Rate 73  Pulse Rate Source Dinamap  Resp 16  Oxygen Therapy  SpO2 100 %  O2 Device Room Air  Pain Assessment  Pain Assessment 0-10  Pain Score 0  PCA/Epidural/Spinal Assessment  Respiratory Pattern Regular  Neurological  Neuro (WDL) X  Level of Consciousness Alert  Orientation Level Oriented X4  Cognition Appropriate at baseline;Poor safety awareness  Speech Clear  R Hand Grip Strong  L Hand Grip Moderate   LUE Motor Strength 2  LLE Motor Strength 3  Neuro Symptoms None  Musculoskeletal  Musculoskeletal (WDL) X  Assistive Device Wheelchair  Generalized Weakness Yes  Weight Bearing Restrictions No  Integumentary  Integumentary (WDL) X  Skin Color Appropriate for ethnicity  Skin Condition Dry  Skin Integrity Skin tear  Skin Tear Location Elbow  Skin Tear Location Orientation Left  Skin Tear Intervention  Gauze;Thin film   Patient found on floor in front of recliner chair by Gilman Buttner, NT.  Patient states he was reaching forward to get the urinal.  The quick release belt was unhooked by the patient.  Brakes on recliner chair noted to be unlocked.  Patient has skin tear on left elbow- dressed with gauze and tegaderm.  Patient denies pain, denies hitting head.  Patient declined having RN call his spouse.  Assisted patient to lying position in bed.  VSS.  Post fall protocol initiated.  Algis Liming, PA notified, no new orders written at this time.  Will continue to monitor per protocol.  Brita Romp, RN

## 2016-05-18 NOTE — Progress Notes (Signed)
Physical Therapy Session Note  Patient Details  Name: Johnathan [name] MRN: [id number] Date of Birth: 06/16/1951  Today's Date: 05/18/2016 PT Individual Time: 1000-1100 PT Individual Time Calculation (min): 60 min   Short Term Goals: Week 1:  PT Short Term Goal 1 (Week 1): Patient will perform bed mobility with min A from PT  PT Short Term Goal 2 (Week 1): Patient will performed stand pivot transfer with mod A  PT Short Term Goal 3 (Week 1): Patient will ambulate for 25 ft with max A and LRAD  PT Short Term Goal 4 (Week 1): Patient will propel WC for 155ft with supervision A   Skilled Therapeutic Interventions/Progress Updates:    Tx focused on functional mobility training, gait in // bars, and NMR via forced use, manual facilitation, and multi-modal cues for midline orientation and L-sided NMR.  Pt slouched in WC upon arrival. Pt alert and oriented, able to verbalize primary physical impairments, but needing safety cues throughout.   Pt instructed in hemi-technique WC propulsion with cues for technique and steering x125' with min A for turns. Pt needs verbal and tactile cues for scooting back in WC throughout tx.   NMR in // bars for balance and L-sided motor control. Mirror use for visual feedback. Pt needed Min/Mod A for static tasks and Mod/max A for dynamic balance. Consistent cues for using mirror for correction - sit<>stands with up to Mod A for midline orientation and multi-modal cues for L-sided positioning and balance - static stance x2 min with cues for midline orientation  - lateral weight shifts x10 each direciton with cues to control L shift - LLE marching with R hip "glued to the bar" for R weight shift and control. Pt unable to maintain R hip contact throughout entire phase.  - Seated LUE reach/place horseshoe task during rest break.  - Standing with LLE on foam block during RUE reaching/placing task for horseshoes to increase R weight shift  Gait training at hall rail with  up to Mod A and mirror for visual feedback 2x25' forwards and 2x25' sidestepping back to chair towards R. Rest break between trials. Pt needed cues for full LLE ext in stance.    Pt left up in [facility] with lap belt and all needs in reach, reviewed safety precautions.    Therapy Documentation Precautions:  Precautions Precautions: Fall Precaution Comments: L sided neglect Restrictions Weight Bearing Restrictions: No   Pain: Pain Assessment Pain Assessment: No/denies pain Pain Score: 0-No pain   See Function Navigator for Current Functional Status.   Therapy/Group: Individual Therapy  Daimen Shovlin, Corinna Lines, PT, DPT  05/18/2016, 9:06 AM

## 2016-05-18 NOTE — Progress Notes (Signed)
Occupational Therapy Session Note  Patient Details  Name: Johnathan [name] MRN: [id number] Date of Birth: 1951/06/08  Today's Date: 05/18/2016 OT Individual Time:  - 60 minutes   , Today's Date: 05/18/2016 OT Group Time:  -    , Today's Date: 05/18/2016 OT Concurrent Time:  -    and Today's Date: 05/18/2016 OT Co-Treatment Time:  -      Short Term Goals: Week 1:  OT Short Term Goal 1 (Week 1): Pt will complete UB bathing with supervision sitting unsupported. OT Short Term Goal 2 (Week 1): Pt will perform LB bathing sit to stand with min assist. OT Short Term Goal 3 (Week 1): Pt will perform stand pivot transfers to the shower with min assist. OT Short Term Goal 4 (Week 1): Pt will complete LB dressing with mod assist sit to stand.  OT Short Term Goal 5 (Week 1): Pt will use the LUE as a gross assist for selfcare tasks with supervision.   Week 2:    Week 3:    Week 4:     Skilled Therapeutic Interventions/Progress Updates:    Pt seen in AM for ADL retraining. Pt ambulated to bathroom but due to poor postural control required Total Ax2 for maintaining balance and for transferring to shower bench. Today during shower pt presented with poor proximal stability for distal mobility and therefore required additional A for dynamic standing and dynamic sitting demands of UB/LB bathing. Min A required for maintaining sitting balance on tub bench due to L sided weakness. Vcs required for integrating L UE into bathing tasks. Significant gross motor coordination and proprioception deficits observed during dressing that resulted in completion with Max A. Tx today focused on left visual field scanning due to neglect, increasing postural awareness with visual biofeedback and tactile cues provided at knee and pelvis in order to facilitate typical postural alignment. Standing posture is poor at this time with pt exhibited strong L lean and L ankle eversion and requires Max A for standing balance.Though pt  presents with complex array of deficits, rehab potential is excellent due to pt motivation and increased cognitive awareness for carryover of education. Pt continues to benefit from postural retraining to regain typical movement patterns and prevent abnormal compensatory movements during self care completion. Pt left at end of session with QRB in reclining chair with call bell and phone within reach.    Therapy Documentation Precautions:  Precautions Precautions: Fall Precaution Comments: L sided neglect Restrictions Weight Bearing Restrictions: No    Pain: Denies pain   :  :    See Function Navigator for Current Functional Status.   Therapy/Group: Individual Therapy  Arnesia Vincelette A Laurel Smeltz 05/18/2016, 2:40 PM

## 2016-05-18 NOTE — Progress Notes (Signed)
   STROKE -AF Research Study reviewed with patient. Questions encouraged and answered. Subject met inclusion and exclusion criteria. The informed consent form, study requirements and expectations were reviewed with the subject and questions and concerns were addressed prior to the signing of the consent form. The subject verbalized understanding of the trail requirements. The subject agreed to participate in the STROKE-AF trial and signed the informed consent. The informed consent was obtained prior to performance of any protocol-specific procedures for the subject. A copy of the signed informed consent was given to the subject and a copy was placed in the subject's medical record. Patient was Randomized to Standard of Care [facility]).Patient also declined the Biomarker blood sampling portion of the research study.        Jake Bathe, RN 05/17/2016

## 2016-05-18 NOTE — Progress Notes (Signed)
Physical Therapy Session Note  Patient Details  Name: Johnathan [name] MRN: [id number] Date of Birth: 1951/08/03  Today's Date: 05/18/2016 PT Individual Time: 1100-1200 PT Individual Time Calculation (min): 60 min   Short Term Goals: Week 1:  PT Short Term Goal 1 (Week 1): Patient will perform bed mobility with min A from PT  PT Short Term Goal 2 (Week 1): Patient will performed stand pivot transfer with mod A  PT Short Term Goal 3 (Week 1): Patient will ambulate for 25 ft with max A and LRAD  PT Short Term Goal 4 (Week 1): Patient will propel WC for 152ft with supervision A   Skilled Therapeutic Interventions/Progress Updates:    Pt received seated in w/c, denies pain and agreeable to treatment. W/c propulsion to/from gym 2x150' with minA and mod verbal cues for navigation due to mild L inattention and running into obstacles on L side.   Squat pivot transfer w/c <>bed in ADL apartment with modA decreased to min guard by final trial.   Transfer w/c <>nustep min guard with mod verbal cues for technique. Nustep x10 min with BUE/BLE and L extremity attachments to assist with alignment; performed for LUE/LLE NMR and coordination.   Supine bridging with ball squeeze, no ball squeeze, and with alternating LE marching 1 set 10 reps each. Pt has difficulty maintaining LLE in midline.   Pt remained seated in w/c at completion of session. After therapist left room, pt attempted to move around room in w/c and began sliding out of seat and therapist notified. Assisted pt in scooting back in chair and quick release belt left intact; recommended pt call for assistance prior to navigating around room and pt agreeable.   Therapy Documentation Precautions:  Precautions Precautions: Fall Precaution Comments: L sided neglect Restrictions Weight Bearing Restrictions: No Pain: Pain Assessment Pain Assessment: No/denies pain   See Function Navigator for Current Functional Status.   Therapy/Group:  Individual Therapy  Luberta Mutter 05/18/2016, 12:22 PM

## 2016-05-18 NOTE — IPOC Note (Signed)
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   Cerebral infarction involving right middle cerebral artery (HCC)   Gait disturbance, post-stroke   Hypokalemia   Hypophosphatemia     Functional Problem List: Nursing Edema, Endurance, Skin Integrity, Medication Management, Motor, Safety, Nutrition  PT Balance, Endurance, Motor, Perception, Safety, Sensory  OT Balance, Motor, Endurance, Perception, Safety, Sensory  SLP    TR         Basic ADL's: OT Eating, Grooming, Bathing, Dressing, Toileting     Advanced  ADL's: OT       Transfers: PT Bed Mobility, Bed to Chair, Car, [employer], Futures trader, Metallurgist: PT Ambulation, Emergency planning/management officer, Stairs     Additional Impairments: OT Fuctional Use of Upper Extremity  SLP None      TR      Anticipated Outcomes Item Anticipated Outcome  Self Feeding modified independent level  Swallowing      Basic self-care  supervision  Toileting  supervision   Bathroom Transfers supervision  Bowel/Bladder  Min assist  Transfers  Min A   Locomotion  Min A with RW.   Communication     Cognition     Pain  < 4  Safety/Judgment  Supervision   Therapy Plan: PT Intensity: Minimum of 1-2 x/day ,45 to 90 minutes PT Frequency: 5 out of 7 days PT Duration Estimated Length of Stay: 2.5-3 weeks OT Intensity: Minimum of 1-2 x/day, 45 to 90 minutes OT Frequency: 5 out of 7 days OT Duration/Estimated Length of Stay: 16-18 days         Team  Interventions: Nursing Interventions Patient/Family Education, Medication Management, Disease Management/Prevention, Cognitive Remediation/Compensation, Skin Care/Wound Management  PT interventions Ambulation/gait training, Training and development officer, Cognitive remediation/compensation, Community reintegration, Discharge planning, Disease management/prevention, DME/adaptive equipment instruction, Functional mobility training, Neuromuscular re-education, Pain management, Patient/family education, Functional electrical stimulation, Psychosocial support, Skin care/wound management, Splinting/orthotics, Stair training, Therapeutic Activities, Therapeutic Exercise, UE/LE Strength taining/ROM, UE/LE Coordination activities, Visual/perceptual remediation/compensation, Wheelchair propulsion/positioning  OT Interventions Training and development officer, Neuromuscular re-education, Self Care/advanced ADL retraining, UE/LE Strength taining/ROM, Therapeutic Exercise, Patient/family education, DME/adaptive equipment instruction, Community reintegration, Discharge planning, Therapeutic Activities, Functional mobility training, UE/LE Coordination activities  SLP Interventions    TR Interventions    SW/CM Interventions Discharge Planning, Psychosocial Support, Patient/Family Education    Team Discharge Planning: Destination: PT-Home ,OT- Home , SLP-Home Projected Follow-up: PT-Home health PT, OT-  24 hour supervision/assistance, SLP-None Projected Equipment Needs: PT-Wheelchair (measurements), Wheelchair cushion (measurements), Rolling walker with 5" wheels, To be determined, OT- To be determined, SLP-None recommended by SLP Equipment Details: PT- , OT-  Patient/family involved in discharge planning: PT- Patient, Family member/caregiver,  OT-Patient, SLP-Patient  MD ELOS: 17-20 days Medical Rehab Prognosis:  Excellent Assessment: The patient has been admitted for CIR therapies with the diagnosis of right MCA infarct.  The team will be addressing functional mobility, strength, stamina, balance, safety, adaptive techniques and equipment, self-care, bowel and bladder mgt, patient and caregiver education, NMR, visual-spatial awareness, motor-planning, community reintegration. Goals have been set at supervision  to min assist for self-care/ADL's and mobility.    Meredith Staggers, MD, FAAPMR      See Team Conference Notes for weekly updates to the plan of care

## 2016-05-18 NOTE — Consult Note (Signed)
   [facility] CM Inpatient Consult   05/18/2016  ALIREZA OLAIZ 1951/04/18 [id number]   Came by to see Mr. Kubes. [facility] Care Management/Link to Wellness follow up for [place] employees/dependents with [facility]. He is currently off the unit for therapy. Will come back at a later time. Will touch base with rehab Licensed CSW.   Marthenia Rolling, MSN-Ed, RN,BSN [facility] Liaison [phone]

## 2016-05-18 NOTE — Progress Notes (Signed)
Patient administered prn trazodone last night, but still unable to sleep well. Patient states he sleeps "better in recliner chair." Patient admits to having sleeping difficulties as a baseline. No complaints of pain noted. No acute distress.

## 2016-05-18 NOTE — Progress Notes (Signed)
65 y.o. right handed male with history of tobacco abuse on no prescription medications. 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 They have a son also of the home who also works. One level home with 3 steps to entry. Presented 05/12/2016 with left-sided weakness. Initial CT scan showed no definite acute findings. MRI of the brain showed extensive acute subacute infarct involving the posterior right MCA territory. Additional involvement of the medial posterior right frontal lobe along the posterior cingulate gyrus. Remote infarct in the right occipital lobe. 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 Interventional radiology underwent right ICA stent 05/13/2016. Patient remained on ventilator for a short time. Echocardiogram with ejection fraction of 60% no wall motion abnormalities  Subjective/Complaints: Slept poorly , mattress has a dip in it Review of Systems - Negative except insomnia, denies CP/SOB/ bowel or bladder issues  Objective: Vital Signs: Blood pressure 148/83, pulse 81, temperature 98.2 F (36.8 C), temperature source Oral, resp. rate 18, height 5' 6"  (1.676 m), weight 73.12 kg (161 lb 3.2 oz), SpO2 98 %. No results found. Results for orders placed or performed during the hospital encounter of 05/16/16 (from the past 72 hour(s))  CBC WITH DIFFERENTIAL     Status: None   Collection Time: 05/17/16  5:39 AM  Result Value Ref Range   WBC 9.1 4.0 - 10.5 K/uL   RBC 4.45 4.22 - 5.81 MIL/uL   Hemoglobin 14.1 13.0 - 17.0 g/dL   HCT 41.1 39.0 - 52.0 %   MCV 92.4 78.0 - 100.0 fL   MCH 31.7 26.0 - 34.0 pg   MCHC 34.3 30.0 - 36.0 g/dL   RDW 12.5 11.5 - 15.5 %   Platelets 254 150 - 400 K/uL   Neutrophils Relative % 73 %   Neutro Abs 6.6 1.7 - 7.7 K/uL   Lymphocytes Relative 17 %   Lymphs Abs 1.6 0.7 - 4.0 K/uL   Monocytes Relative 8 %   Monocytes Absolute 0.7 0.1 - 1.0 K/uL   Eosinophils Relative 2 %   Eosinophils Absolute 0.2 0.0 - 0.7 K/uL   Basophils Relative 0 %   Basophils Absolute 0.0 0.0 - 0.1 K/uL  Comprehensive metabolic panel     Status: Abnormal   Collection Time: 05/17/16  5:39 AM  Result Value Ref Range   Sodium 136 135 - 145 mmol/L   Potassium 3.3 (L) 3.5 - 5.1 mmol/L   Chloride 103 101 - 111 mmol/L   CO2 25 22 - 32 mmol/L   Glucose, Bld 101 (H) 65 - 99 mg/dL   BUN 15 6 - 20 mg/dL   Creatinine, Ser 0.95 0.61 - 1.24 mg/dL   Calcium 8.5 (L) 8.9 - 10.3 mg/dL   Total Protein 6.4 (L) 6.5 - 8.1 g/dL   Albumin 3.3 (L) 3.5 - 5.0 g/dL   AST 40 15 - 41 U/L   ALT 29 17 - 63 U/L   Alkaline Phosphatase 60 38 - 126 U/L   Total Bilirubin 1.5 (H) 0.3 - 1.2 mg/dL   GFR calc non Af Amer >60 >60 mL/min   GFR calc Af Amer >60 >60 mL/min    Comment: (NOTE) The eGFR has been calculated using the CKD EPI equation. This calculation has not been validated in all clinical situations. eGFR's persistently <60 mL/min signify possible Chronic Kidney Disease.    Anion gap 8 5 -  15     HEENT: normal Cardio: RRR and no murmur Resp: CTA B/L and unlabored GI: BS positive and NT, ND Extremity:  No Edema Skin:   Bruise left forearm Neuro: Alert/Oriented, Abnormal Sensory absent LT, LUE and LLE, Abnormal Motor 3- LUE, 4- LLE, Abnormal FMC Ataxic/ dec FMC and Inattention Musc/Skel:  Normal Gen NAD   Assessment/Plan: 1. 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 Physiatrist is providing close team supervision and 24 hour management of active medical problems listed below. Physiatrist and rehab team continue to assess barriers to discharge/monitor patient progress toward functional and medical goals. FIM: Function - Bathing Position: Shower Body parts  bathed by patient: Left arm, Chest, Abdomen, Front perineal area, Right upper leg, Left upper leg Body parts bathed by helper: Back, Left lower leg, Right lower leg, Buttocks  Function- Upper Body Dressing/Undressing What is the patient wearing?: Pull over shirt/dress Pull over shirt/dress - Perfomed by patient: Put head through opening Pull over shirt/dress - Perfomed by helper: Pull shirt over trunk, Thread/unthread right sleeve, Thread/unthread left sleeve Function - Lower Body Dressing/Undressing What is the patient wearing?: Pants, Underwear, Non-skid slipper socks Position: Sitting EOB Underwear - Performed by patient: Thread/unthread right underwear leg Underwear - Performed by helper: Thread/unthread left underwear leg, Pull underwear up/down Pants- Performed by helper: Thread/unthread right pants leg, Thread/unthread left pants leg, Pull pants up/down Non-skid slipper socks- Performed by helper: Don/doff right sock, Don/doff left sock  Function - Toileting Toileting steps completed by patient: Adjust clothing prior to toileting, Adjust clothing after toileting, Performs perineal hygiene     Function - Chair/bed transfer Chair/bed transfer method: Stand pivot Chair/bed transfer assist level: Maximal assist (Pt 25 - 49%/lift and lower) Chair/bed transfer details: Verbal cues for sequencing, Verbal cues for technique, Verbal cues for precautions/safety, Manual facilitation for weight shifting, Manual facilitation for placement, Verbal cues for safe use of DME/AE, Tactile cues for posture, Tactile cues for placement  Function - Locomotion: Wheelchair Will patient use wheelchair at discharge?: Yes Type: Manual Max wheelchair distance: 153f Assist Level: Moderate assistance (Pt 50 - 74%) Assist Level: Moderate assistance (Pt 50 - 74%) Wheel 150 feet activity did not occur: Safety/medical concerns Turns around,maneuvers to table,bed, and toilet,negotiates 3% grade,maneuvers on rugs  and over doorsills: No Function - Locomotion: Ambulation Assistive device: No device Max distance: 3 ft Assist level: Maximal assist (Pt 25 - 49%) Walk 10 feet activity did not occur: Safety/medical concerns Walk 50 feet with 2 turns activity did not occur: Safety/medical concerns Walk 150 feet activity did not occur: Safety/medical concerns Walk 10 feet on uneven surfaces activity did not occur: Safety/medical concerns  Function - Comprehension Comprehension: Auditory Comprehension assist level: Follows complex conversation/direction with no assist  Function - Expression Expression: Verbal Expression assist level: Expresses complex ideas: With extra time/assistive device  Function - Social Interaction Social Interaction assist level: Interacts appropriately 90% of the time - Needs monitoring or encouragement for participation or interaction.  Function - Problem Solving Problem solving assist level: Solves basic 75 - 89% of the time/requires cueing 10 - 24% of the time  Function - Memory Memory assist level: Recognizes or recalls 90% of the time/requires cueing < 10% of the time Patient normally able to recall (first 3 days only): Current season, Location of own room, Staff names and faces, That he or she is in  a hospital   Medical Problem List and Plan: 1.  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  DVT Prophylaxis/Anticoagulation: SCDs. Monitor for any signs of DVT 3. Pain Management: Tylenol as needed 4. Hypertension. Permissive hypertension and monitor with increased mobility Filed Vitals:   05/17/16 0538 05/18/16 0456  BP: 162/89 148/83  Pulse: 54 81  Temp: 98.1 F (36.7 C) 98.2 F (36.8 C)  Resp: 16 18   5. Neuropsych: This patient is capable of making decisions on his own behalf. 6. Skin/Wound Care: Routine skin checks 7. Fluids/Electrolytes/Nutrition: Routine I&O's with follow-up  chemistries, 25% meals, add ensure 8. Hyperlipidemia. Lipitor 9. Tobacco abuse. Nicoderm patch. Provided counseling. 10. Hypokalemia: Cont to monitor and replete as necessary.will increase KCL supplementation 11. Hypophosphatemia: Cont to monitor and replete as necessary. 12.  Hypoalbuminemia start prostat   LOS (Days) 2 A FACE TO FACE EVALUATION WAS PERFORMED  Yale Golla E 05/18/2016, 7:09 AM

## 2016-05-18 NOTE — Progress Notes (Signed)
Occupational Therapy Session Note  Patient Details  Name: Johnathan [name] MRN: [id number] Date of Birth: 08-Jun-1951  Today's Date: 05/18/2016 OT Individual Time: 1300-1330 OT Individual Time Calculation (min): 30 min    Short Term Goals: Week 1:  OT Short Term Goal 1 (Week 1): Pt will complete UB bathing with supervision sitting unsupported. OT Short Term Goal 2 (Week 1): Pt will perform LB bathing sit to stand with min assist. OT Short Term Goal 3 (Week 1): Pt will perform stand pivot transfers to the shower with min assist. OT Short Term Goal 4 (Week 1): Pt will complete LB dressing with mod assist sit to stand.  OT Short Term Goal 5 (Week 1): Pt will use the LUE as a gross assist for selfcare tasks with supervision.    Skilled Therapeutic Interventions/Progress Updates:    1:1 Neuro reeducation with focus on postural control and alignment in static sitting on mat with visual (with mirror) and tactile cues for adjustments to find and maintain midline. Pt able to main postural adjustments but has difficulty maintaining midline with  dynamic movement. Continued progression of sit to stands with manual facilitation of left LE to ensure contact with foot and proper position. Able to progress from sit to stands in midline with min to mod A to subtle weight shifts with min to max A for control in prep for stand pivot transfers.  Also continued transfer training with lateral scoots down the EOM with min A with manual facilitation of LE for placement and contact with floor.   Therapy Documentation Precautions:  Precautions Precautions: Fall Precaution Comments: L sided neglect Restrictions Weight Bearing Restrictions: No Pain: Pain Assessment Pain Assessment: No/denies pain  See Function Navigator for Current Functional Status.   Therapy/Group: Individual Therapy  Willeen Cass [facility] 05/18/2016, 2:54 PM

## 2016-05-19 ENCOUNTER — Inpatient Hospital Stay (HOSPITAL_COMMUNITY): Payer: 59

## 2016-05-19 ENCOUNTER — Inpatient Hospital Stay (HOSPITAL_COMMUNITY): Payer: 59 | Admitting: Physical Therapy

## 2016-05-19 ENCOUNTER — Inpatient Hospital Stay (HOSPITAL_COMMUNITY): Payer: 59 | Admitting: Occupational Therapy

## 2016-05-19 NOTE — Progress Notes (Signed)
65 y.o. right handed male with history of tobacco abuse on no prescription medications. 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 They have a son also of the home who also works. One level home with 3 steps to entry. Presented 05/12/2016 with left-sided weakness. Initial CT scan showed no definite acute findings. MRI of the brain showed extensive acute subacute infarct involving the posterior right MCA territory. Additional involvement of the medial posterior right frontal lobe along the posterior cingulate gyrus. Remote infarct in the right occipital lobe. 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 Interventional radiology underwent right ICA stent 05/13/2016. Patient remained on ventilator for a short time. Echocardiogram with ejection fraction of 60% no wall motion abnormalities  Subjective/Complaints: No complaints, feels well  Objective: Vital Signs: Blood pressure 135/46, pulse 56, temperature 98.4 F (36.9 C), temperature source Oral, resp. rate 18, height 5\' 6"  (1.676 m), weight 161 lb 3.2 oz (73.12 kg), SpO2 99 %.  nad Chest cta cv- reg rate abd- soft Ext- no edema Neuro- alert   Assessment/Plan: 1. Functional deficits secondary to right posterior MCA infarct/occlusion right ICA status post stenting 05/13/2016   Medical Problem List and Plan: 1.  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  DVT Prophylaxis/Anticoagulation: SCDs. Monitor for any signs of DVT 3. Pain Management: Tylenol as needed 4. Hypertension. Permissive hypertension and monitor 132/73-150/77 5. Neuropsych: This patient is capable of making decisions on his own behalf. 6. Skin/Wound Care: Routine skin checks 7. Fluids/Electrolytes/Nutrition: Routine I&O's  Basic Metabolic Panel:     Component Value Date/Time   NA 136 05/17/2016 0539   K 3.3* 05/17/2016 0539   CL 103 05/17/2016 0539   CO2 25 05/17/2016 0539   BUN 15 05/17/2016 0539   CREATININE 0.95 05/17/2016 0539   GLUCOSE 101* 05/17/2016 0539   CALCIUM 8.5* 05/17/2016 0539    8. Hyperlipidemia. Lipitor 9. Tobacco abuse. Nicoderm patch. Provided counseling. 10. Hypokalemia: Cont to monitor and replete as necessary. 11. Hypophosphatemia: Cont to monitor and replete as necessary. Lab Results  Component Value Date   CALCIUM 8.5* 05/17/2016   PHOS 1.3* 05/15/2016    12.  Hypoalbuminemia on prostat   LOS (Days) 3 A FACE TO FACE EVALUATION WAS PERFORMED  Adalay Azucena H Alsha Meland 05/19/2016, 8:26 AM

## 2016-05-19 NOTE — Progress Notes (Signed)
Occupational Therapy Session Note  Patient Details  Name: Johnathan [name] MRN: [id number] Date of Birth: August 30, 1951  Today's Date: 05/19/2016 OT Individual Time:  -   1345-1415  (30 min)      Short Term Goals: Week 1:  OT Short Term Goal 1 (Week 1): Pt will complete UB bathing with supervision sitting unsupported. OT Short Term Goal 2 (Week 1): Pt will perform LB bathing sit to stand with min assist. OT Short Term Goal 3 (Week 1): Pt will perform stand pivot transfers to the shower with min assist. OT Short Term Goal 4 (Week 1): Pt will complete LB dressing with mod assist sit to stand.  OT Short Term Goal 5 (Week 1): Pt will use the LUE as a gross assist for selfcare tasks with supervision.   Week 2:     Skilled Therapeutic Interventions/Progress Updates:    Engaged in LUE NMR in sitting with emphasis on shoulder/scapular abduction, external rotation and movements without compensatory strategies.  Pt has good functional Left grasp, but decreased proximal control.  Provided instructions and exercises to add to pt's daily routine as follows:  Shoulder flexion with elbow and wrist extension and finger extension; Object passing from one table to another with crossing midline and trunk rotation.  Pt returned demonstration and set up routine in room.  Pt left  with all needs in reach.  Therapy Documentation Precautions:  Precautions Precautions: Fall Precaution Comments: L sided neglect, apraxia Restrictions Weight Bearing Restrictions: No    Vital Signs: Therapy Vitals Temp: 98.4 F (36.9 C) Temp Source: Oral Pulse Rate: 64 Resp: 18 BP: 126/84 mmHg Patient Position (if appropriate): Sitting Oxygen Therapy SpO2: 98 % O2 Device: Not Delivered Pain:  none         See Function Navigator for Current Functional Status.   Therapy/Group: Individual Therapy  Lisa Roca 05/19/2016, 5:35 PM

## 2016-05-19 NOTE — Progress Notes (Signed)
Physical Therapy Session Note  Patient Details  Name: Johnathan [name] MRN: [id number] Date of Birth: 01-16-1951  Today's Date: 05/19/2016 PT Individual Time: 0800-0900, 1300-1345 PT Individual Time Calculation (min): 60 min , 45 min  Short Term Goals: Week 1:  PT Short Term Goal 1 (Week 1): Patient will perform bed mobility with min A from PT  PT Short Term Goal 2 (Week 1): Patient will performed stand pivot transfer with mod A  PT Short Term Goal 3 (Week 1): Patient will ambulate for 25 ft with max A and LRAD  PT Short Term Goal 4 (Week 1): Patient will propel WC for 181ft with supervision A   Skilled Therapeutic Interventions/Progress Updates:   Pt demonstrates improvement initiating gait without UE support. Pt can perform L sided forced use activities but benefits from cues to [facility] recruit that side. Pt continues to do better with mirror for visual feed back. Pt would continue to benefit from skilled PT services to increase functional mobility.  Therapy Documentation Precautions:  Precautions Precautions: Fall Precaution Comments: L sided neglect, apraxia Restrictions Weight Bearing Restrictions: No Vital Signs: Therapy Vitals Temp: 98.4 F (36.9 C) Temp Source: Oral Pulse Rate: (!) 56 Resp: 18 BP: (!) 135/46 mmHg Patient Position (if appropriate): Lying Oxygen Therapy SpO2: 99 % O2 Device: Not Delivered Pain: Pain Assessment Pain Assessment: No/denies pain Pain Score: 0-No pain Mobility:  Mod A for sit to stand, Min A for segmental squat pivot with cues for weight shift, techniques, LE placement Locomotion :   Mod A 20' with cues for weight shift, posture, sequencing Other Treatments:  Tx 1: Pt performs dynamic sitting balance tasks with dual motor tasks within functional context including weight shifting, reaching, grasping, and LE manipulation tasks. Pt performs static standing with facilitation of upright posture and using mirror for visual feedback. BLE  advancement in standing. Pt performs heel raises 2x10. Transfer x15 in session. Pt performs sitting EOB as active rest. Pt educated on rehab plan, weight shift, safety in mobility, and progressing mobility.  Tx 2: Pt educated on rehab progression, forced use, and rehab progress. Pt performs toe taps at step, followed by LLE step-ups with BUE support with step to pattern and then alternating pattenr followed with LUE support step to only  2x10 each condition. Transfers x10 in session. Free-gait 10'x3 with small turns while holding green swiss ball Mod A. Jumping 2x10   See Function Navigator for Current Functional Status.   Therapy/Group: Individual Therapy  Monia Pouch 05/19/2016, 8:23 AM

## 2016-05-19 NOTE — Plan of Care (Signed)
Problem: RH SKIN INTEGRITY Goal: RH STG SKIN FREE OF INFECTION/BREAKDOWN Patients skin will remain free from further breakdown or infection with min assist.  Outcome: Not Progressing Skin tear 6/16 after fall

## 2016-05-19 NOTE — Progress Notes (Signed)
Occupational Therapy Session Note  Patient Details  Name: Johnathan [name] MRN: [id number] Date of Birth: 03/24/1951  Today's Date: 05/19/2016 OT Individual Time: 1000-1100 OT Individual Time Calculation (min): 60 min    Short Term Goals: Week 1:  OT Short Term Goal 1 (Week 1): Pt will complete UB bathing with supervision sitting unsupported. OT Short Term Goal 2 (Week 1): Pt will perform LB bathing sit to stand with min assist. OT Short Term Goal 3 (Week 1): Pt will perform stand pivot transfers to the shower with min assist. OT Short Term Goal 4 (Week 1): Pt will complete LB dressing with mod assist sit to stand.  OT Short Term Goal 5 (Week 1): Pt will use the LUE as a gross assist for selfcare tasks with supervision.    Skilled Therapeutic Interventions/Progress Updates:    OT session focused on LUE and trunk NMR, bed mobility, and functional transfers. Pt received sitting in w/c declining bathing and dressing this AM as he was already dressed. Transferred to mat table in therapy gym with min A and mod cues for sequencing and attention to L UE/LE for positioning. Engaged in [place] in supine with use of PNF D1 and D2 patterns to increase motor responses, proprioception, and overall functional use of UE. Pt required min-mod assist to control movements. Completed 4 sets x 5 reps of bridge exercise to increase core strength and control. Practiced supine<>sit with overall min A to achieve. OT provided verbal and tactile cues to prevent extension and increase core activation during bed mobility. At end of session pt returned to room and left with all needs in reach.  Therapy Documentation Precautions:  Precautions Precautions: Fall Precaution Comments: L sided neglect, apraxia Restrictions Weight Bearing Restrictions: No General:   Vital Signs: Therapy Vitals Temp: 97.9 F (36.6 C) Temp Source: Oral Pulse Rate: 61 Resp: 16 BP: 111/84 mmHg Patient Position (if appropriate):  Sitting Oxygen Therapy SpO2: 99 % O2 Device: Not Delivered Pain: Pain Assessment Pain Assessment: No/denies pain Pain Score: 0-No pain ADL:   Exercises:   Other Treatments:    See Function Navigator for Current Functional Status.   Therapy/Group: Individual Therapy  Maegan Buller, Quillian Quince 05/19/2016, 11:00 AM

## 2016-05-20 ENCOUNTER — Inpatient Hospital Stay (HOSPITAL_COMMUNITY): Payer: 59 | Admitting: Physical Therapy

## 2016-05-20 NOTE — Progress Notes (Signed)
Physical Therapy Session Note  Patient Details  Name: Johnathan [name] MRN: [id number] Date of Birth: Apr 20, 1951  Today's Date: 05/20/2016 PT Individual Time: 1300-1330 PT Individual Time Calculation (min): 30 min   Short Term Goals: Week 1:  PT Short Term Goal 1 (Week 1): Patient will perform bed mobility with min A from PT  PT Short Term Goal 2 (Week 1): Patient will performed stand pivot transfer with mod A  PT Short Term Goal 3 (Week 1): Patient will ambulate for 25 ft with max A and LRAD  PT Short Term Goal 4 (Week 1): Patient will propel WC for 135ft with supervision A   Skilled Therapeutic Interventions/Progress Updates:    Pt received seated in recliner, denies pain and agreeable to treatment. Stand pivot transfer recliner <>w/c with minA.  Gait 1x25' with R rail and min guard. Gait 2x25' and 1x50' with RW and minA. One trial x150' with RW and ace wrap dorsiflexion assist LLE, minA/min guard. Occasional L lateral lean and LLE scissoring, improved with cueing and repetition. Pt has most difficulty with sequencing, foot placement while turning to prepare to sit in chair; required mod/max verbal cues for LLE progression. Discussed with pt possibility of using AFO to improve foot clearance, reduce supination and reduce energy expenditure, however pt improving very quickly and may not need AFO at d/c.  Remained seated in recliner with quick release belt intact at completion of session, all needs in reach.   Therapy Documentation Precautions:  Precautions Precautions: Fall Precaution Comments: L sided neglect, apraxia Restrictions Weight Bearing Restrictions: No Pain: Pain Assessment Pain Assessment: No/denies pain   See Function Navigator for Current Functional Status.   Therapy/Group: Individual Therapy  Luberta Mutter 05/20/2016, 1:52 PM

## 2016-05-20 NOTE — Progress Notes (Signed)
65 y.o. right handed male with history of tobacco abuse on no prescription medications. 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 They have a son also of the home who also works. One level home with 3 steps to entry. Presented 05/12/2016 with left-sided weakness. Initial CT scan showed no definite acute findings. MRI of the brain showed extensive acute subacute infarct involving the posterior right MCA territory. Additional involvement of the medial posterior right frontal lobe along the posterior cingulate gyrus. Remote infarct in the right occipital lobe. 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 Interventional radiology underwent right ICA stent 05/13/2016. Patient remained on ventilator for a short time. Echocardiogram with ejection fraction of 60% no wall motion abnormalities  Subjective/Complaints: Patient feels well except he complains of not sleeping well last night. Otherwise no complaints.  Objective: Vital Signs: Blood pressure 163/75, pulse 56, temperature 97.9 F (36.6 C), temperature source Oral, resp. rate 18, height 5\' 6"  (1.676 m), weight 161 lb 3.2 oz (73.12 kg), SpO2 99 %.  nad-sitting up in chair eating. Chest cta cv- reg rate abd- soft Ext- no edema Neuro- alert   Assessment/Plan: 1. Functional deficits secondary to right posterior MCA infarct/occlusion right ICA status post stenting 05/13/2016   Medical Problem List and Plan: 1.  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  DVT Prophylaxis/Anticoagulation: SCDs. Monitor for any signs of DVT 3. Pain Management: Tylenol as needed 4. Hypertension. Permissive hypertension and monitor 111/84-163/75 5. Neuropsych: This patient is capable of making decisions on his own behalf. 6. Skin/Wound Care:  Routine skin checks 7. Fluids/Electrolytes/Nutrition: Routine I&O's  Basic Metabolic Panel:    Component Value Date/Time   NA 136 05/17/2016 0539   K 3.3* 05/17/2016 0539   CL 103 05/17/2016 0539   CO2 25 05/17/2016 0539   BUN 15 05/17/2016 0539   CREATININE 0.95 05/17/2016 0539   GLUCOSE 101* 05/17/2016 0539   CALCIUM 8.5* 05/17/2016 0539    8. Hyperlipidemia. Lipitor 9. Tobacco abuse. Nicoderm patch. Provided counseling. 10. Hypokalemia: Cont to monitor and replete as necessary. 11. Hypophosphatemia: Cont to monitor and replete as necessary. Lab Results  Component Value Date   CALCIUM 8.5* 05/17/2016   PHOS 1.3* 05/15/2016    12.  Hypoalbuminemia on prostat I have encouraged him to take trazodone at night to see if that helps with his insomnia.  LOS (Days) 4 A FACE TO FACE EVALUATION WAS PERFORMED  Eastyn Skalla H Shannan Slinker 05/20/2016, 8:19 AM

## 2016-05-21 ENCOUNTER — Inpatient Hospital Stay (HOSPITAL_COMMUNITY): Payer: 59

## 2016-05-21 ENCOUNTER — Inpatient Hospital Stay (HOSPITAL_COMMUNITY): Payer: 59 | Admitting: Occupational Therapy

## 2016-05-21 ENCOUNTER — Encounter (HOSPITAL_COMMUNITY): Payer: 59

## 2016-05-21 NOTE — Progress Notes (Addendum)
Physical Therapy Note  Patient Details  Name: Johnathan [name] MRN: [id number] Date of Birth: 12-23-1950 Today's Date: 05/21/2016  1030-1140, 70 min individual tx Pain: none reported  PT discussed pt's home entry: 2 steps without any rail or support.  PT requested his wife take pics and email to CSW.    neuromuscular re-education via forced use, visual , mirror and tactile cues for L LE joint awareness and alternating reciprocal movements x 4 extremties in sitting on NuStep at level 4 x 10 minutes, in sitting on wedge for pelvic alignment focusing on midline orientation, in unsupported sitting for reciprocal scooting for pelvic dissociation.  Therapeutic activity in supported sitting to facilitate R wt shifting, R trunk elongation/L trunk shortening, and R rotating during clothes pin activity reaching out of BOS with R hand manipulation of pins, and within BOS during L hand manipulation of pins.  Pt demonstrated excessive L lean in sitting at all times unless cued.  W/c footrests switched and apple wood seat insert added for improved pelvic positioning and comfort. Pt demonstrated better pelvic positioning with changes.    Gait with RW x 25' including 2 R turns on level tile with mod assist for wt shifting, balance, cadence.  Pt has difficulty with turns due to over-shifting L.  Pt left resting in recliner with quick release belt donned.  PT assigned pt alternating toe taps/heel taps in sitting for coordination ex.   Corinna Burkman 05/21/2016, 12:24 PM

## 2016-05-21 NOTE — Progress Notes (Signed)
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 rate 18, height 5\' 6"  (1.676 m), weight 75.116 kg (165 lb 9.6 oz), SpO2 100 %. No results found. No results found for this or any previous visit (from the past 72 hour(s)).   HEENT: normal Cardio: RRR and no murmur Resp: CTA B/L and unlabored GI: BS positive and NT, ND Extremity:  No Edema Skin:   Bruise left forearm Neuro: Alert/Oriented, Abnormal Sensory absent LT, LUE and LLE, Abnormal Motor 3- LUE, 4- LLE, Abnormal FMC Ataxic/ dec FMC and Inattention Musc/Skel:  Normal Gen NAD   Assessment/Plan: 1. 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 Physiatrist is providing close team supervision and 24 hour management of active medical problems listed below. Physiatrist and rehab team continue to assess barriers to discharge/monitor patient progress toward functional and medical goals. FIM: Function - Bathing Position: Shower Body parts bathed by patient: Left arm, Chest, Abdomen, Front perineal area, Right upper leg, Left upper leg, Right lower leg, Left lower leg Body parts bathed by helper: Buttocks, Back, Right arm Assist Level: Touching or steadying assistance(Pt > 75%)  Function- Upper Body Dressing/Undressing What is the patient wearing?: Pull over shirt/dress Pull over shirt/dress - Perfomed by patient: Put head through opening Pull over shirt/dress - Perfomed by helper: Thread/unthread left sleeve, Pull shirt over trunk, Thread/unthread right sleeve, Put head through opening Assist Level: Touching or steadying assistance(Pt >  75%) Function - Lower Body Dressing/Undressing What is the patient wearing?: Underwear, Pants, Shoes Position: Wheelchair/chair at sink Underwear - Performed by patient: Thread/unthread right underwear leg Underwear - Performed by helper: Pull underwear up/down, Thread/unthread left underwear leg, Thread/unthread right underwear leg Pants- Performed by helper: Thread/unthread left pants leg, Pull pants up/down, Thread/unthread right pants leg Non-skid slipper socks- Performed by helper: Don/doff right sock, Don/doff left sock Shoes - Performed by helper: Don/doff right shoe, Don/doff left shoe Assist for lower body dressing: Touching or steadying assistance (Pt > 75%)  Function - Toileting Toileting steps completed by patient: Performs perineal hygiene, Adjust clothing after toileting, Adjust clothing prior to toileting     Function - Chair/bed transfer Chair/bed transfer method: Stand pivot Chair/bed transfer assist level: Touching or steadying assistance (Pt > 75%) Chair/bed transfer assistive device: Armrests Chair/bed transfer details: Manual facilitation for placement, Tactile cues for weight shifting, Tactile cues for weight bearing, Verbal cues for technique, Verbal cues for sequencing, Verbal cues for precautions/safety  Function - Locomotion: Wheelchair Will patient use wheelchair at discharge?: Yes Type: Manual Max wheelchair distance: 150 Assist Level: Touching or steadying assistance (Pt > 75%) Assist Level: Touching or steadying assistance (Pt > 75%) Wheel 150 feet activity did not occur: Safety/medical concerns Assist Level: Touching or steadying assistance (Pt > 75%) Turns around,maneuvers to table,bed, and toilet,negotiates 3% grade,maneuvers on rugs and over doorsills: No Function - Locomotion: Ambulation Assistive device: Walker-rolling Max distance: 150 Assist level: Touching or steadying assistance (Pt > 75%) Walk 10 feet activity did not occur: Safety/medical  concerns Assist level: Touching or steadying assistance (Pt > 75%) Walk 50 feet with 2 turns activity did not occur: Safety/medical concerns Assist level: Touching or steadying assistance (Pt > 75%) Walk 150 feet  activity did not occur: Safety/medical concerns Assist level: Touching or steadying assistance (Pt > 75%) Walk 10 feet on uneven surfaces activity did not occur: Safety/medical concerns  Function - Comprehension Comprehension: Auditory Comprehension assist level: Follows complex conversation/direction with no assist  Function - Expression Expression: Verbal Expression assist level: Expresses complex ideas: With extra time/assistive device  Function - Social Interaction Social Interaction assist level: Interacts appropriately 90% of the time - Needs monitoring or encouragement for participation or interaction.  Function - Problem Solving Problem solving assist level: Solves basic 75 - 89% of the time/requires cueing 10 - 24% of the time  Function - Memory Memory assist level: Recognizes or recalls 90% of the time/requires cueing < 10% of the time Patient normally able to recall (first 3 days only): That he or she is in a hospital   Medical Problem List and Plan: 1.  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  DVT Prophylaxis/Anticoagulation: SCDs. Monitor for any signs of DVT 3. Pain Management: Tylenol as needed 4. Hypertension. Permissive hypertension and monitor with increased mobility Filed Vitals:   05/20/16 1431 05/21/16 0418  BP: 146/85 134/65  Pulse: 69 52  Temp: 97.8 F (36.6 C) 98.6 F (37 C)  Resp: 18 18   5. Neuropsych: This patient is capable of making decisions on his own behalf. 6. Skin/Wound Care: Routine skin checks 7. Fluids/Electrolytes/Nutrition: Routine I&O's with follow-up chemistries, 75-100% meals,improved,  8. Hyperlipidemia. Lipitor 9. Tobacco  abuse. Nicoderm patch. Provided counseling. 10. Hypokalemia: Cont to monitor and replete as necessary.will increase KCL supplementation 11. Hypophosphatemia: Cont to monitor and replete as necessary. 12.  Hypoalbuminemia start prostat,check pre alb 13. Insomnia improved with trazodone  LOS (Days) 5 A FACE TO FACE EVALUATION WAS PERFORMED  Gilberto Stanforth E 05/21/2016, 7:32 AM

## 2016-05-21 NOTE — Progress Notes (Signed)
Occupational Therapy Session Note  Patient Details  Name: Johnathan [name] MRN: [id number] Date of Birth: 02/14/1951  Today's Date: 05/21/2016 OT Individual Time: 0800-0900 OT Individual Time Calculation (min): 60 min    Short Term Goals: Week 1:  OT Short Term Goal 1 (Week 1): Pt will complete UB bathing with supervision sitting unsupported. OT Short Term Goal 2 (Week 1): Pt will perform LB bathing sit to stand with min assist. OT Short Term Goal 3 (Week 1): Pt will perform stand pivot transfers to the shower with min assist. OT Short Term Goal 4 (Week 1): Pt will complete LB dressing with mod assist sit to stand.  OT Short Term Goal 5 (Week 1): Pt will use the LUE as a gross assist for selfcare tasks with supervision.    Skilled Therapeutic Interventions/Progress Updates:    Pt completed shower and dressing during session.  Mod assist with mod demonstrational cueing for ambulation to the shower with use of the RW.  He tends to exhibit increased lean to the left with the LLE, not taking full step forward as well as demonstrating LLE adduction.  Min assist for using the LUE to wash under the right arm.  Min assist for sit to stand at the sink and for sit to stand in the shower, with use of the grab bar.  Mod instructional cueing for hemi dressing techniques with pt attempting to donn his RLE in the shorts before fully placing the left in the pants.  Min assist to pull pants and underpants over the hips.  Pt using the LUE spontaneously with selfcare tasks throughout session but still with motor planning noted.  Pt left in wheelchair at end of session with call button and phone in reach.      Therapy Documentation Precautions:  Precautions Precautions: Fall Precaution Comments: L sided neglect, apraxia Restrictions Weight Bearing Restrictions: No  Pain: Pain Assessment Pain Assessment: No/denies pain ADL:  See Function Navigator for Current Functional Status.   Therapy/Group:  Individual Therapy  Marlo Arriola OTR/L 05/21/2016, 12:13 PM

## 2016-05-21 NOTE — Progress Notes (Signed)
Occupational Therapy Session Note  Patient Details  Name: TRENCE PATAO MRN: [id number] Date of Birth: 09-Dec-1950  Today's Date: 05/21/2016 OT Individual Time: 1300-1434 OT Individual Time Calculation (min): 94 min    Skilled Therapeutic Interventions/Progress Updates:    Pt worked on Brewing technologist in the therapy gym during session.  Mod assist for stand pivot transfers and short distance mobility with hand held assist.  Pt with scissoring of the LLE with stepping at times.  Worked in quadriped on LUE weightbearing and trunk alignment.  Pt needing max assist to maintain left elbow extension in weightbearing secondary to motor planning deficits.  Noted increased lean to the left with LOB when attempting to reach with the RUE while LUE was in weightbearing.  Transitioned to sitting for continued work on LUE and left trunk activation.  Pt maintains left trunk elongation and right side shortening at rest.  In addition, he sits in a posterior pelvic tilt with increased posterior lean and increased cervical flexion as well.  He tends to activate the right trunk for transitions from sidelying on the left side to sitting.  Worked on left trunk activation and shortening as well as left elbow extension to assist with sidelying to sit instead of using the right side.  Progressed to functional reach with the LUE in sitting.  Pt still using synergy pattern at the shoulder with decreased full elbow extension as well.  Focused on achieving full elbow extension for functional reach while avoiding head tilt to the right and shoulder hiking.  Pt needing min assist for support at the elbow and mod assist at the trunk to avoid this compensation technique while working on removing clothespins from the vertical bar.  Issued foam pieces for pt to work on picking up at end of session.  Pt left in bedside recliner with safety belt in place and call button in reach.      Therapy Documentation Precautions:   Precautions Precautions: Fall Precaution Comments: L sided neglect, apraxia Restrictions Weight Bearing Restrictions: No  Pain: Pain Assessment Pain Assessment: No/denies pain ADL: See Function Navigator for Current Functional Status.   Therapy/Group: Individual Therapy  [place] Espina OTR/L 05/21/2016, 3:14 PM

## 2016-05-22 ENCOUNTER — Inpatient Hospital Stay (HOSPITAL_COMMUNITY): Payer: 59 | Admitting: Occupational Therapy

## 2016-05-22 ENCOUNTER — Inpatient Hospital Stay (HOSPITAL_COMMUNITY): Payer: 59 | Admitting: Physical Therapy

## 2016-05-22 LAB — PREALBUMIN: Prealbumin: 22.6 mg/dL (ref 18–38)

## 2016-05-22 MED ORDER — ALPRAZOLAM 0.25 MG PO TABS
0.2500 mg | ORAL_TABLET | Freq: Four times a day (QID) | ORAL | Status: DC | PRN
  Administered 2016-05-22 – 2016-05-31 (×9): 0.25 mg via ORAL
  Filled 2016-05-22 (×7): qty 1

## 2016-05-22 NOTE — Progress Notes (Signed)
Occupational Therapy Session Note  Patient Details  Name: Johnathan [name] MRN: [id number] Date of Birth: 1951-08-14  Today's Date: 05/22/2016 OT Individual Time: 0300-0358 OT Individual Time Calculation (min): 58 min    Short Term Goals: Week 1:  OT Short Term Goal 1 (Week 1): Pt will complete UB bathing with supervision sitting unsupported. OT Short Term Goal 2 (Week 1): Pt will perform LB bathing sit to stand with min assist. OT Short Term Goal 3 (Week 1): Pt will perform stand pivot transfers to the shower with min assist. OT Short Term Goal 4 (Week 1): Pt will complete LB dressing with mod assist sit to stand.  OT Short Term Goal 5 (Week 1): Pt will use the LUE as a gross assist for selfcare tasks with supervision.    Skilled Therapeutic Interventions/Progress Updates:   Upon entering the room, pt seated in wheelchair awaiting therapist with no c/o pain this session. Pt propelled wheelchair 200' with R hemiplegic  technique with supervision to new room location. Pt engaged in [facility] tasks with use of L UE for finger isolation, stacking, and palmar translation tasks with small manipulatives. Pt able to stack 6 items on top of each other but unable to stack any items with palmar translation tasks secondary to decreased control once getting items into finger tips. Pt performed sit <>stand with min A from wheelchair. Pt ambulating 10' with RW and mod A for cues to advance L LE. Pt standing at sink with steady assist and reaching for grooming items to brush beard with L hand. Pt returning to wheelchair with quick release belt donned and call bell within reach upon exiting the room.   Therapy Documentation Precautions:  Precautions Precautions: Fall Precaution Comments: L sided neglect, apraxia Restrictions Weight Bearing Restrictions: No Vital Signs: Therapy Vitals Temp: 98.1 F (36.7 C) Temp Source: Oral Pulse Rate: 69 Resp: 18 BP: (!) 149/87 mmHg Patient Position (if appropriate):  Sitting Oxygen Therapy SpO2: 100 % O2 Device: Not Delivered  See Function Navigator for Current Functional Status.   Therapy/Group: Individual Therapy  Phineas Semen 05/22/2016, 4:29 PM

## 2016-05-22 NOTE — Progress Notes (Signed)
Physical Therapy Session Note  Patient Details  Name: Johnathan [name] MRN: [id number] Date of Birth: [birth year]  Today's Date: 05/22/2016 PT Individual Time: 1000-1100 PT Individual Time Calculation (min): 60 min   Short Term Goals: Week 1:  PT Short Term Goal 1 (Week 1): Patient will perform bed mobility with min A from PT  PT Short Term Goal 2 (Week 1): Patient will performed stand pivot transfer with mod A  PT Short Term Goal 3 (Week 1): Patient will ambulate for 25 ft with max A and LRAD  PT Short Term Goal 4 (Week 1): Patient will propel WC for 171ft with supervision A   Skilled Therapeutic Interventions/Progress Updates:   Pt received in w/c; continued discussion with pt regarding ELOS, goals, progress and home set up and modifications needed.  Pt reports his wife will be having a ramp or rails installed for home entry/exit due to her having knee issues.  Pt anxious to make fast progress and D/C home.  Pt agreeable to balance assessment and stair negotiation training in gym.  Reviewed R hemi technique and performed w/c mobility x 150' with supervision and verbal cues for sequencing.  Performed BERG balance assessment; see results below.  Reviewed falls risk with pt, safety concerns and recommendations.  Performed NMR; see below for details.  Pt returned to room and left in w/c with all items within reach.  Therapy Documentation Precautions:  Precautions Precautions: Fall Precaution Comments: L sided neglect, apraxia Restrictions Weight Bearing Restrictions: No Pain:  No c/o pain Balance: Standardized Balance Assessment Standardized Balance Assessment: Berg Balance Test Berg Balance Test Sit to Stand: Needs minimal aid to stand or to stabilize Standing Unsupported: Able to stand 30 seconds unsupported Sitting with Back Unsupported but Feet Supported on Floor or Stool: Able to sit safely and securely 2 minutes Stand to Sit: Controls descent by using hands Transfers: Needs one  person to assist Standing Unsupported with Eyes Closed: Able to stand 3 seconds Standing Ubsupported with Feet Together: Needs help to attain position but able to stand for 30 seconds with feet together From Standing, Reach Forward with Outstretched Arm: Can reach confidently >25 cm (10") From Standing Position, Pick up Object from Floor: Able to pick up shoe, needs supervision From Standing Position, Turn to Look Behind Over each Shoulder: Looks behind one side only/other side shows less weight shift Turn 360 Degrees: Needs assistance while turning Standing Unsupported, Alternately Place Feet on Step/Stool: Needs assistance to keep from falling or unable to try Standing Unsupported, One Foot in Front: Needs help to step but can hold 15 seconds Standing on One Leg: Able to lift leg independently and hold equal to or more than 3 seconds Total Score: 27  Patient demonstrates increased fall risk as noted by score of 27/56 on Berg Balance Scale.  (<36= high risk for falls, close to 100%; 37-45 significant >80%; 46-51 moderate >50%; 52-55 lower >25%) Other Treatments: Treatments Neuromuscular Facilitation: Left;Upper Extremity;Lower Extremity;Activity to increase coordination;Activity to increase motor control;Activity to increase timing and sequencing;Activity to increase sustained activation;Activity to increase lateral weight shifting;Activity to increase anterior-posterior weight shifting during gait x 75' + 150' with LUE support only as well as during negotiation up/down 4 stairs x 2 reps with bilat UE support on rails with step over step sequencing with focus on lateral and anterior weight shifting, trunk elongation on L side, activation of LLE extension in stance and full LLE clearance/advancement during swing phase with mod A overall.  See Function Navigator for Current Functional Status.   Therapy/Group: Individual Therapy  Raylene Everts Faucette 05/22/2016, 12:20 PM

## 2016-05-22 NOTE — Progress Notes (Signed)
Occupational Therapy Session Note  Patient Details  Name: BARIN SHIRAH MRN: [id number] Date of Birth: 08-28-1951  Today's Date: 05/22/2016 OT Individual Time:[id number] and [id number] OT Individual Time Calculation (min): 30 min and 60 min Today's Date: 05/22/2016    Short Term Goals:    Skilled Therapeutic Interventions/Progress Updates:    Pt participated in self care mgt of ADL retraining in shower and dressing prn standing w/c level at sink. Pt is improving with maintaining dynamic sitting balance on shower bench though exhibits strong L lean when lifting and crossing LEs due to L sided weakness. Pt continues to present with difficultly straightening L LE and decreased proprioceptive awareness for L side during self care completion. Left visual field scanning is improving and scanning promoted during gathering ADL items and reaching for ADL items on L side during dressing. While completing dressing standing sinkside, pt required max vcs for orientation of UB/LB garments. Pt ed provided on use of hemi techniques. Visual biofeedback utilized to increase postural awareness and promote proper postural alignment while standing to hike pants. Pt able to complete hiking of pants while standing with symmetrical posture and no loss of balance with Mod A for balance and stabilizing L knee. Pt left in w/c with QRB and all needs within reach. Pt benefits from strengthening of trunk muscles of L side, improving gross motor coordination, L proprioceptive awareness, and visual perceptual skill retraining to improve orientation abilities during dressing.  2nd Session 1:1 1302-1332 Pt seen in afternoon tx session focusing on neuro re ed to improve posture and trunk alignment during self care completion. Pt completed tx at [facility] and Mod A for maintaining anterior pelvic tilt with visual biofeedback provided to increase postural awareness. Pt completed right sided lateral reaching task to strengthen L trunk muscles and  weight bear through L UE.  Pt exhibited left shoulder hike while integrating L UE into task and required manual assistance to facilitate typical movement patterns with L UE. When completing rotational trunk movements, pt exhibits posterior pelvic tilt and requires additional cuing for maintaining upright posture. Lateral scooting completed with assistance for knee stabilization and instruction for anterior leaning to facilitate typical movement patterns during self care and functional transfers. At rest, pt presents with L leaning posture due to weakness still. Pt continues to benefit from remediation of sitting and standing posture to prevent development of compensatory movements and relearn typical movement patterns for self care completion.      Therapy Documentation Precautions:  Precautions Precautions: Fall Precaution Comments: L sided neglect, apraxia Restrictions Weight Bearing Restrictions: No     Pain: No complaints of pain today       Other Treatments: Treatments Neuromuscular Facilitation: Left;Upper Extremity;Lower Extremity;Activity to increase coordination;Activity to increase motor control;Activity to increase timing and sequencing;Activity to increase sustained activation;Activity to increase lateral weight shifting;Activity to increase anterior-posterior weight shifting  See Function Navigator for Current Functional Status.   Therapy/Group: Individual Therapy  Hawke Villalpando A Savonna Birchmeier 05/22/2016, 1:56 PM

## 2016-05-22 NOTE — Progress Notes (Signed)
Subjective/Complaints: Has a knot on Left chest area after fall at time of CVA  Review of Systems - Negative except insomnia, denies CP/SOB/ bowel or bladder issues  Objective: Vital Signs: Blood pressure 124/54, pulse 54, temperature 98.3 F (36.8 C), temperature source Oral, resp. rate 18, height 5\' 6"  (1.676 m), weight 76.8 kg (169 lb 5 oz), SpO2 99 %. No results found. No results found for this or any previous visit (from the past 72 hour(s)).   HEENT: normal Cardio: RRR and no murmur Resp: CTA B/L and unlabored GI: BS positive and NT, ND Extremity:  No Edema Skin:   Bruise left forearm, left pectoral with small firm 1cm mass in area of bruising Neuro: Alert/Oriented, Abnormal Sensory absent LT, LUE and LLE, Abnormal Motor 3- LUE, 4- LLE, Abnormal FMC Ataxic/ dec FMC and Inattention Musc/Skel:  Normal Gen NAD   Assessment/Plan: 1. 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 Physiatrist is providing close team supervision and 24 hour management of active medical problems listed below. Physiatrist and rehab team continue to assess barriers to discharge/monitor patient progress toward functional and medical goals. FIM: Function - Bathing Position: Shower Body parts bathed by patient: Left arm, Chest, Abdomen, Front perineal area, Right upper leg, Left upper leg, Right lower leg, Left lower leg, Right arm, Buttocks Body parts bathed by helper: Back Assist Level: Touching or steadying assistance(Pt > 75%)  Function- Upper Body Dressing/Undressing What is the patient wearing?: Pull over shirt/dress Pull over shirt/dress - Perfomed by patient: Put head through opening, Thread/unthread right sleeve, Pull shirt over trunk Pull over shirt/dress - Perfomed by helper: Thread/unthread left sleeve Assist Level:   (OT did all tasks, total assist) Function - Lower Body Dressing/Undressing What is the patient wearing?: Underwear, Pants, Shoes Position: Wheelchair/chair at sink Underwear - Performed by patient: Thread/unthread right underwear leg, Thread/unthread left underwear leg, Pull underwear up/down Underwear - Performed by helper: Pull underwear up/down, Thread/unthread left underwear leg, Thread/unthread right underwear leg Pants- Performed by patient: Thread/unthread right pants leg, Thread/unthread left pants leg, Pull pants up/down Pants- Performed by helper: Thread/unthread left pants leg, Pull pants up/down, Thread/unthread right pants leg Non-skid slipper socks- Performed by patient: Don/doff right sock, Don/doff left sock Non-skid slipper socks- Performed by helper: Don/doff right sock, Don/doff left sock Shoes - Performed by helper: Don/doff left shoe Assist for footwear: Dependant Assist for lower body dressing: Touching or steadying assistance (Pt > 75%)  Function - Toileting Toileting steps completed by patient: Performs perineal hygiene, Adjust clothing after toileting, Adjust clothing prior to toileting Toileting steps completed by helper: Adjust clothing prior to toileting, Performs perineal hygiene, Adjust clothing after toileting (per Elby Beck, NT) Assist level: Two helpers (per [employer], NT)  Function - Toilet Transfers Assist level to toilet: 2 helpers (per Elby Beck, NT)  Function - Chair/bed transfer Chair/bed transfer method: Squat pivot Chair/bed transfer assist level: Moderate assist (Pt 50 - 74%/lift or lower) Chair/bed transfer assistive device: Armrests Chair/bed transfer details: Manual facilitation for placement, Tactile cues for weight shifting, Tactile cues for weight bearing, Verbal cues for technique, Verbal cues for sequencing, Verbal cues for precautions/safety  Function - Locomotion: Wheelchair Will patient use wheelchair at discharge?: Yes Type:  Manual Max wheelchair distance: 150 Assist Level: Touching or steadying assistance (Pt > 75%) Assist Level: Touching or steadying assistance (Pt > 75%) Wheel 150  feet activity did not occur: Safety/medical concerns Assist Level: Touching or steadying assistance (Pt > 75%) Turns around,maneuvers to table,bed, and toilet,negotiates 3% grade,maneuvers on rugs and over doorsills: No Function - Locomotion: Ambulation Assistive device: Walker-rolling Max distance: 25 Assist level: Moderate assist (Pt 50 - 74%) Walk 10 feet activity did not occur: Safety/medical concerns Assist level: Moderate assist (Pt 50 - 74%) Walk 50 feet with 2 turns activity did not occur: Safety/medical concerns Assist level: Touching or steadying assistance (Pt > 75%) Walk 150 feet activity did not occur: Safety/medical concerns Assist level: Touching or steadying assistance (Pt > 75%) Walk 10 feet on uneven surfaces activity did not occur: Safety/medical concerns  Function - Comprehension Comprehension: Auditory Comprehension assist level: Follows complex conversation/direction with no assist  Function - Expression Expression: Verbal Expression assist level: Expresses complex ideas: With extra time/assistive device  Function - Social Interaction Social Interaction assist level: Interacts appropriately 90% of the time - Needs monitoring or encouragement for participation or interaction.  Function - Problem Solving Problem solving assist level: Solves basic 75 - 89% of the time/requires cueing 10 - 24% of the time  Function - Memory Memory assist level: Recognizes or recalls 90% of the time/requires cueing < 10% of the time Patient normally able to recall (first 3 days only): That he or she is in a hospital   Medical Problem List and Plan: 1.  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  DVT Prophylaxis/Anticoagulation: SCDs.was not  getting consistently  Monitor for any signs of DVT 3. Pain Management: Tylenol as needed 4. Hypertension. Permissive hypertension and monitor with increased mobility Filed Vitals:   05/21/16 1530 05/22/16 0536  BP: 148/93 124/54  Pulse: 72 54  Temp: 97.8 F (36.6 C) 98.3 F (36.8 C)  Resp: 18 18   5. Neuropsych: This patient is capable of making decisions on his own behalf. 6. Skin/Wound Care: Routine skin checks, suspect clacified hematoma 7. Fluids/Electrolytes/Nutrition: Routine I&O's with follow-up chemistries, 75-100% meals,improved,  8. Hyperlipidemia. Lipitor 9. Tobacco abuse. Nicoderm patch. Provided counseling. 10. Hypokalemia: Cont to monitor and replete as necessary.will increase KCL supplementation 11. Hypophosphatemia: Cont to monitor and replete as necessary. 12.  Hypoalbuminemia start prostat,check pre alb today 13. Insomnia improved with trazodone  LOS (Days) 6 A FACE TO FACE EVALUATION WAS PERFORMED  Charlisa Cham E 05/22/2016, 7:21 AM

## 2016-05-23 ENCOUNTER — Inpatient Hospital Stay (HOSPITAL_COMMUNITY): Payer: 59 | Admitting: Occupational Therapy

## 2016-05-23 ENCOUNTER — Inpatient Hospital Stay (HOSPITAL_COMMUNITY): Payer: 59

## 2016-05-23 NOTE — Progress Notes (Signed)
Patient slept for approximately 3-4 hours during shift after administration of prn trazodone and prn xanax per PA-C. Patient states xanax did help him relax. Still feels very tired and like he "really needs more sleep." Both patient and wife had expressed concern for patient's insomnia prior to order being placed for prn xanax. Patient states he had only been sleeping "about an hour" a night.

## 2016-05-23 NOTE — Progress Notes (Signed)
Social Work Patient ID: Johnathan [name], male   DOB: 1951-07-20, 65 y.o.   MRN: [id number] Met with pt and wife who was here to hear about team conference update. Goals set for supervision/min assist level and target discharge date 6/30. Pt was hoping he could go home sooner, but understands and Will do what the team wants. Wife feels he has made good progress since stroke and can see a huge difference. She will come in next week to attend therapies with him and learn his care. Discussed at least having a rail Placed on their two steps. Both agreeable to the plans, will work toward discharge next week.

## 2016-05-23 NOTE — Consult Note (Signed)
   [facility] CM Inpatient Consult   05/23/2016  Johnathan [name] 05/16/1951 [id number]   Came by to visit with [name] on behalf of [facility] Management program. Reports the plan is to stay another week. Expresses that he was a little disappointed but understands that he needs to stay a little longer. Reminded him that he will receive post hospital discharge calls from [place] with [facility] Management program. Also discussed that he will now have Primary Care MD follow up with Dr. Moreen Fowler. Appreciative of the inpatient rehab Licensed CSW assistance with this. Spoke with LCSW who indicates discharge plan is now set for 06/01/16. Will continue to follow along.   Marthenia Rolling, MSN-Ed, RN,BSN [facility] Liaison [phone]

## 2016-05-23 NOTE — Patient Care Conference (Signed)
Inpatient RehabilitationTeam Conference and Plan of Care Update Date: 05/23/2016   Time: 10:45 AM    Patient Name: Johnathan [name]      Medical Record Number: [id number]  Date of Birth: [birth year] Sex: Male         Room/Bed: 4M02C/4M02C-01 Payor Info: Payor: [employer] EMPLOYEE / Plan:  [employer] / Product Type: *No Product type* /    Admitting Diagnosis: [place]rokee  Admit Date/Time:  05/16/2016  4:09 PM Admission Comments: No comment available   Primary Diagnosis:  Cerebrovascular accident (CVA) due to thrombosis of right carotid artery ([place]) Principal Problem: Cerebrovascular accident (CVA) due to thrombosis of right carotid artery [facility])  Patient Active Problem List   Diagnosis Date Noted  . Left-sided weakness 05/16/2016  . Right Carotid artery occlusion with infarction ([place]) 05/16/2016  . Essential hypertension 05/16/2016  . Hyperlipidemia LDL goal <70 05/16/2016  . Cigarette smoker 05/16/2016  . Overweight (BMI 25.0-29.9) 05/16/2016  . 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 Acute respiratory failure ([place]) 05/16/2016  . Cerebral infarction involving right middle cerebral artery ([place]) 05/16/2016  . Gait disturbance, post-stroke   . Hypokalemia   . Hypophosphatemia   . Cerebrovascular accident (CVA) due to thrombosis of right carotid artery [facility]) s/p IV tPA, R ICA stent placement, thrombectomy     Expected Discharge Date: Expected Discharge Date: 06/01/16  Team Members Present: Physician leading conference: Dr. Alysia Penna Social Worker Present: Ovidio Kin, LCSW Nurse Present: Dorien Chihuahua, RN PT Present: Georjean Mode, PT OT Present: Clyda Greener, OT SLP Present: Windell Moulding, SLP PPS Coordinator present : Daiva Nakayama, RN, CRRN     Current Status/Progress Goal Weekly Team Focus  Medical             Bowel/Bladder   Continent of bowel and bladder, Last BM 6/18  Remain continent of bowel and bladder, Prevent constipation  Assess and treat for  constipation per protocol   Swallow/Nutrition/ Hydration             ADL's   supervision for UB bathing with mod assist for UB dressing,  LB bathing min assist with LB dressing at min, Min to mod assist for stand pivot transfers with min assist for squat pivot transfers.  Still with decreased LUE functional coordination but uses at a diminshed level for selfcare tasks with increased time.  Limited more proximally with shoulder flexion than distally.  supervision overall  selfcare re-training, transfer re-training, balance re-training, neuromuscular re-education, pt/family education   Mobility   max cues for bed mobility, min-mod assist transfers, S w/c prop x 150', mod gait x 150 hand hold assist or grocery cart  S basic transfers, min assist gait x 150' and up/down ramp; modifiied independent w/c x 150'  neuro re-ed, awareness, balance, midline orientation, mobility, gait, pt ed, equipment   Communication             Safety/Cognition/ Behavioral Observations            Pain   No complaints of pain  Pain level of 4 or less  Assessment and management of pain q4hrs and prn   Skin   Skin intact  Skin integrity to remain free from alteration  Encourage skin inspection and weight shifts to prevent skin breakdown      *See Care Plan and progress notes for long and short-term goals.  Barriers to Discharge:      Possible Resolutions to Barriers:       Discharge  Planning/Teaching Needs:  Home with wife and son to assist-wife works days. Aware will need 24 hr supeprvision at discharge.      Team Discussion:  Goals supervision/min assist level. Currently min-mod level. Decreased sensation to left side and inattention working on. Motor planning issues with affected limb. Mid-line orientation better this week. Cont B & B . Need family education with wife and son next week. DC 6/30  Revisions to Treatment Plan:  None      Jazmin Vensel, [name] 05/25/2016, 9:42 AM

## 2016-05-23 NOTE — Progress Notes (Signed)
Physical Therapy Note  Patient Details  Name: MOISHE LARCHER MRN: [id number] Date of Birth: [birth year] Today's Date: 05/23/2016  [id number], 75 min  Pain:none noted  neuromuscular re-education via manual cues, demo, for supine 10 x 1 each bil bridging, bil lower trunk rotation, modified abdominal crunches, L heel slides, L hip abd/adduction off and on bed.  PT educated pt on moving slowly for better precision and muscle recruitment.   Step ups at corner stairs with bil UEs on rails, with L foot laterally on 3" step, mod/max assist for alignment, decreasing L lateral lean.  Therapeutic activity :blocked practice for rolling L and pushing up with LUE without use of rail, min assist progressing to supervision.  Pt maintained sitting balance without cues,  and attended to LUE for donning shirt with mod cues, mod assist.    Pt left resting in w/c with quick release belt applied and all needs within reach.  Britt Petroni 05/23/2016, 7:49 AM

## 2016-05-23 NOTE — Consult Note (Signed)
NEUROBEHAVIORAL STATUS EXAM - CONFIDENTIAL Johnathan [name]   MEDICAL NECESSITY:  Johnathan [name] was seen on the [place] [facility] for a neurobehavioral status exam owing to the patient's diagnosis of CVA, and to assist in treatment planning during admission.   Records indicate that Johnathan [name] is a "65 y.o. right handed male with history of tobacco abuse on no prescription medications.Presented 05/12/2016 with left-sided weakness. Initial CT scan showed no definite acute findings. MRI of the brain showed extensive acute subacute infarct involving the posterior right MCA territory. Additional involvement of the medial posterior right frontal lobe along the posterior cingulate gyrus. Remote infarct in the right occipital lobe. 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 Interventional radiology underwent right ICA stent 05/13/2016. Patient remained on ventilator for a short time. Echocardiogram with ejection fraction of 60% no wall motion abnormalities. No cardiac source of emboli identified. Patient did not receive TPA. Neurology services maintain on aspirin and Plavix for CVA prophylaxis. Tolerating a regular consistency diet. Physical therapy evaluation completed 05/15/2016 with recommendations of physical medicine [name] consult. Patient was admitted for a comprehensive [name] program."   During today's visit, Johnathan [name] denied suffering from any major cognitive issues. MoCA administered by SLP was reportedly 26/30. His only stroke-related symptoms are purportedly related to let arm and leg motor deficits that he believes have significantly improved.   Emotionally, Johnathan [name] described his current mood as "a little depressed" secondary to his present medical situation and the fact that he is not in his typical environment. He has no history of mental illness and never needed any treatment for mood issues. He is  anxious to discharge home but is okay with staying as long as necessary to get better. No major adjustment issues endorsed. Suicidal/homicidal ideation, plan or intent was denied. No manic or hypomanic episodes were reported. The patient denied ever experiencing any auditory/visual hallucinations. No major behavioral or personality changes were endorsed.   Johnathan [name] feels that he has been making progress in therapy. No barriers to therapy identified. He described the rehab staff as "good." His has his wife, son, and his son's fianc (all of whom live in the home) to assist with the transition home. In fact, the son and fianc are CNAs. He also mentioned that his wife may take medical leave to assist with the transition home.   PROCEDURES: [2 units 96116] Diagnostic clinical interview  Review of available records  MENTAL STATUS EXAM: APPEARANCE:  Normal/appropriate GEN:  Alert and oriented MOOD:  Euthymic /mild depression      AFFECT:  Blunted   SPEECH:  Normal      THOUGHT CONTENT:  Appropriate HALLUCINATIONS:  None INTELLIGENCE:  Average  INSIGHT:  Good JUDGMENT:  Good SUICIDAL IDEATION:  Denies SI   HOMICIDAL IDEATION:   Denies HI   IMPRESSION: Overall, Johnathan [name] denied suffering from any post-stroke cognitive deficits and no overt issues were observed. He described experiencing a mild adjustment reaction with minimal depression secondary to his present medical circumstances, though I think it's subclinical and does not require treatment.  He also appears to have adequate social support via family and friends. At present, I see no need for further cognitive assessment or need for formal follow-up via neuropsychology. If he is still on the unit next week, I plan to informally touch base with him to check on mood.   DIAGNOSIS:  CVA    Rutha Bouchard, Psy.D., ABN  Board-Certified Clinical Neuropsychologist

## 2016-05-23 NOTE — Progress Notes (Cosign Needed)
Occupational Therapy Session Note  Patient Details  Name: Johnathan [name] MRN: [id number] Date of Birth: [birth year]  Today's Date: 05/23/2016 OT Individual Time:  - 60 minutes and 45 minutes      Skilled Therapeutic Interventions/Progress Updates:    Pt participated in tx focused on neuromuscular re education to improve postural control during ADL completion. Pt required Mod A for squat pivot transfer to gym mat from w/c. L knee continues to buckle and requires assistance for stabilization during transfers. Pt completed lateral reaching tasks in standing with Mod A for maintaining upright postural alignment. Task graded to facilitate anterior pelvic tilt due to pt tendency to exhibit posterior pelvic tilt at rest. Synergy patterning still present when incorporating L UE into tx with manual cues provided at head to promote typical movement patterns. Standing balance is improving, oral care and deodorant application completed standing at sink with steadying assistance and stabilization of L knee. Pt requires manual cues to raise awareness of posture and L LE positioning. Pt left in w/c with QRB and all needs within reach.    2nd Tx 1:1 1301-1348 Pt participated in tx focusing on decreasing LOA for UB dressing. Pt completed UB donning/doffing x5 trials with max vcs and demonstrative cues for orientation and carryover of hemi technique. Pt still exhibits severe visual perceptual deficits and motor apraxia that impedes abilities for donning shirt. Pt tends to present with associative movement patterns, particularly gripping with left hand while right hand orients tshirt. Left neglect still present with pt requiring cues to attend to L UE to push sleeve over elbow and to pull down shirt down in back. Pt demonstrated difficultly with feeding left hand into shirt sleeve with right hand, and therefore was provided education and instruction of alternative adaptive technique of gathering base of shirt to left  sleeve and guiding left hand through sleeve. Pt required extra time for problem solving, still requires max A for orientation of shirt. Pt continues to benefit from visual perceptual skill retraining continued completion of UB dressing to decrease LOA during self care tasks. Pt left in w/c at end of session with QRB and all needs within reach.      Therapy Documentation Precautions:  Precautions Precautions: Fall Precaution Comments: L sided neglect, apraxia Restrictions Weight Bearing Restrictions: No    Pain: Pain Assessment Pain Assessment: No/denies pain      See Function Navigator for Current Functional Status.   Therapy/Group: Individual Therapy  Johnathan [name] 05/23/2016, 2:29 PM

## 2016-05-23 NOTE — Progress Notes (Signed)
 Subjective/Complaints: When am I going home?  Review of Systems - Negative except insomnia, denies CP/SOB/ bowel or bladder issues  Objective: Vital Signs: Blood pressure 129/61, pulse 59, temperature 98.2 F (36.8 C), temperature source Oral, resp. rate 18, height 5' 6" (1.676 m), weight 73.8 kg (162 lb 11.2 oz), SpO2 97 %. No results found. Results for orders placed or performed during the hospital encounter of 05/16/16 (from the past 72 hour(s))  Prealbumin     Status: None   Collection Time: 05/22/16  7:57 AM  Result Value Ref Range   Prealbumin 22.6 18 - 38 mg/dL     HEENT: normal Cardio: RRR and no murmur Resp: CTA B/L and unlabored GI: BS positive and NT, ND Extremity:  No Edema Skin:   Bruise left forearm, left pectoral with small firm 1cm mass in area of bruising Neuro: Alert/Oriented, Abnormal Sensory absent LT, LUE and LLE, Abnormal Motor 3- LUE, 4- LLE, Abnormal FMC Ataxic/ dec FMC and Inattention Musc/Skel:  Normal Gen NAD   Assessment/Plan: 1. 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 Physiatrist is providing close team supervision and 24 hour management of active medical problems listed below. Physiatrist and rehab team continue to assess barriers to discharge/monitor patient progress toward functional and medical goals. FIM: Function - Bathing Position: Shower Body parts bathed by patient: Right arm, Left arm, Abdomen, Chest, Front perineal area, Buttocks, Right upper leg, Left upper leg, Right lower leg, Left lower leg Body parts bathed by helper: Back Assist Level: Touching or steadying assistance(Pt > 75%)  Function- Upper Body Dressing/Undressing What is the patient wearing?: Pull over shirt/dress Pull over shirt/dress - Perfomed by patient: Thread/unthread right sleeve, Put head  through opening Pull over shirt/dress - Perfomed by helper: Thread/unthread left sleeve, Pull shirt over trunk Assist Level: Touching or steadying assistance(Pt > 75%) Function - Lower Body Dressing/Undressing What is the patient wearing?: Underwear, Pants, Shoes Position: Wheelchair/chair at sink Underwear - Performed by patient: Thread/unthread right underwear leg, Thread/unthread left underwear leg, Pull underwear up/down Underwear - Performed by helper: Pull underwear up/down, Thread/unthread left underwear leg, Thread/unthread right underwear leg Pants- Performed by patient: Thread/unthread right pants leg, Thread/unthread left pants leg, Pull pants up/down Pants- Performed by helper: Fasten/unfasten pants Non-skid slipper socks- Performed by patient: Don/doff right sock, Don/doff left sock Non-skid slipper socks- Performed by helper: Don/doff right sock, Don/doff left sock Shoes - Performed by patient: Don/doff left shoe, Don/doff right shoe Shoes - Performed by helper: Don/doff left shoe Assist for footwear: Supervision/touching assist Assist for lower body dressing: Touching or steadying assistance (Pt > 75%)  Function - Toileting Toileting activity did not occur: N/A Toileting steps completed by patient: Performs perineal hygiene, Adjust clothing after toileting, Adjust clothing prior to toileting Toileting steps completed by helper: Adjust clothing prior to toileting, Performs perineal hygiene, Adjust clothing after toileting (per Erin Neal, NT) Assist level: Two helpers (per Jasmine Adger, NT)  Function - Toilet Transfers Assist level to toilet: 2 helpers (per Erin Neal, NT)  Function - Chair/bed transfer Chair/bed transfer method: Other (stand step) Chair/bed transfer assist level: Moderate assist (Pt 50 - 74%/lift or lower) Chair/bed transfer assistive device: Armrests Chair/bed transfer details: Tactile cues for weight shifting, Tactile cues for posture, Tactile cues for  placement, Verbal cues for technique  Function - Locomotion: Wheelchair Will patient use wheelchair at discharge?: Yes   Type: Manual Max wheelchair distance: 150 Assist Level: Supervision or verbal cues Assist Level: Supervision or verbal cues Wheel 150 feet activity did not occur: Safety/medical concerns Assist Level: Supervision or verbal cues Turns around,maneuvers to table,bed, and toilet,negotiates 3% grade,maneuvers on rugs and over doorsills: No Function - Locomotion: Ambulation Assistive device: Hand held assist Max distance: 150 Assist level: Moderate assist (Pt 50 - 74%) Walk 10 feet activity did not occur: Safety/medical concerns Assist level: Moderate assist (Pt 50 - 74%) Walk 50 feet with 2 turns activity did not occur: Safety/medical concerns Assist level: Moderate assist (Pt 50 - 74%) Walk 150 feet activity did not occur: Safety/medical concerns Assist level: Moderate assist (Pt 50 - 74%) Walk 10 feet on uneven surfaces activity did not occur: Safety/medical concerns  Function - Comprehension Comprehension: Auditory, Visual Comprehension assist level: Follows complex conversation/direction with no assist  Function - Expression Expression: Verbal, Nonverbal Expression assist level: Expresses complex ideas: With extra time/assistive device  Function - Social Interaction Social Interaction assist level: Interacts appropriately 90% of the time - Needs monitoring or encouragement for participation or interaction.  Function - Problem Solving Problem solving assist level: Solves basic 75 - 89% of the time/requires cueing 10 - 24% of the time  Function - Memory Memory assist level: Recognizes or recalls 90% of the time/requires cueing < 10% of the time Patient normally able to recall (first 3 days only): That he or she is in a hospital   Medical Problem List and Plan: 1.  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 Formulized individual treatment plan based on medical history, underlying problem and comorbidities. 2.  DVT Prophylaxis/Anticoagulation: SCDs Monitor for any signs of DVT 3. Pain Management: Tylenol as needed 4. Hypertension. Permissive hypertension and monitor with increased mobility Filed Vitals:   05/22/16 1603 05/23/16 0517  BP: 149/87 129/61  Pulse: 69 59  Temp: 98.1 F (36.7 C) 98.2 F (36.8 C)  Resp: 18 18   5. Neuropsych: This patient is capable of making decisions on his own behalf. 6. Skin/Wound Care: Routine skin checks, calcified hematoma 7. Fluids/Electrolytes/Nutrition: Routine I&O's with follow-up chemistries, 75-100% meals,improved,  8. Hyperlipidemia. Lipitor 9. Tobacco abuse. Nicoderm patch. Provided counseling. 10. Hypokalemia: Cont to monitor and replete as necessary.will increase KCL supplementation 11. Hypophosphatemia: Cont to monitor and replete as necessary. 12.  Hypoalbuminemia start prostat,check pre alb 6/20 normal, may d/c prostat, pt dislikes the taste 13. Insomnia improved with trazodone  LOS (Days) 7 A FACE TO FACE EVALUATION WAS PERFORMED  KIRSTEINS,ANDREW E 05/23/2016, 7:27 AM    

## 2016-05-24 ENCOUNTER — Inpatient Hospital Stay (HOSPITAL_COMMUNITY): Payer: 59

## 2016-05-24 ENCOUNTER — Inpatient Hospital Stay (HOSPITAL_COMMUNITY): Payer: 59 | Admitting: Occupational Therapy

## 2016-05-24 NOTE — Progress Notes (Signed)
Physical Therapy Weekly Progress Note  Patient Details  Name: Johnathan [name] MRN: [id number] Date of Birth: [birth year]  Beginning of progress report period: 05/17/16 End of progress report period: 05/24/16  Today's Date: 05/24/2016 PT Individual Time: 0800-0920 PT Individual Time Calculation (min): 80 min  Patient has met 4 of 4 short term goals.    Patient continues to demonstrate the following deficits: motor, awareness, balance, coordination, apraxia, L inattention,  Mobility and locomotion, and therefore will continue to benefit from skilled PT intervention to enhance overall performance with balance, postural control, ability to compensate for deficits, functional use of  left upper extremity and left lower extremity, attention, awareness and coordination.  Patient progressing toward long term goals..  Continue plan of care.  PT Short Term Goals Week 1:  PT Short Term Goal 1 (Week 1): Patient will perform bed mobility with min A from PT  PT Short Term Goal 1 - Progress (Week 1): Met PT Short Term Goal 2 (Week 1): Patient will performed stand pivot transfer with mod A  PT Short Term Goal 2 - Progress (Week 1): Met PT Short Term Goal 3 (Week 1): Patient will ambulate for 25 ft with max A and LRAD  PT Short Term Goal 3 - Progress (Week 1): Met PT Short Term Goal 4 (Week 1): Patient will propel WC for 160f with supervision A  PT Short Term Goal 4 - Progress (Week 1): Met  Skilled Therapeutic Interventions/Progress Updates:   Pt used toilet for continent B and B.  Pt stood at sink to wash hands.  Pt donned shirt in sitting with min assist for L UE due to apraxia, with significant extra time. W/c propulsion using hemi method with supervision for safety of LUE.  neuromuscular re-education via forced use, mirror and manual feedback for = wt bearing in standing in stander, and bil terminal hip extension x 20 in stander, and wt shifting L><R without use of UEs on stander table  Increased L LE  wt bearing andL trunk elongation/R trunk shortening quite visible in stander. Therapeutic activity in stander, reaching out of BOS to R with R hand to facilitate wt shift to R. Gait without AD, with L heel lift to decrease over shift to L, x 50' including several turns, mod assist.  Seated with feet supported> unsupported for pelvic dissociaition for reciprocal scooting, and facilitating R elongation/L trunk shortening with reaching out of BOS to R.  Pt has great difficulty with L trunk shortening and pelvic dissociation.  Pt left resting in w/c with quick release belt applied and all needs within reach.     Therapy Documentation Precautions:  Precautions Precautions: Fall Precaution Comments: L sided neglect, apraxia Restrictions Weight Bearing Restrictions: No   Pain: none reported       See Function Navigator for Current Functional Status.  Therapy/Group: Individual Therapy  Alea Ryer 05/24/2016, 9:31 AM

## 2016-05-24 NOTE — Progress Notes (Signed)
Subjective/Complaints: Poor sleep.  No pain, bowel or bladder issues.  Discussed psych note, mild depression Wife may need FMLA form  Review of Systems - Negative except insomnia, denies CP/SOB/ bowel or bladder issues  Objective: Vital Signs: Blood pressure 134/68, pulse 60, temperature 98.1 F (36.7 C), temperature source Oral, resp. rate 17, height 5\' 6"  (1.676 m), weight 74.1 kg (163 lb 5.8 oz), SpO2 97 %. No results found. Results for orders placed or performed during the hospital encounter of 05/16/16 (from the past 72 hour(s))  Prealbumin     Status: None   Collection Time: 05/22/16  7:57 AM  Result Value Ref Range   Prealbumin 22.6 18 - 38 mg/dL     HEENT: normal Cardio: RRR and no murmur Resp: CTA B/L and unlabored GI: BS positive and NT, ND Extremity:  No Edema Skin:   Bruise left forearm, left pectoral with small firm 1cm mass in area of bruising Neuro: Alert/Oriented, Abnormal Sensory absent LT, LUE and LLE, Abnormal Motor 3- LUE, 4- LLE, Abnormal FMC Ataxic/ dec FMC and Inattention, able to do finger to thumb with eyes closed Musc/Skel:  Normal Gen NAD   Assessment/Plan: 1. 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 Physiatrist is providing close team supervision and 24 hour management of active medical problems listed below. Physiatrist and rehab team continue to assess barriers to discharge/monitor patient progress toward functional and medical goals. FIM: Function - Bathing Position: Shower Body parts bathed by patient: Right arm, Left arm, Abdomen, Chest, Front perineal area, Buttocks, Right upper leg, Left upper leg, Right lower leg, Left lower leg Body parts bathed by helper: Back Assist Level: Touching or steadying assistance(Pt > 75%)  Function- Upper Body Dressing/Undressing What  is the patient wearing?: Pull over shirt/dress Pull over shirt/dress - Perfomed by patient: Thread/unthread right sleeve, Put head through opening Pull over shirt/dress - Perfomed by helper: Thread/unthread right sleeve, Thread/unthread left sleeve, Put head through opening, Pull shirt over trunk Assist Level: Touching or steadying assistance(Pt > 75%) Function - Lower Body Dressing/Undressing What is the patient wearing?: Underwear, Pants, Shoes Position: Wheelchair/chair at sink Underwear - Performed by patient: Thread/unthread right underwear leg, Thread/unthread left underwear leg, Pull underwear up/down Underwear - Performed by helper: Pull underwear up/down, Thread/unthread left underwear leg, Thread/unthread right underwear leg Pants- Performed by patient: Thread/unthread right pants leg, Thread/unthread left pants leg, Pull pants up/down Pants- Performed by helper: Fasten/unfasten pants Non-skid slipper socks- Performed by patient: Don/doff right sock, Don/doff left sock Non-skid slipper socks- Performed by helper: Don/doff right sock, Don/doff left sock Shoes - Performed by patient: Don/doff left shoe, Don/doff right shoe Shoes - Performed by helper: Don/doff left shoe Assist for footwear: Supervision/touching assist Assist for lower body dressing: Touching or steadying assistance (Pt > 75%)  Function - Toileting Toileting activity did not occur: N/A Toileting steps completed by patient: Adjust clothing prior to toileting, Performs perineal hygiene, Adjust clothing after toileting Toileting steps completed by helper: Adjust clothing prior to toileting, Performs perineal hygiene, Adjust clothing after toileting (per Elby Beck, NT) Assist level: Touching or steadying assistance (Pt.75%)  Function - Toilet Transfers Assist level to toilet: 2 helpers (per Elby Beck, NT)  Function - Chair/bed transfer Chair/bed transfer method: Other (stand step) Chair/bed transfer assist level:  Moderate assist (Pt 50 - 74%/lift or lower) Chair/bed transfer assistive device: Armrests Chair/bed transfer  details: Tactile cues for weight shifting, Tactile cues for posture, Tactile cues for placement, Verbal cues for technique  Function - Locomotion: Wheelchair Will patient use wheelchair at discharge?: Yes Type: Manual Max wheelchair distance: 150 Assist Level: Supervision or verbal cues Assist Level: Supervision or verbal cues Wheel 150 feet activity did not occur: Safety/medical concerns Assist Level: Supervision or verbal cues Turns around,maneuvers to table,bed, and toilet,negotiates 3% grade,maneuvers on rugs and over doorsills: No Function - Locomotion: Ambulation Assistive device: Hand held assist Max distance: 150 Assist level: Moderate assist (Pt 50 - 74%) Walk 10 feet activity did not occur: Safety/medical concerns Assist level: Moderate assist (Pt 50 - 74%) Walk 50 feet with 2 turns activity did not occur: Safety/medical concerns Assist level: Moderate assist (Pt 50 - 74%) Walk 150 feet activity did not occur: Safety/medical concerns Assist level: Moderate assist (Pt 50 - 74%) Walk 10 feet on uneven surfaces activity did not occur: Safety/medical concerns  Function - Comprehension Comprehension: Auditory, Visual Comprehension assist level: Follows complex conversation/direction with no assist  Function - Expression Expression: Verbal, Nonverbal Expression assist level: Expresses complex ideas: With no assist  Function - Social Interaction Social Interaction assist level: Interacts appropriately with others - No medications needed.  Function - Problem Solving Problem solving assist level: Solves basic 90% of the time/requires cueing < 10% of the time  Function - Memory Memory assist level: Recognizes or recalls 90% of the time/requires cueing < 10% of the time Patient normally able to recall (first 3 days only): That he or she is in a hospital   Medical  Problem List and Plan: 1.  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  DVT Prophylaxis/Anticoagulation: SCDs Monitor for any signs of DVT 3. Pain Management: Tylenol as needed 4. Hypertension. In good range Filed Vitals:   05/23/16 1450 05/24/16 0541  BP: 141/87 134/68  Pulse: 88 60  Temp: 98.2 F (36.8 C) 98.1 F (36.7 C)  Resp: 18 17   5. Neuropsych: This patient is capable of making decisions on his own behalf. 6. Skin/Wound Care: Routine skin checks, probable calcified hematoma 7. Fluids/Electrolytes/Nutrition: Routine I&O's with follow-up chemistries, 75-100% meals,improved,  8. Hyperlipidemia. Lipitor 9. Tobacco abuse. Nicoderm patch. Provided counseling. 10. Hypokalemia: Cont to monitor and replete as necessary.will increase KCL supplementation 11. Hypophosphatemia: Cont to monitor and replete as necessary. 12.  Hypoalbuminemia start prostat,check pre alb 6/20 normal, may d/c prostat, pt dislikes the taste 13. Insomnia one good night with  trazodone then poor sleep last noc, didn't want to increase dose yet  LOS (Days) 8 A FACE TO FACE EVALUATION WAS PERFORMED  Saadia Dewitt E 05/24/2016, 7:19 AM

## 2016-05-24 NOTE — Progress Notes (Signed)
Patient slept well throughout the night (see sleep chart) following administration of prn trazodone and prn xanax. Patient did use grounds pass to go outside with wife and other family members. Patient was very pleased. No complaints of pain noted. States he feels "much better" after getting some rest.

## 2016-05-24 NOTE — Progress Notes (Signed)
Occupational Therapy Daily and  Weekly Progress Note  Patient Details  Name: NORTON BIVINS MRN: [id number] Date of Birth: 1951-11-29     Beginning of progress report period: 05/17/16  End of progress report period: 05/24/16   Today's Date: 05/24/2016 OT Individual Time: 1101-1203 60 minutes 1101-1201 OT Individual Time Calculation (min): 62 min   Patient has met 5 of 5 short term goals.  At time of eval, pt completed UB/LB ADLs with Max A demonstrating multiple LOBs while sitting and standing during self care completion. At time of report pt completes UB bathing with supervision on tub bench while maintaining static and dynamic sitting balance. Pt requires Min A for steadying assistance while standing in shower for frontal pericare and buttocks with manual cues provided at left knee to fully extend. Pt completes functional stand step transfers with Min A for steadying assist and manual cues for upright postural alignment at pelvis and left knee. Pt requires Min A for LB dressing as well for steadying assistance during hiking of pants. Pt now integrates L UE grossly into ADL tasks and visually scans to left side without verbal cues. Pt continues to have difficultly with donning shirt due to impaired visual perceptual skills. Pt also continues to demonstrate posterior pelvic tilt during LB dressing and left lean during static sitting. Pt continues to benefit from ADL retraining, with increased focus on increasing postural awareness, functional use of L UE, and visual perceptual skills. Recommend skilled OT until anticipated d/c next week, new goal established includes family education for transition planning and DME needs prn.    Patient progressing toward long term goals..  Continue plan of care.   Skilled Therapeutic Interventions/Progress Updates:    Today pt completed AM ADLs in shower and w/c level standing prn at sink. Pt completed UB bathing with supervision demonstrating improved postural  control during static and dynamic sitting. Pt still presents with posterior pelvic tilt and left leaning but did not result in LOB or inability to meet task demands. Pt completed LB bathing with steadying assistance standing with grab bars prn, LB dressing was completed with steadying assistance for standing during hike. Pt continues to require manual cuing for extending left knee for maintaining proper postural alignment during standing. UB dressing completed with overall Mod A on second attempt and max vcs for step by step instruction on sequencing and hemi technique. UB dressing appears to be most challenging for pt to complete at this time and pt continues to benefit from remediation of visual perceptual skills in order to decrease LOA for completing this task. Functional stand step transfer from w/c to shower bench completed with Min A for manual cuing at pelvis and left knee for facilitation of proper postural alignment.   Therapy Documentation Precautions:  Precautions Precautions: Fall Precaution Comments: L sided neglect, apraxia Restrictions Weight Bearing Restrictions: No      See Function Navigator for Current Functional Status.   Therapy/Group: Individual Therapy  Dorathea Faerber A Alexiah Koroma 05/24/2016, 4:36 PM

## 2016-05-24 NOTE — Progress Notes (Signed)
Occupational Therapy Session Note  Patient Details  Name: Johnathan [name] MRN: [id number] Date of Birth: 1951-03-18  Today's Date: 05/24/2016 OT Individual Time: [id number] Individual Time Calculation (min): 59 min    Skilled Therapeutic Interventions/Progress Updates:    Pt worked on Brewing technologist for the trunk, pelvis, and LUE during session.  Had pt sitting on balance disc for increased work on lateral weightshifts to the right in sitting.  Pt able to activate left trunk shortening with right trunk elongation during session.  Mod demonstrational cueing with min assist to maintain anterior pelvic tilt with lateral weightshifts.  Worked on incorporating functional reach with the LUE as well with use of UE ranger as well as reaching to pick up bean bags off of the bedside table placed at chest level.  Mod assist needed to keep him from overcompensating with trunk flexion before achieving elbow extension, when reaching for the bean bags.  Pt tends to flex his head which influences posterior pelvic tilt when reaching forward, needing cueing to keep his head in neutral extension.  He was able to grasp the bean bags efficiently demonstrating greater distal hand movement compared to proximal elbow and shoulder weakness.  Educated pt on importance of maintaining upright posture with neutral to anterior pelvic tilt as well as maintaining midline orientation and being more aware of the increased weightshift to the left in sitting that he currently demonstrates.  Took pt back to the room and placed pillow in back of chair to increase upright posture.  Also gave pt washcloth and instructed him to work on left elbow extension using the bedside table with hand on the washcloth, while not letting his back come forward off of the pillow.  Pt left in wheelchair with call button in reach.    Therapy Documentation Precautions:  Precautions Precautions: Fall Precaution Comments: L sided neglect,  apraxia Restrictions Weight Bearing Restrictions: No  Pain: Pain Assessment Pain Assessment: No/denies pain ADL: See Function Navigator for Current Functional Status.   Therapy/Group: Individual Therapy  Bionca Mckey OTR/L 05/24/2016, 4:38 PM

## 2016-05-25 ENCOUNTER — Inpatient Hospital Stay (HOSPITAL_COMMUNITY): Payer: 59 | Admitting: Occupational Therapy

## 2016-05-25 ENCOUNTER — Inpatient Hospital Stay (HOSPITAL_COMMUNITY): Payer: 59 | Admitting: *Deleted

## 2016-05-25 ENCOUNTER — Inpatient Hospital Stay (HOSPITAL_COMMUNITY): Payer: 59 | Admitting: Physical Therapy

## 2016-05-25 NOTE — Progress Notes (Signed)
Patient slept well throughout the night (see sleep chart). No complaints of pain noted. No acute distress.

## 2016-05-25 NOTE — Progress Notes (Signed)
Physical Therapy Session Note  Patient Details  Name: Johnathan [name] MRN: [id number] Date of Birth: 1951-04-24  Today's Date: 05/25/2016 PT Individual Time: 1300-1350 PT Individual Time Calculation (min): 50 min   Short Term Goals: Week 2:  PT Short Term Goal 1 (Week 2): = LTGs due to LOS  Skilled Therapeutic Interventions/Progress Updates:  Tx focused on functional mobility training, gait with RW and HHA, and NMR via forced use, manual facilitation, and multi-modal cues with Bi[employer] balance system.  Pt up in [facility], ready to go, Sit<>stands with min-guard at times due to increased L lean during transitional movements.   Gait in controlled setting x300' with min-guard A and cues for increasing step width and positioning in RW.  Gait with L HHA and Min A x50' and x150' with cues for step width and manual facilitation for LUE reciprocal swing.   Seated alternating cone taps for coordination training during rest break. Increased time required for LLE.   Biodex balance system training for increased visual feedback of midline orientation and weight shifting i nstanding. Pt performed approximately 50% accuracy with limits of stability training and 75% with weight shifting task with heavy UE reliance, challenged with no UE use.   Pt performed seated with feet supported pelvic dissociaition for reciprocal scooting, and facilitating R elongation/L trunk shortening with reaching out of BOS to R. Pt has great difficulty with L trunk shortening and pelvic dissociation.  Seated ball toss to self for bil UE task.  Pt left up in [facility] with lap belt all needs in reach.   Therapy Documentation Precautions:  Precautions Precautions: Fall Precaution Comments: L sided neglect, apraxia Restrictions Weight Bearing Restrictions: No General:   Vital Signs:   Pain: Pain Assessment Pain Assessment: No/denies pain Pain Score: 0-No pain   See Function Navigator for Current Functional  Status.   Therapy/Group: Individual Therapy  Soundra Pilon 05/25/2016, 12:38 PM

## 2016-05-25 NOTE — Progress Notes (Signed)
Occupational Therapy Session Note  Patient Details  Name: Johnathan [name] MRN: [id number] Date of Birth: 19-Dec-1950  Today's Date: 05/25/2016 OT Individual Time: 1100-1200 OT Individual Time Calculation (min): 60 min    Short Term Goals: Week 2:  OT Short Term Goal 1 (Week 2): Pt will complete UB/LB bathing wiht supervision/setup  OT Short Term Goal 2 (Week 2): Pt will maintain midline orientation while bathing on tub bench 90% of the time with mod cuing OT Short Term Goal 3 (Week 2): Pt will complete UB/LB dressing with supervision/setup OT Short Term Goal 4 (Week 2): Pt will complete toileting transfer and tasks with supervision/setup OT Short Term Goal 5 (Week 2): Family education will be completed regarding skilled OT d/c plan  Skilled Therapeutic Interventions/Progress Updates:    Pt completed self care mgt retraining tx focusing on improving postural alignment and left sided awareness during ADL tasks. Pt completed functional stand step transfer onto tub bench with Min A for for instruction on technique and manual cuing for extension of left knee. Pt instructed on sitting at edge of tub bench to facilitate anterior pelvic tilt during UB bathing. Pt required min cuing to maintain anterior pelvic tilt for 20% of time due to tendency to lean posteriorly. Pt was encouraged to integrate L UE into 30% of bathing tasks with pt dropping hand held showerhead and requiring assistance to correct. Dressing completed w/c level standing as needed at sink. Pt completes LB dressing with steadying assistance while hiking pants over hips and min vcs for attending to left side to hike completely. Pt was provided max vcs for self orientation of shirt and mod assistance. Decreased proprioceptive awareness, sensation, and fine motor control of left hand impedes pts ability to meet task demands of donning shirt. Decreased visual perception also noted during orientation with mod vcs to slow down task completion for  accuracy. Left hand continues to demonstrate associative reaction of grasping while right hand orients shirt. Pt left in w/c with QRB, half lap tray for left arm, and all needs within reach at end of tx.   2nd tx 1:1  1400-1430 Pt participated in afternoon session focusing on decreasing level of assistance with functional transfers during self care tasks. Pt completed stand step transfers without AD and Min-Mod A for manual cues for weight shifting, extending left knee, and mod vcs for raising awareness to postural alignment especially due to left leaning. Pt still exhibits decreased awareness and sensation in left UE and left LE. Max manual cues required for pt to reach back to w/c with L UE when transitioning from sit to stand. Side stepping transfer completed to improve weight shift and further decrease level of assistance with transferring with pt requiring Min-Mod A and manual facilitation of L LE at times due to left sided weakness and decreased proprioceptive awareness. Pt continues to benefit from transfer training to improve balance, postural alignment, and midline orientation for increasing independence with self care tasks. Pt left in w/c at end of session with QRB, half lap tray for left arm, and all needs within reach.   Therapy Documentation Precautions:  Precautions Precautions: Fall Precaution Comments: L sided neglect, apraxia Restrictions Weight Bearing Restrictions: No  Therapy Vitals Temp: 97.8 F (36.6 C) Temp Source: Oral Pulse Rate: 85 Resp: 18 BP: (!) 144/69 mmHg Patient Position (if appropriate): Sitting Oxygen Therapy SpO2: 99 % O2 Device: Not Delivered Pain: Pt did not report pain during tx sessions  See Function Navigator for Current Functional Status.   Therapy/Group: Individual Therapy  Raygen Dahm A Donnalee Cellucci 05/25/2016, 4:43 PM

## 2016-05-25 NOTE — Progress Notes (Signed)
Physical Therapy Session Note  Patient Details  Name: Johnathan [name] MRN: [id number] Date of Birth: 1951-11-12  Today's Date: 05/25/2016 PT Individual Time: 1000-1100 PT Individual Time Calculation (min): 60 min    Skilled Therapeutic Interventions/Progress Updates:    Pt received in w/c & agreeable to PT, denying c/o pain. Pt able to complete sit<>stand transfers with Min A<>supervision with cuing for hand placement on stable surface before transferring. Gait training room>through day room>BI gym with RW & steady A, but requiring Min A for 1 loss of balance due to pushing RW too far out in front when negotiating transition from carpet>tile. Utilized dynavision in [employer] with LUE only for neuro re-red, endurance & balance training. Pt with difficulty flexing & elevating L shoulder & has most difficult/increased reaction time to upper quadrants. Gait training x 150 BI gym>main gym with steady A, intermittent Min A due to loss of balance caused by decreased step width. PT provided mod A for controlled descent as pt lost balance posteriorly in chair; cued pt to reach back with BUE before transferring stand>sit. Stair training on 6 inch steps x 4 steps with B rails & min A. Provided verbal cuing for compensatory technique & to advance LUE. Gait training x 150 ft back to room with RW, with pt demonstrating proper hand placement with cuing when transferring to sitting in w/c. At end of session pt left in w/c with QRB in place & all needs within reach.   Therapy Documentation Precautions:  Precautions Precautions: Fall Precaution Comments: L sided neglect, apraxia Restrictions Weight Bearing Restrictions: No  Pain: Pain Assessment Pain Assessment: No/denies pain   See Function Navigator for Current Functional Status.   Therapy/Group: Individual Therapy  Waunita Schooner 05/25/2016, 7:57 AM

## 2016-05-25 NOTE — Progress Notes (Signed)
Subjective/Complaints: Slept ok Discussed sensory deficits in L arm and need to avoid using sharp objects, hot objects and power tools  Review of Systems - Negative except insomnia, denies CP/SOB/ bowel or bladder issues  Objective: Vital Signs: Blood pressure 136/76, pulse 66, temperature 97.7 F (36.5 C), temperature source Oral, resp. rate 18, height 5\' 6"  (1.676 m), weight 74.2 kg (163 lb 9.3 oz), SpO2 97 %. No results found. Results for orders placed or performed during the hospital encounter of 05/16/16 (from the past 72 hour(s))  Prealbumin     Status: None   Collection Time: 05/22/16  7:57 AM  Result Value Ref Range   Prealbumin 22.6 18 - 38 mg/dL     HEENT: normal Cardio: RRR and no murmur Resp: CTA B/L and unlabored GI: BS positive and NT, ND Extremity:  No Edema Skin:   Bruise left forearm, left pectoral with small firm 1cm mass in area of bruising Neuro: Alert/Oriented, Abnormal Sensory absent LT, LUE and LLE, Abnormal Motor 3- LUE, 4- LLE, Abnormal FMC Ataxic/ dec FMC and Inattention, able to do finger to thumb with eyes closed Musc/Skel:  Normal Gen NAD   Assessment/Plan: 1. 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 Physiatrist is providing close team supervision and 24 hour management of active medical problems listed below. Physiatrist and rehab team continue to assess barriers to discharge/monitor patient progress toward functional and medical goals. FIM: Function - Bathing Position: Shower Body parts bathed by patient: Right arm, Left arm, Chest, Abdomen, Front perineal area, Buttocks, Right upper leg, Left upper leg, Right lower leg, Left lower leg, Back Body parts bathed by helper: Back Assist Level: Touching or steadying assistance(Pt > 75%)  Function- Upper Body  Dressing/Undressing What is the patient wearing?: Pull over shirt/dress Pull over shirt/dress - Perfomed by patient: Pull shirt over trunk, Put head through opening Pull over shirt/dress - Perfomed by helper: Thread/unthread right sleeve, Thread/unthread left sleeve Assist Level: Touching or steadying assistance(Pt > 75%) Function - Lower Body Dressing/Undressing What is the patient wearing?: Underwear, Pants, Shoes, Socks Position: Wheelchair/chair at [employer] - Performed by patient: Thread/unthread right underwear leg, Thread/unthread left underwear leg, Pull underwear up/down Underwear - Performed by helper: Pull underwear up/down, Thread/unthread left underwear leg, Thread/unthread right underwear leg Pants- Performed by patient: Thread/unthread right pants leg, Thread/unthread left pants leg, Pull pants up/down Pants- Performed by helper: Fasten/unfasten pants Non-skid slipper socks- Performed by patient: Don/doff right sock, Don/doff left sock Non-skid slipper socks- Performed by helper: Don/doff right sock, Don/doff left sock Socks - Performed by patient: Don/doff right sock, Don/doff left sock Shoes - Performed by patient: Don/doff right shoe, Don/doff left shoe Shoes - Performed by helper: Don/doff left shoe Assist for footwear: Supervision/touching assist Assist for lower body dressing: Touching or steadying assistance (Pt > 75%)  Function - Toileting Toileting activity did not occur: N/A Toileting steps completed by patient: Adjust clothing prior to toileting, Performs perineal hygiene Toileting steps completed by helper: Adjust clothing after toileting Toileting Assistive Devices: Grab bar or rail Assist level: Touching or steadying assistance (Pt.75%)  Function - Air cabin crew transfer assistive device: Grab bar Assist level to toilet: Touching or steadying assistance (Pt > 75%) Assist level from toilet: Moderate assist (Pt 50 - 74%/lift or  lower)  Function - Chair/bed transfer Chair/bed transfer method: Other (stand step) Chair/bed transfer assist  level: Moderate assist (Pt 50 - 74%/lift or lower) Chair/bed transfer assistive device: Armrests Chair/bed transfer details: Tactile cues for weight shifting, Tactile cues for posture, Tactile cues for placement, Verbal cues for technique, Manual facilitation for weight shifting  Function - Locomotion: Wheelchair Will patient use wheelchair at discharge?: Yes Type: Manual Max wheelchair distance: 150 Assist Level: Supervision or verbal cues Assist Level: Supervision or verbal cues Wheel 150 feet activity did not occur: Safety/medical concerns Assist Level: Supervision or verbal cues Turns around,maneuvers to table,bed, and toilet,negotiates 3% grade,maneuvers on rugs and over doorsills: No Function - Locomotion: Ambulation Assistive device: Hand held assist Max distance: 100 Assist level: Moderate assist (Pt 50 - 74%) Walk 10 feet activity did not occur: Safety/medical concerns Assist level: Moderate assist (Pt 50 - 74%) Walk 50 feet with 2 turns activity did not occur: Safety/medical concerns Assist level: Moderate assist (Pt 50 - 74%) Walk 150 feet activity did not occur: Safety/medical concerns Assist level: Moderate assist (Pt 50 - 74%) Walk 10 feet on uneven surfaces activity did not occur: Safety/medical concerns  Function - Comprehension Comprehension: Auditory, Visual Comprehension assist level: Follows basic conversation/direction with extra time/assistive device  Function - Expression Expression: Verbal Expression assist level: Expresses complex ideas: With extra time/assistive device  Function - Social Interaction Social Interaction assist level: Interacts appropriately 90% of the time - Needs monitoring or encouragement for participation or interaction.  Function - Problem Solving Problem solving assist level: Solves basic 75 - 89% of the time/requires  cueing 10 - 24% of the time  Function - Memory Memory assist level: Recognizes or recalls 90% of the time/requires cueing < 10% of the time Patient normally able to recall (first 3 days only): That he or she is in a hospital   Medical Problem List and Plan: 1.  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  DVT Prophylaxis/Anticoagulation: SCDs Monitor for any signs of DVT 3. Pain Management: Tylenol as needed 4. Hypertension. In good range Filed Vitals:   05/24/16 1443 05/25/16 0546  BP: 114/79 136/76  Pulse: 83 66  Temp: 98.1 F (36.7 C) 97.7 F (36.5 C)  Resp: 18 18   5. Neuropsych: This patient is capable of making decisions on his own behalf. 6. Skin/Wound Care: Routine skin checks, probable calcified hematoma 7. Fluids/Electrolytes/Nutrition: Routine I&O's with follow-up chemistries, 75-100% meals,improved,  8. Hyperlipidemia. Lipitor 9. Tobacco abuse. Nicoderm patch. Provided counseling. 10. Hypokalemia: Cont to monitor and replete as necessary.will increase KCL supplementation BMET- will repeat    Component Value Date/Time   NA 136 05/17/2016 0539   K 3.3* 05/17/2016 0539   CL 103 05/17/2016 0539   CO2 25 05/17/2016 0539   GLUCOSE 101* 05/17/2016 0539   BUN 15 05/17/2016 0539   CREATININE 0.95 05/17/2016 0539   CALCIUM 8.5* 05/17/2016 0539   GFRNONAA >60 05/17/2016 0539   GFRAA >60 05/17/2016 0539      12. Insomnia- trazodone  LOS (Days) 9 A FACE TO FACE EVALUATION WAS PERFORMED  Johnathan [name] E 05/25/2016, 6:50 AM

## 2016-05-26 ENCOUNTER — Inpatient Hospital Stay (HOSPITAL_COMMUNITY): Payer: 59 | Admitting: Occupational Therapy

## 2016-05-26 ENCOUNTER — Inpatient Hospital Stay (HOSPITAL_COMMUNITY): Payer: 59

## 2016-05-26 LAB — BASIC METABOLIC PANEL
Anion gap: 7 (ref 5–15)
BUN: 16 mg/dL (ref 6–20)
CO2: 27 mmol/L (ref 22–32)
Calcium: 9.2 mg/dL (ref 8.9–10.3)
Chloride: 102 mmol/L (ref 101–111)
Creatinine, Ser: 0.94 mg/dL (ref 0.61–1.24)
GFR calc Af Amer: >60 mL/min (ref >60)
GFR calc non Af Amer: >60 mL/min (ref >60)
Glucose, Bld: 97 mg/dL (ref 65–99)
Potassium: 4.1 mmol/L (ref 3.5–5.1)
Sodium: 136 mmol/L (ref 135–145)

## 2016-05-26 NOTE — Progress Notes (Signed)
Physical Therapy Session Note  Patient Details  Name: Johnathan [name] MRN: [id number] Date of Birth: 12-May-1951  Today's Date: 05/26/2016 PT Individual Time: 1100-1200 PT Individual Time Calculation (min): 60 min   Short Term Goals: Week 2:  PT Short Term Goal 1 (Week 2): = LTGs due to LOS  Skilled Therapeutic Interventions/Progress Updates:    Session focused on functional gait training with and without AD to challenge balance and neuro re-ed to address LLE weightbearing, coordination, balance, and motor control, and strengthening. Sit <> stands with 4" block under RLE while performing functional reaching task with LUE with overall steady assist. Neuro re-ed activities including the following. Number spots on the floor for pt to work on coordination and balance with patterns verbalized by therapist and pt to tap with L foot with min to mod assist for balance. Kinetron in standing position for functional strengthening as well as balance training (sit to stands and keeping pedals even). Pt with need for BUE support to complete, required up to max assist without UE support when attempting weightshifting. Stair negotiation alternating LE for neuro re-ed and challenge to balance - cues to attend to L hand placement on rail especially when descending (tendency to leave arm behind). Lateral side stepping while kicking yoga block with UE support and cues for upright posture and attention to body position to maintain lateral stepping with min assist. Pt able to perform gait with steadying assist overall and cues for upright posture, widening BOS, and L foot placement at times. End of session returned to w/c and set-up with lunch with safety belt donned.   Therapy Documentation Precautions:  Precautions Precautions: Fall Precaution Comments: L sided neglect, apraxia Restrictions Weight Bearing Restrictions: No  Pain: Denies pain.    See Function Navigator for Current Functional  Status.   Therapy/Group: Individual Therapy  Canary Brim Ivory Broad, PT, DPT  05/26/2016, 12:05 PM

## 2016-05-26 NOTE — Progress Notes (Signed)
Occupational Therapy Session Note  Patient Details  Name: Johnathan [name] MRN: [id number] Date of Birth: 1951/05/19  Today's Date: 05/26/2016 OT Individual Time: 0916-1000 OT Individual Time Calculation (Johnathan): 44 Johnathan    Short Term Goals: Week 2:  OT Short Term Goal 1 (Week 2): Pt will complete UB/LB bathing wiht supervision/setup  OT Short Term Goal 2 (Week 2): Pt will maintain midline orientation while bathing on tub bench 90% of the time with mod cuing OT Short Term Goal 3 (Week 2): Pt will complete UB/LB dressing with supervision/setup OT Short Term Goal 4 (Week 2): Pt will complete toileting transfer and tasks with supervision/setup OT Short Term Goal 5 (Week 2): Family education will be completed regarding skilled OT d/c plan  Skilled Therapeutic Interventions/Progress Updates:    Worked on neuromuscular re-education during session.  Had pt propel his wheelchair to the gym using just his LEs.  Pt with increased difficulty coordinating RUE along with LEs resulting in dragging of the left foot, so eliminated use of UEs for this task.  He was able to transfer to the therapy mat with supervision.  Worked on lateral weight shifts in sitting and standing to the right to increase left trunk activation and decrease lean to the left.  Incorporated reaching down to the floor to pick up clothespins with the LUE and then translate them to the right hand for placement on vertical bar.  Johnathan assist needed for balance and facilitation of left knee extension when returning from squat to stand.  Also had pt work on right UE reach at chest level to pick up cup without compensation at trunk.  He needs max facilitation to avoid trunk flexion when attempting to extend the left arm for attempting to pick up the cup.  Returned to room at end of session.  This time had pt propel wheelchair with hemi technique of using just the RUE and RLE.  He was able to again complete with supervision but needed mod instructional  cueing to stop, lock the wheelchair brakes, and scoot back in the chair as his hips would scoot forward during wheelchair mobility.  Discussed with PT about getting a lower chair to help him propel as he is stating he is "bored as hell" in his room.  This therapist removed wheelchair cushion this session to allow for greater use of LEs to help with wheelchair mobility.  Pt left in room with safety belt in place, call button in reach, and 1/2 lap tray supporting LUE.  Pt encouraged to continue FM and gross motor coordination exercises that were shown earlier in the week.    Therapy Documentation Precautions:  Precautions Precautions: Fall Precaution Comments: L sided neglect, apraxia Restrictions Weight Bearing Restrictions: No  Pain: Pain Assessment Pain Assessment: No/denies pain ADL: See Function Navigator for Current Functional Status.   Therapy/Group: Individual Therapy  Otelia Hettinger OTR/L 05/26/2016, 10:47 AM

## 2016-05-26 NOTE — Progress Notes (Signed)
Subjective/Complaints: Would like grounds pass  Review of Systems - Negative except insomnia, denies CP/SOB/ bowel or bladder issues  Objective: Vital Signs: Blood pressure 134/67, pulse 61, temperature 98.2 F (36.8 C), temperature source Oral, resp. rate 17, height 5\' 6"  (1.676 m), weight 74.2 kg (163 lb 9.3 oz), SpO2 97 %. No results found. No results found for this or any previous visit (from the past 72 hour(s)).   HEENT: normal Cardio: RRR and no murmur Resp: CTA B/L and unlabored GI: BS positive and NT, ND Extremity:  No Edema Skin:   Bruise left forearm, left pectoral with small firm 1cm mass in area of bruising Neuro: Alert/Oriented, Abnormal Sensory absent LT, LUE and LLE, Abnormal Motor 3- LUE, 4- LLE, Abnormal FMC Ataxic/ dec FMC and Inattention, able to do finger to thumb with eyes closed Musc/Skel:  Normal Gen NAD   Assessment/Plan: 1. 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 Physiatrist is providing close team supervision and 24 hour management of active medical problems listed below. Physiatrist and rehab team continue to assess barriers to discharge/monitor patient progress toward functional and medical goals. FIM: Function - Bathing Position: Shower Body parts bathed by patient: Right arm, Left arm, Chest, Abdomen, Front perineal area, Buttocks, Right upper leg, Left upper leg, Right lower leg, Left lower leg, Back Body parts bathed by helper: Back Assist Level: Touching or steadying assistance(Pt > 75%)  Function- Upper Body Dressing/Undressing What is the patient wearing?: Pull over shirt/dress Pull over shirt/dress - Perfomed by patient: Put head through opening Pull over shirt/dress - Perfomed by helper: Pull shirt over trunk, Thread/unthread left sleeve, Thread/unthread right  sleeve Assist Level: Touching or steadying assistance(Pt > 75%) Function - Lower Body Dressing/Undressing What is the patient wearing?: Underwear, Pants, Socks, Shoes Position: Wheelchair/chair at [employer] - Performed by patient: Thread/unthread right underwear leg, Thread/unthread left underwear leg, Pull underwear up/down Underwear - Performed by helper: Pull underwear up/down, Thread/unthread left underwear leg, Thread/unthread right underwear leg Pants- Performed by patient: Thread/unthread right pants leg, Thread/unthread left pants leg, Pull pants up/down Pants- Performed by helper: Fasten/unfasten pants Non-skid slipper socks- Performed by patient: Don/doff right sock, Don/doff left sock Non-skid slipper socks- Performed by helper: Don/doff right sock, Don/doff left sock Socks - Performed by patient: Don/doff right sock, Don/doff left sock Shoes - Performed by patient: Don/doff right shoe, Don/doff left shoe Shoes - Performed by helper: Don/doff left shoe Assist for footwear: Supervision/touching assist Assist for lower body dressing: Touching or steadying assistance (Pt > 75%)  Function - Toileting Toileting activity did not occur: N/A Toileting steps completed by patient: Adjust clothing prior to toileting, Performs perineal hygiene, Adjust clothing after toileting Toileting steps completed by helper: Adjust clothing after toileting Toileting Assistive Devices: Grab bar or rail Assist level: Touching or steadying assistance (Pt.75%)  Function - Air cabin crew transfer assistive device: Grab bar Assist level to toilet: Touching or steadying assistance (Pt > 75%) Assist level from toilet: Moderate assist (Pt 50 - 74%/lift or lower)  Function - Chair/bed transfer Chair/bed transfer method: Ambulatory Chair/bed transfer assist level: Touching or steadying assistance (Pt > 75%) Chair/bed transfer assistive device: Armrests Chair/bed transfer details: Tactile cues  for weight shifting, Tactile cues for posture, Tactile cues for placement, Verbal cues for technique, Manual facilitation for weight shifting  Function - Locomotion: Wheelchair Will patient use wheelchair at  discharge?: Yes Type: Manual Max wheelchair distance: 150 Assist Level: Supervision or verbal cues Assist Level: Supervision or verbal cues Wheel 150 feet activity did not occur: Safety/medical concerns Assist Level: Supervision or verbal cues Turns around,maneuvers to table,bed, and toilet,negotiates 3% grade,maneuvers on rugs and over doorsills: No Function - Locomotion: Ambulation Assistive device: Hand held assist Max distance: 150 Assist level: Touching or steadying assistance (Pt > 75%) Walk 10 feet activity did not occur: Safety/medical concerns Assist level: Touching or steadying assistance (Pt > 75%) Walk 50 feet with 2 turns activity did not occur: Safety/medical concerns Assist level: Touching or steadying assistance (Pt > 75%) Walk 150 feet activity did not occur: Safety/medical concerns Assist level: Touching or steadying assistance (Pt > 75%) Walk 10 feet on uneven surfaces activity did not occur: Safety/medical concerns  Function - Comprehension Comprehension: Auditory Comprehension assist level: Understands complex 90% of the time/cues 10% of the time  Function - Expression Expression: Verbal Expression assist level: Expresses complex ideas: With no assist  Function - Social Interaction Social Interaction assist level: Interacts appropriately with others with medication or extra time (anti-anxiety, antidepressant).  Function - Problem Solving Problem solving assist level: Solves basic 75 - 89% of the time/requires cueing 10 - 24% of the time  Function - Memory Memory assist level: Recognizes or recalls 90% of the time/requires cueing < 10% of the time Patient normally able to recall (first 3 days only): That he or she is in a hospital   Medical Problem  List and Plan: 1.  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  DVT Prophylaxis/Anticoagulation: SCDs Monitor for any signs of DVT 3. Pain Management: Tylenol as needed 4. Hypertension. In good range Filed Vitals:   05/25/16 1400 05/26/16 0547  BP: 144/69 134/67  Pulse: 85 61  Temp: 97.8 F (36.6 C) 98.2 F (36.8 C)  Resp: 18 17   5. Neuropsych: This patient is capable of making decisions on his own behalf. 6. Skin/Wound Care: Routine skin checks, probable calcified hematoma 7. Fluids/Electrolytes/Nutrition: Routine I&O's with follow-up chemistries, 75-100% meals,improved,  8. Hyperlipidemia. Lipitor 9. Tobacco abuse. Nicoderm patch. Provided counseling. 10. Hypokalemia: Cont to monitor and replete as necessary.will increase KCL supplementation BMET- will repeat today    Component Value Date/Time   NA 136 05/17/2016 0539   K 3.3* 05/17/2016 0539   CL 103 05/17/2016 0539   CO2 25 05/17/2016 0539   GLUCOSE 101* 05/17/2016 0539   BUN 15 05/17/2016 0539   CREATININE 0.95 05/17/2016 0539   CALCIUM 8.5* 05/17/2016 0539   GFRNONAA >60 05/17/2016 0539   GFRAA >60 05/17/2016 0539      12. Insomnia- trazodone  LOS (Days) 10 A FACE TO FACE EVALUATION WAS PERFORMED  Malyiah Fellows E 05/26/2016, 6:47 AM

## 2016-05-27 ENCOUNTER — Inpatient Hospital Stay (HOSPITAL_COMMUNITY): Payer: 59 | Admitting: Physical Therapy

## 2016-05-27 NOTE — Progress Notes (Signed)
Physical Therapy Session Note  Patient Details  Name: Johnathan [name] MRN: [id number] Date of Birth: 04/01/1951  Today's Date: 05/27/2016 PT Individual Time: 1105-1205 PT Individual Time Calculation (min): 60 min   Short Term Goals: Week 1:  PT Short Term Goal 1 (Week 1): Patient will perform bed mobility with min A from PT  PT Short Term Goal 1 - Progress (Week 1): Met PT Short Term Goal 2 (Week 1): Patient will performed stand pivot transfer with mod A  PT Short Term Goal 2 - Progress (Week 1): Met PT Short Term Goal 3 (Week 1): Patient will ambulate for 25 ft with max A and LRAD  PT Short Term Goal 3 - Progress (Week 1): Met PT Short Term Goal 4 (Week 1): Patient will propel WC for 150ft with supervision A  PT Short Term Goal 4 - Progress (Week 1): Met Week 2:  PT Short Term Goal 1 (Week 2): = LTGs due to LOS  Skilled Therapeutic Interventions/Progress Updates:    Pt frustrated in session by lack of mobility throughout day (becuase it would be dangerous to do so). Pt also limited by BLE pain and L inattention. Inisight training initiated in session to address. Pt would benefit from family training with wife to increase upright activity out of therapy hours, asking nurses to walk if they are available, and initiating constraint induced therapy for LUE forced use. Pt would continue to benefit from skilled PT services to increase functional mobility.  Therapy Documentation Precautions:  Precautions Precautions: Fall Precaution Comments: L sided inattention, apraxia Restrictions Weight Bearing Restrictions: No Pain: Pain Assessment Pain Assessment: 0-10 Pain Score: 8  Pain Type: Acute pain Pain Location: Leg Pain Orientation: Right;Left Pain Descriptors / Indicators: Aching;Other (Comment) (pulling) Pain Frequency: Rarely Pain Onset: Sudden Pain Intervention(s): Medication (See eMAR) Mobility:  SBA transfers with cues for safety and attention Locomotion :   CGA gait without  AD 150'x5 Other Treatments:  Pt educated on rehab plan, safety in mobility, progressing mobility, L inattention, and insight training. Pt performs ball toss side to side. Pt performs dynamic gait holding cuff weight weight, weighted ball reach to L, holding step, and holding chair. Pt performs negotiation in room with cues for L sided attention. Pt lifts objects from floor. Pt performs static standing 5' and 1'x2.  See Function Navigator for Current Functional Status.   Therapy/Group: Individual Therapy  Kinney, Steven G 05/27/2016, 11:15 AM  

## 2016-05-27 NOTE — Progress Notes (Signed)
Subjective/Complaints:  Sleeping ok, no new problems Review of Systems - Negative except insomnia, denies CP/SOB/ bowel or bladder issues  Objective: Vital Signs: Blood pressure 142/75, pulse 62, temperature 97.7 F (36.5 C), temperature source Oral, resp. rate 18, height 5' 6"  (1.676 m), weight 74.2 kg (163 lb 9.3 oz), SpO2 98 %. No results found. Results for orders placed or performed during the hospital encounter of 05/16/16 (from the past 72 hour(s))  Basic metabolic panel     Status: None   Collection Time: 05/26/16  7:40 AM  Result Value Ref Range   Sodium 136 135 - 145 mmol/L   Potassium 4.1 3.5 - 5.1 mmol/L   Chloride 102 101 - 111 mmol/L   CO2 27 22 - 32 mmol/L   Glucose, Bld 97 65 - 99 mg/dL   BUN 16 6 - 20 mg/dL   Creatinine, Ser 0.94 0.61 - 1.24 mg/dL   Calcium 9.2 8.9 - 10.3 mg/dL   GFR calc non Af Amer >60 >60 mL/min   GFR calc Af Amer >60 >60 mL/min    Comment: (NOTE) The eGFR has been calculated using the CKD EPI equation. This calculation has not been validated in all clinical situations. eGFR's persistently <60 mL/min signify possible Chronic Kidney Disease.    Anion gap 7 5 - 15     HEENT: normal Cardio: RRR and no murmur Resp: CTA B/L and unlabored GI: BS positive and NT, ND Extremity:  No Edema Skin:   Bruise left forearm, left pectoral with small firm 1cm mass in area of bruising Neuro: Alert/Oriented, Abnormal Sensory absent LT, LUE and LLE, Abnormal Motor 3- LUE, 4- LLE, Abnormal FMC Ataxic/ dec FMC and Inattention, able to do finger to thumb with eyes closed Musc/Skel:  Normal Gen NAD   Assessment/Plan: 1. 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 Physiatrist is providing close team supervision and 24 hour management of active medical problems listed below. Physiatrist  and rehab team continue to assess barriers to discharge/monitor patient progress toward functional and medical goals. FIM: Function - Bathing Position: Shower Body parts bathed by patient: Right arm, Left arm, Chest, Abdomen, Front perineal area, Buttocks, Right upper leg, Left upper leg, Right lower leg, Left lower leg, Back Body parts bathed by helper: Back Assist Level: Touching or steadying assistance(Pt > 75%)  Function- Upper Body Dressing/Undressing What is the patient wearing?: Pull over shirt/dress Pull over shirt/dress - Perfomed by patient: Put head through opening Pull over shirt/dress - Perfomed by helper: Pull shirt over trunk, Thread/unthread left sleeve, Thread/unthread right sleeve Assist Level: Touching or steadying assistance(Pt > 75%) Function - Lower Body Dressing/Undressing What is the patient wearing?: Underwear, Pants, Socks, Shoes Position: Wheelchair/chair at [employer] - Performed by patient: Thread/unthread right underwear leg, Thread/unthread left underwear leg, Pull underwear up/down Underwear - Performed by helper: Pull underwear up/down, Thread/unthread left underwear leg, Thread/unthread right underwear leg Pants- Performed by patient: Thread/unthread right pants leg, Thread/unthread left pants leg, Pull pants up/down Pants- Performed by helper: Fasten/unfasten pants Non-skid slipper socks- Performed by patient: Don/doff right sock, Don/doff left sock Non-skid slipper socks- Performed by helper: Don/doff right sock, Don/doff left sock Socks - Performed by patient: Don/doff right sock, Don/doff left sock Shoes - Performed by patient: Don/doff right shoe, Don/doff left shoe Shoes - Performed by helper: Don/doff left shoe Assist for footwear: Supervision/touching assist  Assist for lower body dressing: Touching or steadying assistance (Pt > 75%)  Function - Toileting Toileting activity did not occur: N/A Toileting steps completed by patient: Adjust  clothing prior to toileting, Performs perineal hygiene, Adjust clothing after toileting Toileting steps completed by helper: Adjust clothing after toileting Toileting Assistive Devices: Grab bar or rail Assist level: Touching or steadying assistance (Pt.75%)  Function - Toilet Transfers Toilet transfer assistive device: Grab bar Assist level to toilet: Touching or steadying assistance (Pt > 75%) Assist level from toilet: Moderate assist (Pt 50 - 74%/lift or lower)  Function - Chair/bed transfer Chair/bed transfer method: Ambulatory Chair/bed transfer assist level: Touching or steadying assistance (Pt > 75%) Chair/bed transfer assistive device: Armrests Chair/bed transfer details: Tactile cues for weight shifting, Tactile cues for posture, Tactile cues for placement, Verbal cues for technique, Manual facilitation for weight shifting  Function - Locomotion: Wheelchair Will patient use wheelchair at discharge?: Yes Type: Manual Max wheelchair distance: 150 Assist Level: Supervision or verbal cues Assist Level: Supervision or verbal cues Wheel 150 feet activity did not occur: Safety/medical concerns Assist Level: Supervision or verbal cues Turns around,maneuvers to table,bed, and toilet,negotiates 3% grade,maneuvers on rugs and over doorsills: No Function - Locomotion: Ambulation Assistive device: Hand held assist, Walker-rolling Max distance: 150 Assist level: Touching or steadying assistance (Pt > 75%) Walk 10 feet activity did not occur: Safety/medical concerns Assist level: Touching or steadying assistance (Pt > 75%) Walk 50 feet with 2 turns activity did not occur: Safety/medical concerns Assist level: Touching or steadying assistance (Pt > 75%) Walk 150 feet activity did not occur: Safety/medical concerns Assist level: Touching or steadying assistance (Pt > 75%) Walk 10 feet on uneven surfaces activity did not occur: Safety/medical concerns  Function -  Comprehension Comprehension: Auditory Comprehension assist level: Understands complex 90% of the time/cues 10% of the time  Function - Expression Expression: Verbal Expression assist level: Expresses complex ideas: With no assist  Function - Social Interaction Social Interaction assist level: Interacts appropriately with others with medication or extra time (anti-anxiety, antidepressant).  Function - Problem Solving Problem solving assist level: Solves basic 90% of the time/requires cueing < 10% of the time  Function - Memory Memory assist level: Recognizes or recalls 90% of the time/requires cueing < 10% of the time Patient normally able to recall (first 3 days only): That he or she is in a hospital   Medical Problem List and Plan: 1.  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  DVT Prophylaxis/Anticoagulation: SCDs Monitor for any signs of DVT 3. Pain Management: Tylenol as needed 4. Hypertension. In good range Filed Vitals:   05/26/16 1423 05/27/16 0601  BP: 153/74 142/75  Pulse: 80 62  Temp: 97.9 F (36.6 C) 97.7 F (36.5 C)  Resp: 18 18   5. Neuropsych: This patient is capable of making decisions on his own behalf. 6. Skin/Wound Care: Routine skin checks, probable calcified hematoma 7. Fluids/Electrolytes/Nutrition: Routine I&O's with follow-up chemistries, 75-100% meals,improved,  8. Hyperlipidemia. Lipitor 9. Tobacco abuse. Nicoderm patch. Provided counseling. 10. Hypokalemia: resolved on KCL supplementation     Component Value Date/Time   NA 136 05/26/2016 0740   K 4.1 05/26/2016 0740   CL 102 05/26/2016 0740   CO2 27 05/26/2016 0740   GLUCOSE 97 05/26/2016 0740   BUN 16 05/26/2016 0740   CREATININE 0.94 05/26/2016 0740   CALCIUM 9.2 05/26/2016 0740   GFRNONAA >60 05/26/2016 0740  GFRAA >60 05/26/2016 0740      12. Insomnia- trazodone  LOS (Days) 11 A FACE TO FACE EVALUATION  WAS PERFORMED  Kolleen Ochsner E 05/27/2016, 7:18 AM

## 2016-05-28 ENCOUNTER — Other Ambulatory Visit: Payer: Self-pay | Admitting: *Deleted

## 2016-05-28 ENCOUNTER — Inpatient Hospital Stay (HOSPITAL_COMMUNITY): Payer: 59 | Admitting: Occupational Therapy

## 2016-05-28 ENCOUNTER — Inpatient Hospital Stay (HOSPITAL_COMMUNITY): Payer: 59

## 2016-05-28 NOTE — Progress Notes (Signed)
Subjective/Complaints:  Practicing moving small sponges Review of Systems - Negative except insomnia, denies CP/SOB/ bowel or bladder issues  Objective: Vital Signs: Blood pressure 140/81, pulse 64, temperature 98.1 F (36.7 C), temperature source Oral, resp. rate 17, height 5' 6"  (1.676 m), weight 74.2 kg (163 lb 9.3 oz), SpO2 98 %. No results found. Results for orders placed or performed during the hospital encounter of 05/16/16 (from the past 72 hour(s))  Basic metabolic panel     Status: None   Collection Time: 05/26/16  7:40 AM  Result Value Ref Range   Sodium 136 135 - 145 mmol/L   Potassium 4.1 3.5 - 5.1 mmol/L   Chloride 102 101 - 111 mmol/L   CO2 27 22 - 32 mmol/L   Glucose, Bld 97 65 - 99 mg/dL   BUN 16 6 - 20 mg/dL   Creatinine, Ser 0.94 0.61 - 1.24 mg/dL   Calcium 9.2 8.9 - 10.3 mg/dL   GFR calc non Af Amer >60 >60 mL/min   GFR calc Af Amer >60 >60 mL/min    Comment: (NOTE) The eGFR has been calculated using the CKD EPI equation. This calculation has not been validated in all clinical situations. eGFR's persistently <60 mL/min signify possible Chronic Kidney Disease.    Anion gap 7 5 - 15     HEENT: normal Cardio: RRR and no murmur Resp: CTA B/L and unlabored GI: BS positive and NT, ND Extremity:  No Edema Skin:   Bruise left forearm, left pectoral with small firm 1cm mass in area of bruising Neuro: Alert/Oriented, Abnormal Sensory absent LT, LUE and LLE, Abnormal Motor 3- LUE, 4- LLE, Abnormal FMC Ataxic/ dec FMC and Inattention, able to do finger to thumb with eyes closed Musc/Skel:  Normal Gen NAD   Assessment/Plan: 1. 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 Physiatrist is providing close team supervision and 24 hour management of active medical problems listed  below. Physiatrist and rehab team continue to assess barriers to discharge/monitor patient progress toward functional and medical goals. FIM: Function - Bathing Position: Shower Body parts bathed by patient: Right arm, Left arm, Chest, Abdomen, Front perineal area, Buttocks, Right upper leg, Left upper leg, Right lower leg, Left lower leg, Back Body parts bathed by helper: Back Assist Level: Touching or steadying assistance(Pt > 75%)  Function- Upper Body Dressing/Undressing What is the patient wearing?: Pull over shirt/dress Pull over shirt/dress - Perfomed by patient: Put head through opening Pull over shirt/dress - Perfomed by helper: Pull shirt over trunk, Thread/unthread left sleeve, Thread/unthread right sleeve Assist Level: Touching or steadying assistance(Pt > 75%) Function - Lower Body Dressing/Undressing What is the patient wearing?: Underwear, Pants, Socks, Shoes Position: Wheelchair/chair at [employer] - Performed by patient: Thread/unthread right underwear leg, Thread/unthread left underwear leg, Pull underwear up/down Underwear - Performed by helper: Pull underwear up/down, Thread/unthread left underwear leg, Thread/unthread right underwear leg Pants- Performed by patient: Thread/unthread right pants leg, Thread/unthread left pants leg, Pull pants up/down Pants- Performed by helper: Fasten/unfasten pants Non-skid slipper socks- Performed by patient: Don/doff right sock, Don/doff left sock Non-skid slipper socks- Performed by helper: Don/doff right sock, Don/doff left sock Socks - Performed by patient: Don/doff right sock, Don/doff left sock Shoes - Performed by patient: Don/doff right shoe, Don/doff left shoe Shoes - Performed by helper: Don/doff left shoe Assist for footwear: Supervision/touching assist Assist  for lower body dressing: Touching or steadying assistance (Pt > 75%)  Function - Toileting Toileting activity did not occur: N/A Toileting steps completed by  patient: Adjust clothing prior to toileting, Performs perineal hygiene, Adjust clothing after toileting Toileting steps completed by helper: Adjust clothing after toileting Toileting Assistive Devices: Grab bar or rail Assist level: Touching or steadying assistance (Pt.75%)  Function - Toilet Transfers Toilet transfer assistive device: Grab bar Assist level to toilet: Touching or steadying assistance (Pt > 75%) Assist level from toilet: Touching or steadying assistance (Pt > 75%)  Function - Chair/bed transfer Chair/bed transfer method: Ambulatory Chair/bed transfer assist level: Touching or steadying assistance (Pt > 75%) Chair/bed transfer assistive device: Armrests Chair/bed transfer details: Tactile cues for weight shifting, Tactile cues for posture, Tactile cues for placement, Verbal cues for technique, Manual facilitation for weight shifting  Function - Locomotion: Wheelchair Will patient use wheelchair at discharge?: Yes Type: Manual Max wheelchair distance: 150 Assist Level: Supervision or verbal cues Assist Level: Supervision or verbal cues Wheel 150 feet activity did not occur: Safety/medical concerns Assist Level: Supervision or verbal cues Turns around,maneuvers to table,bed, and toilet,negotiates 3% grade,maneuvers on rugs and over doorsills: No Function - Locomotion: Ambulation Assistive device: No device Max distance: 150 Assist level: Touching or steadying assistance (Pt > 75%) Walk 10 feet activity did not occur: Safety/medical concerns Assist level: Touching or steadying assistance (Pt > 75%) Walk 50 feet with 2 turns activity did not occur: Safety/medical concerns Assist level: Touching or steadying assistance (Pt > 75%) Walk 150 feet activity did not occur: Safety/medical concerns Assist level: Touching or steadying assistance (Pt > 75%) Walk 10 feet on uneven surfaces activity did not occur: Safety/medical concerns  Function - Comprehension Comprehension:  Auditory Comprehension assist level: Understands complex 90% of the time/cues 10% of the time  Function - Expression Expression: Verbal Expression assist level: Expresses complex ideas: With no assist  Function - Social Interaction Social Interaction assist level: Interacts appropriately with others with medication or extra time (anti-anxiety, antidepressant).  Function - Problem Solving Problem solving assist level: Solves basic 90% of the time/requires cueing < 10% of the time  Function - Memory Memory assist level: Recognizes or recalls 90% of the time/requires cueing < 10% of the time Patient normally able to recall (first 3 days only): That he or she is in a hospital   Medical Problem List and Plan: 1.  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  DVT Prophylaxis/Anticoagulation: SCDs Monitor for any signs of DVT 3. Pain Management: Tylenol as needed 4. Hypertension. In good range Filed Vitals:   05/27/16 1257 05/28/16 0547  BP: 133/71 140/81  Pulse: 73 64  Temp: 97.9 F (36.6 C) 98.1 F (36.7 C)  Resp: 16 17   5. Neuropsych: This patient is capable of making decisions on his own behalf. 6. Skin/Wound Care: Routine skin checks, probable calcified hematoma 7. Fluids/Electrolytes/Nutrition: Routine I&O's with follow-up chemistries, 75-100% meals,improved,  8. Hyperlipidemia. Lipitor 9. Tobacco abuse. Nicoderm patch. Provided counseling. 10. Hypokalemia: resolved on KCL supplementation, will d/c an recheck in 2-3 d     Component Value Date/Time   NA 136 05/26/2016 0740   K 4.1 05/26/2016 0740   CL 102 05/26/2016 0740   CO2 27 05/26/2016 0740   GLUCOSE 97 05/26/2016 0740   BUN 16 05/26/2016 0740   CREATININE 0.94 05/26/2016 0740  CALCIUM 9.2 05/26/2016 0740   GFRNONAA >60 05/26/2016 0740   GFRAA >60 05/26/2016 0740      12. Insomnia-  trazodone  LOS (Days) 12 A FACE TO FACE EVALUATION WAS PERFORMED  Field Staniszewski E 05/28/2016, 6:47 AM

## 2016-05-28 NOTE — Patient Outreach (Signed)
[facility]) Care Management  05/28/2016  GREENE VIALE [birth year] [id number]  Patient currently inpatient rehab at [facility].    [name] reviewed chart on 05/23/16 and 05/28/16.    Objective: Per chart review:  Patient hospitalized 05/12/16 - 05/16/16 for Cerebrovascular accident (CVA) due to thrombosis of right carotid artery.   Patient currently inpatient rehab at [facility] since 05/16/16.    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   Patient for tentative discharge from inpatient rehab on 06/01/16.    Assessment:  Received [facility] Transition of care referral on 05/21/16.   Transition of care follow up pending, discharge to home, and patient contact.  Plan:  [name] will verify patient's inpatient status and call patient within 2 weeks if discharged from rehab to home, for transition of care follow up.    Carsen Machi H. Annia Friendly, BSN, [facility] Telephonic CM Phone: [phone] Fax: [phone]

## 2016-05-28 NOTE — Progress Notes (Signed)
Occupational Therapy Session Note  Patient Details  Name: Johnathan [name] MRN: [id number] Date of Birth: [birth year]  Today's Date: 05/28/2016 OT Individual Time:  - 7096-2836  (45 min) 1st session\                                        1345-1500  (75 min):   2nd session      Short Term Goals: Week 1:  OT Short Term Goal 1 (Week 1): Pt will complete UB bathing with supervision sitting unsupported. OT Short Term Goal 1 - Progress (Week 1): Met OT Short Term Goal 2 (Week 1): Pt will perform LB bathing sit to stand with min assist. OT Short Term Goal 2 - Progress (Week 1): Met OT Short Term Goal 3 (Week 1): Pt will perform stand pivot transfers to the shower with min assist. OT Short Term Goal 3 - Progress (Week 1): Met OT Short Term Goal 4 (Week 1): Pt will complete LB dressing with mod assist sit to stand.  OT Short Term Goal 4 - Progress (Week 1): Met OT Short Term Goal 5 (Week 1): Pt will use the LUE as a gross assist for selfcare tasks with supervision.   OT Short Term Goal 5 - Progress (Week 1): Met Week 2:  OT Short Term Goal 1 (Week 2): Pt will complete UB/LB bathing wiht supervision/setup  OT Short Term Goal 2 (Week 2): Pt will maintain midline orientation while bathing on tub bench 90% of the time with mod cuing OT Short Term Goal 3 (Week 2): Pt will complete UB/LB dressing with supervision/setup OT Short Term Goal 4 (Week 2): Pt will complete toileting transfer and tasks with supervision/setup OT Short Term Goal 5 (Week 2): Family education will be completed regarding skilled OT d/c plan Week 3:     Skilled Therapeutic Interventions/Progress Updates:    1st session:  Skilled OT intervention for LUE NMRe, functional mobility, balance in standing, decrease compensatory movements. Pt ambulated with RW to walk in shower.  Used grab bars for transfer to bench despite educating on using the RWsince no grab bars at home.  Pt stated he was get some installed.  P needed increased time for  donning shirt and had trouble with body awareness, and visual scannig to left to see the whole picture.  Pt needed cues for eccentrc control with stand to sit.   Ambulated to recliner and left with all needs in reach.    2nd session:  Ambulated with RW to cym.  Addressed LUE NMRE. DID hand translation with elbow extension and supination and external rotations.   Educated pt on importance of activation of extensor muscles . Practice  Sit to stand with hands on knees and stand to sit as well; finger tapping. Provided instructions and exercises to add to pt's daily routine as follows: Shoulder flexion with elbow and wrist extension and finger extension; Object passing from one table to another with crossing midline and trunk rotation, finger tapping with holding extension for 10 seconds   Holding stick between thumb and index finger; and translating to next 2 fingers.  Pt t verbalized understanding.   Pt returned demonstration and set up routine in room. Pt left in room with safety belt in place, call button in reach,  Pt encouraged to continue FM and gross motor coordination exercises that were shown earlier in the week. Therapy Documentation  Precautions:  Precautions Precautions: Fall Precaution Comments: L sided inattention, apraxia Restrictions Weight Bearing Restrictions: No   Vital Signs: Therapy Vitals Temp: 98.1 F (36.7 C) Temp Source: Oral Pulse Rate: 64 Resp: 17 BP: 140/81 mmHg Patient Position (if appropriate): Lying Oxygen Therapy SpO2: 98 % Pain:  none          See Function Navigator for Current Functional Status.   Therapy/Group: Individual Therapy  Lisa Roca 05/28/2016, 7:49 AM

## 2016-05-28 NOTE — Progress Notes (Signed)
Recreational Therapy Session Note  Patient Details  Name: Johnathan [name] MRN: [id number] Date of Birth: 08/19/1951 Today's Date: 05/28/2016  Pt participated in animal assisted activity/therapy seated with supervision.  [place] 05/28/2016, 3:49 PM

## 2016-05-28 NOTE — Progress Notes (Addendum)
Physical Therapy Note  Patient Details  Name: Johnathan [name] MRN: [id number] Date of Birth: 05-02-1951 Today's Date: 05/28/2016  [id number], 60 min individual Pain: none reported  neuromuscular re-education via visual feedback, demo, VCs, forced use for w/c propulsion (cushion removed) using bil LEs with max cues to keep L foot progressing; seated coordination ex for alternating reciprocal movements bil LE: marching, long arc quad knee ext with ankle pumps at end range, heel raises, feet sliding forward/backward. Coordination improved with many reps.  Gait with Rw on level tile x 150' with 2 L turns, min assist.  1 LOB as he backed up to sit in w/c, requiring assistance to regain balance.  Up/down curb/step x 2 with RW, min assist, max cues for safe use or RW and attention to LLE. PT educated pt on fall recovery.  Floor transfer x 2 with mod assist, max cues after demo, for attention to LLE. Pt demonstrates L inattention especially during floor transfers, as he must align LEs with trunk to descend and ascend mat table >< floor mat.  For sit>< stand technique today,  after scooting to edge of surface, pt placed bil hands on knees and pushed bil.  Max cues needed for this as pt continues to ignore L hand.   Pt left resting in recliner with quick release belt applied and all needs within reach. Pt reported his wife has ordered supplies for a ramp and they have a person ready to build ramp as soon as they are in.  Some LTGs upgraded due to pt's improved balance and safer speed of movement.  He will likely still need assistance for entry into home if ramp is not finished.   Der Gagliano 05/28/2016, 7:53 AM

## 2016-05-29 ENCOUNTER — Inpatient Hospital Stay (HOSPITAL_COMMUNITY): Payer: 59 | Admitting: Physical Therapy

## 2016-05-29 ENCOUNTER — Inpatient Hospital Stay (HOSPITAL_COMMUNITY): Payer: 59

## 2016-05-29 ENCOUNTER — Inpatient Hospital Stay (HOSPITAL_COMMUNITY): Payer: 59 | Admitting: Occupational Therapy

## 2016-05-29 NOTE — Progress Notes (Signed)
Physical Therapy Session Note  Patient Details  Name: Johnathan [name] MRN: [id number] Date of Birth: 1951/10/31  Today's Date: 05/29/2016 PT Individual Time: 0904-1004 PT Individual Time Calculation (min): 60 min   Short Term Goals: Week 2:  PT Short Term Goal 1 (Week 2): = LTGs due to LOS  Skilled Therapeutic Interventions/Progress Updates:    Patient up in chair in room, performs sit to stand supervision to minguard and ambulated to therapy gym 170' with RW and S to minguard A.  Noted in sitting L trunk elongation and ribs flared, posterior pelvic tilt and L lateral weight shift.  Seated trunk work for lateral weight shifts, anterior pelvic tilt and awareness of L trunk activation on mat, on sit disc and eventually on therapy ball with mirror for feedback.  Worked in L sidelying with mod support for L shoulder, trunk and pelvic activation/weight shift with overhead reaching with R UE.  Standing lateral weight shifts with mod facilitation for L trunk use.  Patient ambulated without device 100' with facilitation through ribs for weight shifts and trunk activation.  In parallel bars on rockerboard for lateral weight shifts with mirror and mod facilitation for keeping head and shoulders in middle and working trunk with bilateral UE support.  Patient ambulated back to room with RW and close guard A, verbal cues for safety with turns.  Left in w/c with quick release belt and access to all needs.   Therapy Documentation Precautions:  Precautions Precautions: Fall Precaution Comments: L sided inattention, apraxia Restrictions Weight Bearing Restrictions: No Pain: Pain Assessment Pain Assessment: No/denies pain    Other Treatments: Treatments Neuromuscular Facilitation: Forced use;Activity to increase lateral weight shifting;Activity to increase anterior-posterior weight shifting;Activity to increase sustained activation;Left;Upper Extremity;Activity to increase motor control;Activity to increase  timing and sequencing   See Function Navigator for Current Functional Status.   Therapy/Group: Individual Therapy  [name]  [place], [place] 05/29/2016  05/29/2016, 10:58 AM

## 2016-05-29 NOTE — Progress Notes (Signed)
Subjective/Complaints: No issues overnite Review of Systems - Negative except insomnia, denies CP/SOB/ bowel or bladder issues  Objective: Vital Signs: Blood pressure 137/72, pulse 72, temperature 98 F (36.7 C), temperature source Oral, resp. rate 18, height 5' 6"  (1.676 m), weight 74.2 kg (163 lb 9.3 oz), SpO2 96 %. No results found. Results for orders placed or performed during the hospital encounter of 05/16/16 (from the past 72 hour(s))  Basic metabolic panel     Status: None   Collection Time: 05/26/16  7:40 AM  Result Value Ref Range   Sodium 136 135 - 145 mmol/L   Potassium 4.1 3.5 - 5.1 mmol/L   Chloride 102 101 - 111 mmol/L   CO2 27 22 - 32 mmol/L   Glucose, Bld 97 65 - 99 mg/dL   BUN 16 6 - 20 mg/dL   Creatinine, Ser 0.94 0.61 - 1.24 mg/dL   Calcium 9.2 8.9 - 10.3 mg/dL   GFR calc non Af Amer >60 >60 mL/min   GFR calc Af Amer >60 >60 mL/min    Comment: (NOTE) The eGFR has been calculated using the CKD EPI equation. This calculation has not been validated in all clinical situations. eGFR's persistently <60 mL/min signify possible Chronic Kidney Disease.    Anion gap 7 5 - 15     HEENT: normal Cardio: RRR and no murmur Resp: CTA B/L and unlabored GI: BS positive and NT, ND Extremity:  No Edema Skin:   Bruise left forearm, left pectoral with small firm 1cm mass in area of bruising Neuro: Alert/Oriented, Abnormal Sensory absent LT, LUE and LLE, Abnormal Motor 3- LUE, 4- LLE, Abnormal FMC Ataxic/ dec FMC and Inattention, able to do finger to thumb with eyes closed Musc/Skel:  Normal Gen NAD   Assessment/Plan: 1. 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 Physiatrist is providing close team supervision and 24 hour management of active medical problems listed below. Physiatrist and rehab team  continue to assess barriers to discharge/monitor patient progress toward functional and medical goals. FIM: Function - Bathing Position: Shower Body parts bathed by patient: Right arm, Left arm, Chest, Abdomen, Front perineal area, Buttocks, Right upper leg, Left upper leg, Right lower leg, Left lower leg, Back Body parts bathed by helper: Back Assist Level: Supervision or verbal cues  Function- Upper Body Dressing/Undressing What is the patient wearing?: Pull over shirt/dress Pull over shirt/dress - Perfomed by patient: Thread/unthread right sleeve, Pull shirt over trunk Pull over shirt/dress - Perfomed by helper: Thread/unthread left sleeve, Put head through opening Assist Level: Touching or steadying assistance(Pt > 75%) Function - Lower Body Dressing/Undressing What is the patient wearing?: Underwear, Pants, Socks, Shoes Position: Other (comment) (recliner) Underwear - Performed by patient: Thread/unthread right underwear leg, Thread/unthread left underwear leg, Pull underwear up/down Underwear - Performed by helper: Pull underwear up/down, Thread/unthread left underwear leg, Thread/unthread right underwear leg Pants- Performed by patient: Thread/unthread right pants leg, Thread/unthread left pants leg, Pull pants up/down Pants- Performed by helper: Fasten/unfasten pants Non-skid slipper socks- Performed by patient: Don/doff right sock, Don/doff left sock Non-skid slipper socks- Performed by helper: Don/doff right sock, Don/doff left sock Socks - Performed by patient: Don/doff right sock, Don/doff left sock Shoes - Performed by patient: Don/doff right shoe, Don/doff left shoe Shoes - Performed by helper: Don/doff left shoe Assist for footwear: Supervision/touching assist Assist for lower body dressing:  Touching or steadying assistance (Pt > 75%)  Function - Toileting Toileting activity did not occur: N/A Toileting steps completed by patient: Adjust clothing prior to toileting,  Performs perineal hygiene, Adjust clothing after toileting Toileting steps completed by helper: Adjust clothing after toileting Toileting Assistive Devices: Grab bar or rail Assist level: Touching or steadying assistance (Pt.75%)  Function - Toilet Transfers Toilet transfer assistive device: Grab bar Assist level to toilet: Touching or steadying assistance (Pt > 75%) Assist level from toilet: Touching or steadying assistance (Pt > 75%)  Function - Chair/bed transfer Chair/bed transfer method: Stand pivot Chair/bed transfer assist level: Touching or steadying assistance (Pt > 75%) Chair/bed transfer assistive device: Armrests Chair/bed transfer details: Manual facilitation for weight shifting, Verbal cues for sequencing, Verbal cues for safe use of DME/AE  Function - Locomotion: Wheelchair Will patient use wheelchair at discharge?: Yes Type: Manual Max wheelchair distance: 150 Assist Level: Supervision or verbal cues Assist Level: Supervision or verbal cues Wheel 150 feet activity did not occur: Safety/medical concerns Assist Level: Supervision or verbal cues Turns around,maneuvers to table,bed, and toilet,negotiates 3% grade,maneuvers on rugs and over doorsills: No Function - Locomotion: Ambulation Assistive device: Walker-rolling Max distance: 150 Assist level: Touching or steadying assistance (Pt > 75%) Walk 10 feet activity did not occur: Safety/medical concerns Assist level: Touching or steadying assistance (Pt > 75%) Walk 50 feet with 2 turns activity did not occur: Safety/medical concerns Assist level: Touching or steadying assistance (Pt > 75%) Walk 150 feet activity did not occur: Safety/medical concerns Assist level: Touching or steadying assistance (Pt > 75%) Walk 10 feet on uneven surfaces activity did not occur: Safety/medical concerns  Function - Comprehension Comprehension: Auditory Comprehension assistive device: Other Comprehension assist level: Understands  complex 90% of the time/cues 10% of the time  Function - Expression Expression: Verbal Expression assist level: Expresses complex ideas: With no assist  Function - Social Interaction Social Interaction assist level: Interacts appropriately with others with medication or extra time (anti-anxiety, antidepressant).  Function - Problem Solving Problem solving assist level: Solves basic 25 - 49% of the time - needs direction more than half the time to initiate, plan or complete simple activities  Function - Memory Memory assist level: Recognizes or recalls 90% of the time/requires cueing < 10% of the time, Recognizes or recalls 25 - 49% of the time/requires cueing 50 - 75% of the time Patient normally able to recall (first 3 days only): That he or she is in a hospital   Medical Problem List and Plan: 1.  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  DVT Prophylaxis/Anticoagulation: SCDs Monitor for any signs of DVT 3. Pain Management: Tylenol as needed 4. Hypertension. In good range Filed Vitals:   05/28/16 1324 05/29/16 0458  BP: 135/88 137/72  Pulse: 87 72  Temp: 97.6 F (36.4 C) 98 F (36.7 C)  Resp: 16 18   5. Neuropsych: This patient is capable of making decisions on his own behalf. 6. Skin/Wound Care: Routine skin checks, probable calcified hematoma 7. Fluids/Electrolytes/Nutrition: Routine I&O's with follow-up chemistries, 75-100% meals,improved,  8. Hyperlipidemia. Lipitor 9. Tobacco abuse. Nicoderm patch. Provided counseling. 10. Hypokalemia: resolved on KCL supplementation, will d/c an recheck in am     Component Value Date/Time   NA 136 05/26/2016 0740   K 4.1 05/26/2016 0740   CL 102 05/26/2016 0740   CO2 27 05/26/2016  0740   GLUCOSE 97 05/26/2016 0740   BUN 16 05/26/2016 0740   CREATININE 0.94 05/26/2016 0740   CALCIUM 9.2 05/26/2016 0740    GFRNONAA >60 05/26/2016 0740   GFRAA >60 05/26/2016 0740      12. Insomnia- trazodone  LOS (Days) 13 A FACE TO FACE EVALUATION WAS PERFORMED  KIRSTEINS,ANDREW E 05/29/2016, 6:47 AM

## 2016-05-29 NOTE — Progress Notes (Signed)
Occupational Therapy Session Note  Patient Details  Name: Johnathan [name] MRN: [id number] Date of Birth: 1951/11/02  Today's Date: 05/30/2016 OT Individual Time:  -  1300-1400 60 minutes       Short Term Goals: Week 2:  OT Short Term Goal 1 (Week 2): Pt will complete UB/LB bathing wiht supervision/setup  OT Short Term Goal 2 (Week 2): Pt will maintain midline orientation while bathing on tub bench 90% of the time with mod cuing OT Short Term Goal 3 (Week 2): Pt will complete UB/LB dressing with supervision/setup OT Short Term Goal 4 (Week 2): Pt will complete toileting transfer and tasks with supervision/setup OT Short Term Goal 5 (Week 2): Family education will be completed regarding skilled OT d/c plan  Skilled Therapeutic Interventions/Progress Updates:    Pt participated in self care mgt retraining in shower and sitting in chair at sink. Pt completed gathering of ADL items w/c level with vcs for integrating L UE into task. Pt still exhibits decreased left sided awareness and L UE coordination. Pt ambulated into bathroom with RW and Min A for steadying assistance on left side. Pt completed shower bench transfer with Min A demonstrating forward leaning posture and required manual cuing for facilitation of upright postural alignment. Simulated threshold created at shower entry to simulate home walk in shower. UB bathing completed with supervision for cuing to incorporate L UE. LB bathing completed in standing with grab bars and steadying assistance. Pt completed UB/LB dressing with supervision in chair, standing as needed at sink. Pt was able to orient tshirt with minimal cuing today and reported feeling great success at donning shirt without physical assistance today.  Pt exhibits impulsivity with transitioning from sit to stand and during functional ambulation. Education provided on installation of grab bars in shower and near toilet, or purchase of BSC, as well as purchase of shower chair due to  pt having walk in shower. Pt continues to benefit from increasing functional use of L UE, as well as increasing L sided awareness for self care completion. Pt left in w/c with QRB and all needs within reach at end of session.     Therapy Documentation Precautions:  Precautions Precautions: Fall Precaution Comments: L sided inattention, apraxia Restrictions Weight Bearing Restrictions: No General:   Vital Signs:   Pain: Pt reports no pain during tx     See Function Navigator for Current Functional Status.   Therapy/Group: Individual Therapy  Selim Durden A Jenita Rayfield 05/30/2016, 4:54 PM

## 2016-05-29 NOTE — Progress Notes (Signed)
Physical Therapy Note  Patient Details  Name: Johnathan [name] MRN: [id number] Date of Birth: 06-04-1951 Today's Date: 05/29/2016    Time: 730-830 60 minutes  1:1 No c/o pain.  Gait without AD with HHA 3 x 150' with focus on decreasing L LE scissoring, increasing cadence to a more efficient rate.  Side and backward stepping for L hip control with min A for balance especially when fatigued.  Step training to 4'' step without rails as pt states he can enter his home through the sunroom this way, pt able to perform with min HHA.  Obstacle negotiation with gait with min HHA, focus on L attention.  Seated and standing kinetron for LE strengthening and forced use of L LE.  Time 2: 1100-1125 25 minutes  1:1 No c/o pain.  Floor transfer training with supervision cuing for safety and technique, pt improved with repetition, needed no physical assist for floor to mat transfer.  Discussed energy conservation for home and community and safety considerations at home, pt verbalizes understanding.   [name] 05/29/2016, 8:30 AM

## 2016-05-30 ENCOUNTER — Inpatient Hospital Stay (HOSPITAL_COMMUNITY): Payer: 59 | Admitting: *Deleted

## 2016-05-30 ENCOUNTER — Inpatient Hospital Stay (HOSPITAL_COMMUNITY): Payer: 59 | Admitting: Occupational Therapy

## 2016-05-30 LAB — BASIC METABOLIC PANEL
Anion gap: 5 (ref 5–15)
BUN: 15 mg/dL (ref 6–20)
CO2: 28 mmol/L (ref 22–32)
Calcium: 9.2 mg/dL (ref 8.9–10.3)
Chloride: 103 mmol/L (ref 101–111)
Creatinine, Ser: 0.98 mg/dL (ref 0.61–1.24)
GFR calc Af Amer: >60 mL/min (ref >60)
GFR calc non Af Amer: >60 mL/min (ref >60)
Glucose, Bld: 87 mg/dL (ref 65–99)
Potassium: 4.3 mmol/L (ref 3.5–5.1)
Sodium: 136 mmol/L (ref 135–145)

## 2016-05-30 NOTE — Progress Notes (Addendum)
Recreational Therapy Assessment and Plan  Patient Details  Name: Johnathan [name] MRN: [id number] Date of Birth: [birth year] Today's Date: 05/30/2016 Assessment Clinical Impression:  Problem List:  Patient Active Problem List   Diagnosis Date Noted  . Left-sided weakness 05/16/2016  . Right Carotid artery occlusion with infarction ([place]) 05/16/2016  . Essential hypertension 05/16/2016  . Hyperlipidemia LDL goal <70 05/16/2016  . Cigarette smoker 05/16/2016  . Overweight (BMI 25.0-29.9) 05/16/2016  . 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 Acute respiratory failure ([place]) 05/16/2016  . Cerebral infarction involving right middle cerebral artery ([place]) 05/16/2016  . Gait disturbance, post-stroke   . Hypokalemia   . Hypophosphatemia   . Cerebrovascular accident (CVA) due to thrombosis of right carotid artery ([place]) s/p IV tPA, R ICA stent placement, thrombectomy     Past Medical History: No past medical history on file. Past Surgical History:  Past Surgical History  Procedure Laterality Date  . Leg surgery    . Radiology with anesthesia N/A 05/12/2016    Procedure: RADIOLOGY WITH ANESTHESIA; Surgeon: Luanne Bras, MD; Location: [facility]; Service: Radiology; Laterality: N/A;    Assessment & Plan Clinical Impression: Patient is a 65 y.o. right handed male with history of tobacco abuse on no prescription medications. 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 They have a son also of the home who also works. One level home with 3 steps to entry. Presented 05/12/2016 with left-sided weakness. Initial CT scan showed no definite acute findings. MRI of the brain showed extensive acute subacute infarct involving the posterior right MCA territory. Additional involvement of the medial posterior right frontal lobe  along the posterior cingulate gyrus. Remote infarct in the right occipital lobe. 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 Interventional radiology underwent right ICA stent 05/13/2016. Patient remained on ventilator for a short time. Echocardiogram with ejection fraction of 60% no wall motion abnormalities. No cardiac source of emboli identified. Patient did not receive TPA. Neurology services maintain on aspirin and Plavix for CVA prophylaxis. Tolerating a regular consistency diet. Patient transferred to CIR on 05/16/2016.      Pt presents with decreased activity tolerance, decreased functional mobility, decreased balance, decreased coordination, left inattention Limiting pt's independence with leisure/community pursuits.  Pt referred by team for participation in community reintegration activity.  Discussed purpose of the outing and potential goals with pt, pt agreeable to participate in outing today.  See note below.  Pt participated in community reintegration/lunch outing at overall supervision ambulatory level using RW.  Goals focused on safe functional mobility on various community surface types, identification and negotiation of obstacles, accessing public restrooms, energy conservation, LUE use & visual scanning.  See goal sheet in shadow chart for full details.  No further TR as pt is scheduled for discharge home on 06/01/16.   Discharge Criteria: Patient will be discharged from TR if patient refuses treatment 3 consecutive times without medical reason.  If treatment goals not met, if there is a change in medical status, if patient makes no progress towards goals or if patient is discharged from hospital.  The above assessment, treatment plan, treatment alternatives and goals were discussed and mutually agreed upon: by patient  [place] 05/30/2016, 8:57 AM

## 2016-05-30 NOTE — Progress Notes (Addendum)
Subjective/Complaints: Discussed smoking and diet, pt doesn't feel craving for cigarettes.  Wife smokes. Review of Systems - Negative except insomnia, denies CP/SOB/ bowel or bladder issues  Objective: Vital Signs: Blood pressure 114/53, pulse 52, temperature 97.9 F (36.6 C), temperature source Oral, resp. rate 17, height _0  (1.676 m), weight 72.621 kg (160 lb 1.6 oz), SpO2 98 %. No results found. No results found for this or any previous visit (from the past 72 hour(s)).   HEENT: normal Cardio: RRR and no murmur Resp: CTA B/L and unlabored GI: BS positive and NT, ND Extremity:  No Edema Skin:   Bruise left forearm, left pectoral with small firm 1cm mass in area of bruising Neuro: Alert/Oriented, Abnormal Sensory absent LT, LUE and LLE, Abnormal Motor 3- LUE, 4- LLE, Abnormal FMC Ataxic/ dec FMC and Inattention, able to do finger to thumb with eyes closed Musc/Skel:  Normal Gen NAD   Assessment/Plan: 1. 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 Physiatrist is providing close team supervision and 24 hour management of active medical problems listed below. Physiatrist and rehab team continue to assess barriers to discharge/monitor patient progress toward functional and medical goals. FIM: Function - Bathing Position: Shower Body parts bathed by patient: Right arm, Left arm, Abdomen, Chest, Front perineal area, Buttocks, Right upper leg, Left upper leg, Right lower leg, Left lower leg, Back Body parts bathed by helper: Back Assist Level: Touching or steadying assistance(Pt > 75%)  Function- Upper Body Dressing/Undressing What is the patient wearing?: Pull over shirt/dress Pull over shirt/dress - Perfomed by patient: Thread/unthread right sleeve, Thread/unthread left sleeve, Put head through opening, Pull  shirt over trunk (sleeveless shirt) Pull over shirt/dress - Perfomed by helper: Thread/unthread left sleeve, Put head through opening Assist Level: Supervision or verbal cues Function - Lower Body Dressing/Undressing What is the patient wearing?: Underwear, Pants, Socks, Shoes Position: Wheelchair/chair at [employer] - Performed by patient: Thread/unthread right underwear leg, Thread/unthread left underwear leg, Pull underwear up/down Underwear - Performed by helper: Pull underwear up/down, Thread/unthread left underwear leg, Thread/unthread right underwear leg Pants- Performed by patient: Thread/unthread right pants leg, Thread/unthread left pants leg, Pull pants up/down Pants- Performed by helper: Thread/unthread right pants leg, Thread/unthread left pants leg, Pull pants up/down Non-skid slipper socks- Performed by patient: Don/doff right sock, Don/doff left sock Non-skid slipper socks- Performed by helper: Don/doff right sock, Don/doff left sock Socks - Performed by patient: Don/doff right sock, Don/doff left sock Shoes - Performed by patient: Don/doff right shoe, Don/doff left shoe Shoes - Performed by helper: Don/doff left shoe Assist for footwear: Supervision/touching assist Assist for lower body dressing: Touching or steadying assistance (Pt > 75%)  Function - Toileting Toileting activity did not occur: N/A Toileting steps completed by patient: Adjust clothing prior to toileting, Performs perineal hygiene, Adjust clothing after toileting Toileting steps completed by helper: Adjust clothing after toileting Toileting Assistive Devices: Grab bar or rail Assist level: Touching or steadying assistance (Pt.75%)  Function - Air cabin crew transfer assistive device: Grab bar Assist level to toilet: Touching or steadying assistance (Pt > 75%) Assist level from toilet: Touching or steadying assistance (Pt > 75%)  Function - Chair/bed transfer Chair/bed transfer method: Stand  pivot Chair/bed transfer assist level: Touching or steadying assistance (Pt > 75%) Chair/bed transfer assistive device: Armrests Chair/bed transfer details: Manual facilitation for weight shifting, Verbal cues for  sequencing, Verbal cues for safe use of DME/AE  Function - Locomotion: Wheelchair Will patient use wheelchair at discharge?: Yes Type: Manual Max wheelchair distance: 150 Assist Level: Supervision or verbal cues Assist Level: Supervision or verbal cues Wheel 150 feet activity did not occur: Safety/medical concerns Assist Level: Supervision or verbal cues Turns around,maneuvers to table,bed, and toilet,negotiates 3% grade,maneuvers on rugs and over doorsills: No Function - Locomotion: Ambulation Assistive device: Walker-rolling, No device Max distance: 170 Assist level: Touching or steadying assistance (Pt > 75%) Walk 10 feet activity did not occur: Safety/medical concerns Assist level: Touching or steadying assistance (Pt > 75%) Walk 50 feet with 2 turns activity did not occur: Safety/medical concerns Assist level: Touching or steadying assistance (Pt > 75%) Walk 150 feet activity did not occur: Safety/medical concerns Assist level: Touching or steadying assistance (Pt > 75%) Walk 10 feet on uneven surfaces activity did not occur: Safety/medical concerns  Function - Comprehension Comprehension: Auditory Comprehension assistive device: Other Comprehension assist level: Understands complex 90% of the time/cues 10% of the time  Function - Expression Expression: Verbal Expression assist level: Expresses basic needs/ideas: With no assist  Function - Social Interaction Social Interaction assist level: Interacts appropriately with others with medication or extra time (anti-anxiety, antidepressant)., Interacts appropriately with others - No medications needed.  Function - Problem Solving Problem solving assist level: Solves basic 90% of the time/requires cueing < 10% of the  time  Function - Memory Memory assist level: More than reasonable amount of time Patient normally able to recall (first 3 days only): That he or she is in a hospital   Medical Problem List and Plan: 1.  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 Formulized individual treatment plan based on medical history, underlying problem and comorbidities.  2.  DVT Prophylaxis/Anticoagulation: SCDs Monitor for any signs of DVT 3. Pain Management: Tylenol as needed 4. Hypertension. In good range, no meds Filed Vitals:   05/29/16 1418 05/30/16 0441  BP: 136/76 114/53  Pulse: 87 52  Temp: 97.8 F (36.6 C) 97.9 F (36.6 C)  Resp: 17 17   5. Neuropsych: This patient is capable of making decisions on his own behalf. 6. Skin/Wound Care: Routine skin checks, probable calcified hematoma 7. Fluids/Electrolytes/Nutrition: Routine I&O's with follow-up chemistries, discussed cont HDPD at home 8. Hyperlipidemia. Lipitor 9. Tobacco abuse. Nicoderm patch. Provided counseling. 10. Insomnia- trazodone  LOS (Days) 14 A FACE TO FACE EVALUATION WAS PERFORMED  Loral Campi E 05/30/2016, 7:33 AM

## 2016-05-30 NOTE — Progress Notes (Signed)
Social Work Patient ID: Johnathan [name], male   DOB: 1951/05/08, 65 y.o.   MRN: [id number] Met with pt to discuss team conference goals and progress. Feels good about going home Friday and is aware of his deficits. Wife to be in tomorrow at 1:00 pm for education and is aware of need for 24 hr supervision level at home. Pt is more aware of his sensory deficits But unable to adjust them in the moment which makes him a safety risk. See tomorrow.

## 2016-05-30 NOTE — Plan of Care (Signed)
Problem: RH Tub/Shower Transfers Goal: LTG Patient will perform tub/shower transfers w/assist (OT) LTG: Patient will perform tub/shower transfers with assist, with/without cues using equipment (OT)  LTGs downgraded to [place] A based on pt progress

## 2016-05-30 NOTE — Patient Care Conference (Signed)
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 Left-sided weakness 05/16/2016  . Right Carotid artery occlusion with infarction ([name]) 05/16/2016  . Essential hypertension 05/16/2016  . Hyperlipidemia LDL goal <70 05/16/2016  . Cigarette smoker 05/16/2016  . Overweight (BMI 25.0-29.9) 05/16/2016  . 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 Acute respiratory failure ([name]) 05/16/2016  . 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 Gait disturbance, post-stroke   . Hypokalemia   . Hypophosphatemia   . Cerebrovascular accident (CVA) due to thrombosis of right carotid artery [name]) s/p IV tPA, R ICA stent placement, thrombectomy     Expected Discharge Date: Expected Discharge Date: 06/01/16  Team Members Present: Physician leading conference: Dr. Alysia Penna Social Worker Present: Ovidio Kin, LCSW Nurse Present: Junius Creamer, RN PT Present: Other (comment) Landry Mellow kamp-PT) OT Present: Clyda Greener, OT SLP Present: Windell Moulding, SLP PPS Coordinator present : Daiva Nakayama, RN, CRRN     Current Status/Progress Goal Weekly Team Focus  Medical   Still has sensory loss on the left side limiting functional usage.  Medically doing okay. Normotensive off medications.  Maintain medical stability  during rehabilitation stay  Discharge planning   Bowel/Bladder   continent of bothm, lbm, 6/26  remain continent  assess & monitor bowels   Swallow/Nutrition/ Hydration             ADL's   supervision for UB ADLs, Min A for LB ADLs, Min A for toilet/tub bench transfers while ambulating with RW, able to use L UE as gross assist during ADL completion   assess DME needs for d/c, supervision with ADL completion, use L UE as active assist during functional tasks   assessing DME needs, self care mgt retraining, balance remediation, neuro re ed, and pt/family education    Mobility   supervision gait and transfers, min A stairs  supervision transfers and gait  family ed, d/c planning, awareness, balance, gait   Communication             Safety/Cognition/ Behavioral Observations            Pain   c/o bil leg pain (chronic)m, tylenol 1000 mg effective  minimum to no pain  assess & treat pain   Skin   intact  remain free of breakdown  monitor      *See Care Plan and progress notes for long and short-term goals.  Barriers to Discharge: Still unsafe  for ambulation    Possible Resolutions to Barriers:  Caregiver training prior to discharge this week    Discharge Planning/Teaching Needs:  Wife to come in Thursday at 1:00 to go through family training in preparation for DC Friday      Team Discussion:  Progressing toward his supervision level goals, more aware of his  sensory deficits. Still having trouble adjusting in the moment to them-which makes high risk to fall at home without supervision level. Wife coming in for education tomorrow.  Revisions to Treatment Plan:  DC Friday   Continued Need for Acute Rehabilitation Level of Care: The patient requires daily medical management by a physician with specialized training in physical medicine and rehabilitation for the following conditions: Daily direction of a multidisciplinary physical rehabilitation program to ensure safe treatment while  eliciting the highest outcome that is of practical value to the patient.: Yes Daily medical management of patient stability for increased activity during participation in an intensive rehabilitation regime.: Yes Daily analysis of laboratory values and/or radiology reports with any subsequent need for medication adjustment of medical intervention for : Neurological problems  Johnathan [name], Johnathan [name] 05/30/2016, 2:12 PM

## 2016-05-30 NOTE — Progress Notes (Signed)
Occupational Therapy Session Note  Patient Details  Name: Johnathan [name] MRN: [id number] Date of Birth: [birth year]  Today's Date: 05/30/2016 OT Individual Time:  - 0800-0900 60 minutes       Short Term Goals: Week 2:  OT Short Term Goal 1 (Week 2): Pt will complete UB/LB bathing wiht supervision/setup  OT Short Term Goal 2 (Week 2): Pt will maintain midline orientation while bathing on tub bench 90% of the time with mod cuing OT Short Term Goal 3 (Week 2): Pt will complete UB/LB dressing with supervision/setup OT Short Term Goal 4 (Week 2): Pt will complete toileting transfer and tasks with supervision/setup OT Short Term Goal 5 (Week 2): Family education will be completed regarding skilled OT d/c plan  Skilled Therapeutic Interventions/Progress Updates:    Pt participated in self care mgt retraining tx focusing on ADL completion in simulated home environment for d/c with discussion on DME needs. Shower modified to incorporate threshold at entrance of walk in shower with tub bench turned. Pt completed transfer backing up into shower with RW exhibiting strong left lean and unsteadiness while navigating over threshold with Mod A secondary to not able to lift L LE over threshold. Pt completed alternative side step transfer with RW over simulated shower threshold with steadying assistance and instruction on technique. Due to pts home shower setup, at this time skilled OT recommends continuing with improving back up shower transfer abilities with use of RW for carryover post d/c. Pt verbalized agreement. Pt completed UB bathing with supervision incorporating left UE as gross assist and stabilizer. LB bathing completed standing as needed with grab bars and steadying assistance. Midline orientation maintained during 90% of bathing with no seated LOBs. Pt gathered ADL items in room integrating left hand with cuing. Pt completed UB/LB dressing with supervision in chair at sink standing as needed with cues  for incorporating left hand and orienting shirt. Pt has made great progress with UB dressing since time of evaluation.  Pt still exhibits forward leaning posture and requires manual cues for upright postural alignment. Steadying assistance required still during functional ambulation with RW in room and during functional transfers due to left sided weakness and decreased proprioceptive awareness. Sensory and proprioceptive testing completed to L UE with significant sensory and proprioceptive deficits observed. Pt made aware of deficits and provided education on compensatory strategies to keep L UE safe during functional task completion. Graduation day from skilled OT is tomorrow, will continue to progress towards LTG achievement. Pt left in w/c with QRB and all needs within reach.   Therapy Documentation Precautions:  Precautions Precautions: Fall Precaution Comments: L sided inattention, apraxia Restrictions Weight Bearing Restrictions: No  Pain: No c/o  During skilled OT  Pain Assessment Pain Assessment: 0-10 Pain Score: 0-No pain Pain Type: Chronic pain Pain Location: Leg Pain Orientation: Right;Left Pain Descriptors / Indicators: Aching Pain Frequency: Constant Pain Intervention(s): Medication (See eMAR) (pre-treat for therapy)     See Function Navigator for Current Functional Status.   Therapy/Group: Individual Therapy  Lashai Grosch A Mattison Stuckey 05/30/2016, 10:37 AM

## 2016-05-30 NOTE — Progress Notes (Signed)
Physical Therapy Session Note  Patient Details  Name: Johnathan [name] MRN: [id number] Date of Birth: 1950-12-29  Today's Date: 05/30/2016 PT Individual Time: [id number] PT Individual Time Calculation (min): 30 min   Short Term Goals: Week 2:  PT Short Term Goal 1 (Week 2): = LTGs due to LOS Week 3:     Skilled Therapeutic Interventions/Progress Updates:    Patient up in chair in room, performs stand-pivot transfer with close S and pt verbalizing technique prior to task. In continuation of yesterday's NMR, pt instructed in seated trunk work for lateral weight shifts, anterior pelvic tilt and awareness of L trunk activation on bed. Worked in L sidelying with mod support for L shoulder, trunk and pelvic activation/weight shift with overhead reaching with R UE.Pt had most difficulty with anterior pelvic tilts, needing demo and tactile cues.   Gait with and without device x100' with close S using RW, but Min A without device. Pt needed cues for reciprocal arm swing as well as wide step width during turns and busy environments. Pt instructed on gait over ramp with S using RW and Min A without device. Pt needed up to Mod A during gait on mulch without device due to LLE crossing midline and unable to correct without support. Pt was able to safely complete gait with RW on mulch with min-guard A and cues for safety with RW placement.   Pt instructed in curb step with and without device, needing Min/Mod A respectively with cues for safe foot placement.  Discussed importance of using RW at all times at home for safety at this point. Pt in agreement and understanding. Pt left up in [facility] with lap belt and all needs in reach.   Therapy Documentation Precautions:  Precautions Precautions: Fall Precaution Comments: L sided inattention, apraxia Restrictions Weight Bearing Restrictions: No   Pain: none    See Function Navigator for Current Functional Status.   Therapy/Group: Individual Therapy  Kennieth Rad, PT, DPT  Kennieth Rad M 05/30/2016, 8:49 AM

## 2016-05-30 NOTE — Progress Notes (Signed)
Occupational Therapy Note  Patient Details  Name: Johnathan [name] MRN: [id number] Date of Birth: Jun 01, 1951  Today's Date: 05/30/2016 OT Concurrent Time: 1030-1215 OT Concurrent Time Calculation (min): 105 min  Pt denied pain Individual Therapy  Pt participated in community reintegration outing to area restaurant.  Focus on increased LUE use during meal, accessing public rest room facilities, safety awareness, utilizing learned strategies/techniques for navigating steps and curbs, and social interaction.  Pt required min verbal cues for positioning of LUE on RW when beginning to ambulate and for positioning of LLE when initially standing.  Pt required min verbal cues to incorporate correct strategies for stepping up/down steps and curb.  Pt required min A for managing door into bathroom and agreed that he would need assistance if accessing public rest rooms after discharge.   Leotis Shames [facility] 05/30/2016, 12:54 PM

## 2016-05-31 ENCOUNTER — Inpatient Hospital Stay (HOSPITAL_COMMUNITY): Payer: 59 | Admitting: Occupational Therapy

## 2016-05-31 ENCOUNTER — Ambulatory Visit (HOSPITAL_COMMUNITY): Payer: 59 | Admitting: *Deleted

## 2016-05-31 DIAGNOSIS — N189 Chronic kidney disease, unspecified: Secondary | ICD-10-CM

## 2016-05-31 MED ORDER — TRAZODONE HCL 50 MG PO TABS: 50.0000 mg | ORAL_TABLET | Freq: Every evening | ORAL | Status: DC | PRN

## 2016-05-31 MED ORDER — ALPRAZOLAM 0.25 MG PO TABS: 0.2500 mg | ORAL_TABLET | Freq: Every evening | ORAL | Status: DC | PRN

## 2016-05-31 MED ORDER — ATORVASTATIN CALCIUM 10 MG PO TABS: 10.0000 mg | ORAL_TABLET | Freq: Every day | ORAL | Status: DC

## 2016-05-31 MED ORDER — NICOTINE 14 MG/24HR TD PT24: 14.0000 mg | MEDICATED_PATCH | Freq: Every day | TRANSDERMAL | Status: DC

## 2016-05-31 MED ORDER — ASPIRIN 325 MG PO TABS: 325.0000 mg | ORAL_TABLET | Freq: Every day | ORAL | Status: DC

## 2016-05-31 MED ORDER — PANTOPRAZOLE SODIUM 40 MG PO TBEC: 40.0000 mg | DELAYED_RELEASE_TABLET | Freq: Every day | ORAL | Status: DC

## 2016-05-31 MED ORDER — CLOPIDOGREL BISULFATE 75 MG PO TABS: 75.0000 mg | ORAL_TABLET | Freq: Every day | ORAL | Status: DC

## 2016-05-31 NOTE — Discharge Summary (Signed)
Discharge summary job [phone]

## 2016-05-31 NOTE — Progress Notes (Signed)
Physical Therapy Discharge Summary  Patient Details  Name: Johnathan [name] MRN: [id number] Date of Birth: December 11, 1950  Today's Date: 05/31/2016 PT Individual Time: 1400-1500 PT Individual Time Calculation (min): 60 min    Patient has met 13 of 13 long term goals due to improved activity tolerance, improved balance, improved postural control, increased strength, ability to compensate for deficits, functional use of  left upper extremity and left lower extremity, improved attention, improved awareness and improved coordination.  Patient to discharge at an ambulatory level Supervision.   Patient's care partner is independent to provide the necessary cognitive assistance at discharge.  Reasons goals not met: N/A  Recommendation:  Patient will benefit from ongoing skilled PT services in outpatient setting following HHPT if able to continue to advance safe functional mobility, address ongoing impairments in LLE sensory therapy, motor control, righting reactions, and minimize fall risk.  Equipment: RW with 5" wheels  Reasons for discharge: treatment goals met and discharge from hospital  Patient/family agrees with progress made and goals achieved: Yes   DC tx included family training, thus we were not able to accomplish a follow-up Berg balance test. Pt performed all of the following mobility tasks with S and wife was able to safety return demonstrate guarding and assist prn without cues. She was very attentive. Training included WC propulsion in controlled and apartment settings, car transfer, ramp, curb, and mulch with RW, and gait in controlled and home settings x200' with RW. Pt performed WC mobility and bed mobility at Mod I level. Educated pt and family on safety implications of sensation loss and L-inattention and they verbalize understanding. Pt was issued and instructed on [place] for fall risk reduction and performed x10 each with cues for technique. Discussed fall recovery and demonstrated  technique. Pt felt he did not need to attempt this again as he safely performed days ago. Wife in agreement. Pt completed 5 min on Nustep for NMR challenge. Pt was left up in room with spouse and all needs in reach.    PT Discharge Precautions/Restrictions Precautions Precautions: Fall Precaution Comments: L sided inattention, sensation loss, apraxia Restrictions Weight Bearing Restrictions: No Vital Signs Therapy Vitals Temp: 97.9 F (36.6 C) Temp Source: Oral Pulse Rate: 78 Resp: 16 BP: 137/74 mmHg Patient Position (if appropriate): Sitting Oxygen Therapy SpO2: 100 % O2 Device: Not Delivered Pain   Vision/Perception  Vision - History Baseline Vision: No visual deficits Vision - Assessment Eye Alignment: Within Functional Limits Perception Perception: Impaired Inattention/Neglect: Does not attend to left side of body Praxis Praxis-Other Comments: Per OT - Pt still demonstrates decreased ability to let go of objects at times such as the walker when transitioning from standing to sitting. He also still exhibits slight dressing apraxia requring cueing to help donn shirt and pants secondary to perceptual deficits and dressing apraxia.  Cognition Overall Cognitive Status: Within Functional Limits for tasks assessed Arousal/Alertness: Awake/alert Orientation Level: Oriented X4 Safety/Judgment: Appears intact Sensation Sensation Light Touch: Impaired Detail Light Touch Impaired Details: Absent LUE;Absent LLE Proprioception: Impaired Detail Proprioception Impaired Details: Absent LUE;Absent LLE Additional Comments: Absent light touch foot to thigh. Imapired proprioception at foot and knee.  Coordination Gross Motor Movements are Fluid and Coordinated: No Fine Motor Movements are Fluid and Coordinated: No Coordination and Movement Description: Pt uses LLE functionally in all activities, but needs to visualize it for accurate placement. LUE synergy pattern noted with  challenging LLE motions.  Heel Shin Test: improved symetry since eval Motor  Motor Motor: Hemiplegia;Abnormal  postural alignment and control Motor - Skilled Clinical Observations: Moderate left hemiparesis noted in the LLE and LUE Motor - Discharge Observations: Mild LUE and LLE noted but improved since eval.   Mobility Bed Mobility Rolling Right: 6: Modified independent (Device/Increase time) Rolling Left: 6: Modified independent (Device/Increase time) Supine to Sit: 6: Modified independent (Device/Increase time) Sit to Supine: 6: Modified independent (Device/Increase time) Transfers Sit to Stand: 5: Supervision Stand to Sit: 5: Supervision;With upper extremity assist;To toilet;With armrests Stand Pivot Transfers: 5: Supervision Stand Pivot Transfer Details (indicate cue type and reason): Increased time and effort to attend to LUE and LLE placement. Occasional safety cues Locomotion  Ambulation Ambulation: Yes Ambulation/Gait Assistance: 5: Supervision Ambulation Distance (Feet): 200 Feet Assistive device: Rolling walker Ambulation/Gait Assistance Details: Verbal cues for precautions/safety;Verbal cues for sequencing Stairs / Additional Locomotion Stairs: Yes Stairs Assistance: 5: Supervision Stairs Assistance Details: Verbal cues for precautions/safety Stairs Assistance Details (indicate cue type and reason): Cues for full foot placement Stair Management Technique: One rail Right;Alternating pattern Number of Stairs: 12 Height of Stairs: 6 Ramp: 5: Supervision Curb: 5: Psychiatric nurse: Yes Wheelchair Assistance: 6: Modified independent (Device/Increase time) Environmental health practitioner: Right upper extremity;Right lower extremity Wheelchair Parts Management: Independent Distance: 150  Trunk/Postural Assessment  Cervical Assessment Cervical Assessment: Exceptions to [facility] (cervical flexion and protraction ) Thoracic Assessment Thoracic  Assessment: Exceptions to [facility] (right trunk passive elongation and left side shortening as w) Lumbar Assessment Lumbar Assessment: Exceptions to [facility] (posterior pelvic tilt with lumbar flexion at rest and increa) Postural Control Postural Control: Deficits on evaluation Protective Responses: Pt with increased weightbearing over the left hip in sitting with LOB to the left during with functional tasks including supine<>sit and car transfer.   Balance Balance Balance Assessed: Yes Static Sitting Balance Static Sitting - Balance Support: Feet supported Static Sitting - Level of Assistance: 6: Modified independent (Device/Increase time) Dynamic Sitting Balance Dynamic Sitting - Balance Support: Feet unsupported Dynamic Sitting - Level of Assistance: 6: Modified independent (Device/Increase time) Static Standing Balance Static Standing - Balance Support: Right upper extremity supported;Left upper extremity supported Static Standing - Level of Assistance: 5: Stand by assistance Dynamic Standing Balance Dynamic Standing - Balance Support: Right upper extremity supported;Left upper extremity supported Dynamic Standing - Level of Assistance: 5: Stand by assistance Extremity Assessment  RUE Assessment RUE Assessment: Within Functional Limits LUE Assessment LUE Assessment: Exceptions to [facility] LUE Strength LUE Overall Strength Comments: Isolated AROM WFLs for wrist, and digits. Increased flexor tone noted in the elbow with active extension and passive extension.  Shoulder flexion AROM 0-110 degrees with slight synergy pattern noted.  Brunnstrum stage V in the arm during functional use with decreased timing during functional reach.  Pt unable to maintain elbow extension with shoulder flexion or when reaching for objects at chest level or higher. RLE Assessment RLE Assessment: Within Functional Limits LLE Assessment LLE Assessment:  (WFL except 4-/5 knee flx and ankle eversion and 4+/5 hip ABD)   See  Function Navigator for Current Functional Status.  Soundra Pilon 05/31/2016, 3:20 PM

## 2016-05-31 NOTE — Discharge Summary (Signed)
NAMERAMSSES, STMARIE NO.:  [id number]  MEDICAL RECORD NO.:  [id number]  LOCATION:  4M02C                        FACILITY:  [facility]  PHYSICIAN:  Charlett Blake, M.D.DATE OF BIRTH:  Jan 08, 1951  DATE OF ADMISSION:  05/16/2016 DATE OF DISCHARGE:                              DISCHARGE SUMMARY   DISCHARGE DIAGNOSES: 1. Right middle cerebral artery infarction, occlusion of right ICA     with stenting, 05/13/2016. 2. SCDs for deep vein thrombosis prophylaxis. 3. Hypertension. 4. Hyperlipidemia. 5. Tobacco abuse.  HISTORY OF PRESENT ILLNESS:  This is a 65 year old right-handed male, history of tobacco abuse, on no prescription medications.  He is married.  Lives with his wife.  He formally worked at [employer].  Presented on 05/12/2016 with left-sided weakness.  Initial CT scan showed no definite acute findings.  MRI of the brain showed extensive acute subacute infarct involving the posterior right MCA territory.  Additional involvement of the medial posterior right frontal lobe along the posterior cingulate gyrus.  Remote infarct in the right occipital lobe.  CT angio of the head and neck showed occlusion of the right ICA in the bulb.  Interventional Radiology consulted, underwent stenting of right ICA.  Remained on ventilator for a short time. Echocardiogram with ejection fraction of 60%.  No wall motion abnormalities.  No emboli.  The patient did not receive tPA.  Neurology Services consulted, maintained on aspirin, Plavix therapy.  Tolerating a regular diet.  Physical and occupational therapy ongoing.  The patient was admitted for comprehensive rehab program.  PAST MEDICAL HISTORY:  See discharge diagnoses.  SOCIAL HISTORY:  Lives with spouse, independent prior to admission, retired.  Functional status upon admission to rehab services was max assist to ambulate 5 feet; +2 physical assist sit-to-stand; mod max assist, activities of daily  living.  PHYSICAL EXAMINATION:  VITAL SIGNS:  Blood pressure 138/57, pulse 85, temperature 99, respirations 18. GENERAL:  This is an alert male, in no acute distress, oriented x3. LUNGS:  Clear to auscultation without wheeze. CARDIAC:  Regular rate and rhythm.  No murmur. ABDOMEN:  Soft, nontender.  Good bowel sounds.  REHABILITATION HOSPITAL COURSE:  The patient was admitted to inpatient rehab services with therapies initiated on a 3-hour daily basis consisting of physical therapy, occupational therapy, and rehabilitation nursing.  Following issues were addressed during the patient's rehabilitation stay.  Pertaining to Mr. Gibbins, right middle cerebral artery infarction.  He had undergone stenting of right ICA for occlusion.  Remained on aspirin, Plavix therapy.  He would follow up with Neurology Services as well as Interventional Radiology.  SCDs for DVT prophylaxis.  No signs of DVT.  Blood pressure is controlled on no current antihypertensive medication.  He did have a history of tobacco abuse.  Maintained on a NicoDerm patch.  Received full counseling in regard to cessation of nicotine products.  No bowel or bladder disturbances.  The patient received weekly collaborative interdisciplinary team conferences to discuss estimated length of stay, family teaching, any barriers to discharge.  The patient ambulating 100 feet close supervision rolling walker.  Minimal assist without device. Needed some cues for reciprocal arm swing.  Instructed in curb step  with and without device needing min-mod assist.  Activities of daily living homemaking focused on left upper extremity used during meals, assessing public restroom facilities, safety awareness, utilizing and learning strategies, techniques for navigating steps and curbs and social interaction.  Required minimal verbal cues for positioning left upper extremity on rolling walker when beginning to ambulate.  The patient tolerating a  regular diet.  Full family teaching completed and plan, discharge to home.  DISCHARGE MEDICATIONS:  Xanax 0.25 mg p.o. every 6 hours as needed, aspirin 325 mg p.o. daily, Lipitor 10 mg p.o. daily, Plavix 75 mg p.o. daily, NicoDerm patch 14 mg daily, taper as directed.  DIET:  Regular.  FOLLOWUP:  The patient would follow up with Dr. Alysia Penna at the outpatient rehab center as directed; Dr. Antony Contras, Neurology Services 1 month, call for appointment; Dr. Estanislado Pandy of Intervention Radiology, call for appointment; Dr. Antony Contras, 07/09/2016, medical management.  The patient advised no smoking, drinking, or driving.     Lauraine Rinne, P.A.   ______________________________ Charlett Blake, M.D.    DA/MEDQ  D:  05/31/2016  T:  05/31/2016  Job:  [id number]  cc:   Charlett Blake, M.D. Pramod P. Leonie Man, MD Fritz Pickerel. Estanislado Pandy, M.D. Antony Contras, M.D.

## 2016-05-31 NOTE — Progress Notes (Signed)
Occupational Therapy Discharge Summary  Patient Details  Name: Johnathan [name] MRN: [id number] Date of Birth: [birth year]  Patient has met 11 of 11 long term goals due to improved balance, postural control, ability to compensate for deficits, functional use of  LEFT upper and LEFT lower extremity, improved attention, improved awareness and improved coordination.  Patient to discharge at overall Supervision level.  Patient's care partner is independent to provide the necessary physical and cognitive assistance at discharge.    Reasons goals not met: NA  Recommendation:  Patient will benefit from ongoing skilled OT services in home health setting to continue to advance functional skills in the area of BADL and Reduce care partner burden.  Johnathan [name] has demonstrated great progress from a max assist level for selfcare tasks to a supervison/min guard assist level currently.  He still demonstrates some decreased awareness of his left side as well as impaired light touch and proprioception, which affect his ADL function. In addition, he still has a significant fall risk which affects his ability to be alone at this time.  LUE function and use continues to improve but he still demonstrates decreased proximal strength, timing, and coordination.  Have educated and provided handouts on home exercises that he can continue. Recommend continued HHOT to further progress independence level with ADLs and to increase LUE function to as close as possible to PLOF.    Equipment: 3:1, tub seat  Reasons for discharge: treatment goals met  Patient/family agrees with progress made and goals achieved: Yes  OT Discharge Precautions/Restrictions  Precautions Precautions: Fall Precaution Comments: L sided inattention, apraxia  Pain Pain Assessment Pain Assessment: No/denies pain ADL  See Function Section of Chart  Vision/Perception  Vision- History Baseline Vision/History: No visual deficits;Wears glasses Wears  Glasses: Reading only Patient Visual Report: No change from baseline Vision- Assessment Vision Assessment?: No apparent visual deficits;Yes Eye Alignment: Within Functional Limits Ocular Range of Motion: Within Functional Limits Alignment/Gaze Preference: Chin down;Head tilt (slight head tilt to the left) Tracking/Visual Pursuits: Able to track stimulus in all quads without difficulty Saccades: Within functional limits Convergence: Within functional limits Visual Fields: No apparent deficits Praxis Praxis-Other Comments: Pt still demonstrates decreased ability to let go of objects at times such as the walker when transitioning from standing to sitting.  He also still exhibits slight dressing apraxia requring cueing to help donn shirt and pants secondary to perceptual deficits and dressing apraxia.   Cognition Overall Cognitive Status: Impaired/Different from baseline Arousal/Alertness: Awake/alert Orientation Level: Oriented X4 Attention: Focused;Sustained Focused Attention: Appears intact Sustained Attention: Appears intact Memory: Appears intact Awareness: Impaired Awareness Impairment: Anticipatory impairment Problem Solving: Impaired Problem Solving Impairment: Functional complex Comments: Pt still with some limited anticipatory awareness of deficits.  He feels at times he may be OK to get up and go to the bathroom at home with use of the walker by himself.  therapist re-directed him to not try this as it is not safe.  He voiced understanding.   Sensation Sensation Light Touch: Impaired Detail Light Touch Impaired Details: Absent LUE Stereognosis: Impaired Detail Stereognosis Impaired Details: Impaired LUE Proprioception: Impaired Detail Proprioception Impaired Details: Impaired LUE Additional Comments: Absent light touch from elbow distally in the LUE.  Impaired proprioception from in the wrist and digits as well. Coordination Gross Motor Movements are Fluid and Coordinated:  No Fine Motor Movements are Fluid and Coordinated: No Coordination and Movement Description: Pt uses the LUE functionally with all selfcare tasks at a diminished level.  Greater  distal movement with increased FM coordination noted during dressing tasks compared to proximal movement.  Pt with decreased ability to achieve full elbow extension during functional tasks and demonstrates slight shoulder hike and synergy pattern.  9 Hole Peg Test: 25 seconds on the right,  47 seconds on the left Motor  Motor Motor: Hemiplegia;Abnormal postural alignment and control Motor - Skilled Clinical Observations: Moderate left hemiparesis noted in the LLE and LUE Motor - Discharge Observations: Mild LUE and LLE noted but improved since eval. Mobility  Transfers Transfers: Sit to Stand;Stand to Sit Sit to Stand: 5: Supervision;With armrests;From toilet;With upper extremity assist Stand to Sit: 5: Supervision;With upper extremity assist;To toilet;With armrests  Trunk/Postural Assessment  Cervical Assessment Cervical Assessment: Exceptions to [facility] (cervical flexion and protraction ) Thoracic Assessment Thoracic Assessment: Exceptions to [facility] (right trunk passive elongation and left side shortening as well as slight thoracic flexion) Lumbar Assessment Lumbar Assessment: Exceptions to [facility] (posterior pelvic tilt with lumbar flexion at rest and increased weightbearing over the  left hip) Postural Control Postural Control: Deficits on evaluation  Balance Dynamic Sitting Balance Dynamic Sitting - Balance Support: No upper extremity supported Dynamic Sitting - Level of Assistance: 6: Modified independent (Device/Increase time) Static Standing Balance Static Standing - Level of Assistance: 5: Stand by assistance Dynamic Standing Balance Dynamic Standing - Level of Assistance: 4: Min assist Extremity/Trunk Assessment RUE Assessment RUE Assessment: Within Functional Limits LUE Assessment LUE Assessment: Exceptions  to [facility] LUE Strength LUE Overall Strength Comments: Isolated AROM WFLs for wrist, and digits. Increased flexor tone noted in the elbow with active extension and passive extension.  Shoulder flexion AROM 0-110 degrees with slight synergy pattern noted.  Brunnstrum stage V in the arm during functional use with decreased timing during functional reach.  Pt unable to maintain elbow extension with shoulder flexion or when reaching for objects at chest level or higher.   See Function Navigator for Current Functional Status.  Odean Mcelwain OTR/L 05/31/2016, 9:08 AM

## 2016-05-31 NOTE — Progress Notes (Signed)
Occupational Therapy Session Note  Patient Details  Name: Johnathan [name] MRN: [id number] Date of Birth: 1951/07/03  Today's Date: 05/31/2016 OT Individual Time: [id number] OT Individual Time Calculation (min): 75 min    Short Term Goals: Week 2:  OT Short Term Goal 1 (Week 2): Pt will complete UB/LB bathing wiht supervision/setup  OT Short Term Goal 2 (Week 2): Pt will maintain midline orientation while bathing on tub bench 90% of the time with mod cuing OT Short Term Goal 3 (Week 2): Pt will complete UB/LB dressing with supervision/setup OT Short Term Goal 4 (Week 2): Pt will complete toileting transfer and tasks with supervision/setup OT Short Term Goal 5 (Week 2): Family education will be completed regarding skilled OT d/c plan  Skilled Therapeutic Interventions/Progress Updates:    Pt ambulated down to the ADL apartment for practice with simulated walk-in shower transfers with use of the RW.  He was able to step in backwards as well as sideways with increased time and close supervision.  Discussed need for shower seat as well for safety and pt also agrees with this.  Transitioned from the ADL apartment to the therapy gym for further work on balance and neuromuscular re-education.  Had pt work in standing and reaching to the floor to pick up cups placed in various locations.  Min guard assist to complete this without use of an assistive device.  Had pt work on [employer] coordination and gross motor coordination as well in sitting with the LUE.  Pt performed dowel rod exercises in sitting for shoulder flexion.  Mod instructional cueing with min assist to maintain left elbow extension while completing shoulder flexion 0-120 degrees.  He completed 1 set of 10 repetitions.  Pt also completed in-hand manipulation picking up medium sized one inch pegs and placing them into grid with modified independence and increased time.  He was able to translate from palm to fingertips and vice versa.  Had him complete 8  mins of RUE work on the [employer] with resistance on level 1.  Emphasis on elbow extension throughout the movement without compensation in the trunk.  Finished session with ambulation back to the room without assistive device and min guard facilitation.  Pt left in wheelchair with call button and phone in reach.    Therapy Documentation Precautions:  Precautions Precautions: Fall Precaution Comments: L sided inattention, apraxia Restrictions Weight Bearing Restrictions: No  Pain: Pain Assessment Pain Assessment: No/denies pain ADL:   See Function Navigator for Current Functional Status.   Therapy/Group: Individual Therapy  Morganna Styles OTR/L 05/31/2016, 10:27 AM

## 2016-05-31 NOTE — Progress Notes (Cosign Needed)
Occupational Therapy Session Note  Patient Details  Name: Johnathan [name] MRN: [id number] Date of Birth: 1951/06/26  Today's Date: 05/31/2016 OT Individual Time: 1300-1400  OT Individual Time Calculation (min): 60 min    Short Term Goals: Week 2:  OT Short Term Goal 1 (Week 2): Pt will complete UB/LB bathing wiht supervision/setup  OT Short Term Goal 2 (Week 2): Pt will maintain midline orientation while bathing on tub bench 90% of the time with mod cuing OT Short Term Goal 3 (Week 2): Pt will complete UB/LB dressing with supervision/setup OT Short Term Goal 4 (Week 2): Pt will complete toileting transfer and tasks with supervision/setup OT Short Term Goal 5 (Week 2): Family education will be completed regarding skilled OT d/c plan  Skilled Therapeutic Interventions/Progress Updates:    Skilled OT intervention focused on family education prior to discharge. Pts wife [name] was present during ADL session, with visual demonstrations and active involvement in providing supervision for pt while completing basic self care in simulated home environment. Caregiver was provided education on pts left sided neglect and impaired left UE sensation for monitoring safety with affected UE at home during functional transfers and functional task completion. Caregiver was provided instruction on proper transfer techniques for shower and toilet with visual demonstration of carryover. Pt completed UB/LB ADLs in shower and w/c level at sink as needed at supervision level. Pt continues to demonstrate left sided weakness, decreased proprioceptive awareness and functional use of L UE. Family education was provided on therapeutic activities to complete at home with handout as well as follow up with home health OT. Pt caregiver reported not having remaining DME needs. Shower chair and BSC will be provided. Pt left in w/c with all needs within reach and wife present.     Therapy Documentation Precautions:   Precautions Precautions: Fall Precaution Comments: L sided inattention, sensation loss, apraxia Restrictions Weight Bearing Restrictions: No General:   Vital Signs: Therapy Vitals Temp: 97.9 F (36.6 C) Temp Source: Oral Pulse Rate: 78 Resp: 16 BP: 137/74 mmHg Patient Position (if appropriate): Sitting Oxygen Therapy SpO2: 100 % O2 Device: Not Delivered     See Function Navigator for Current Functional Status.   Therapy/Group: Individual Therapy    Brittnye Josephs A Brently Voorhis 05/31/2016, 4:47 PM

## 2016-05-31 NOTE — Progress Notes (Signed)
Subjective/Complaints: No new issues. Feels like he's prepared to go home!    Review of Systems - Negative except insomnia, denies CP/SOB/ bowel or bladder issues  Objective: Vital Signs: Blood pressure 150/81, pulse 66, temperature 97.7 F (36.5 C), temperature source Oral, resp. rate 16, height _0  (1.676 m), weight 73.71 kg (162 lb 8 oz), SpO2 100 %. No results found. Results for orders placed or performed during the hospital encounter of 05/16/16 (from the past 72 hour(s))  Basic metabolic panel     Status: None   Collection Time: 05/30/16 10:21 AM  Result Value Ref Range   Sodium 136 135 - 145 mmol/L   Potassium 4.3 3.5 - 5.1 mmol/L   Chloride 103 101 - 111 mmol/L   CO2 28 22 - 32 mmol/L   Glucose, Bld 87 65 - 99 mg/dL   BUN 15 6 - 20 mg/dL   Creatinine, Ser 0.98 0.61 - 1.24 mg/dL   Calcium 9.2 8.9 - 10.3 mg/dL   GFR calc non Af Amer >60 >60 mL/min   GFR calc Af Amer >60 >60 mL/min    Comment: (NOTE) The eGFR has been calculated using the CKD EPI equation. This calculation has not been validated in all clinical situations. eGFR's persistently <60 mL/min signify possible Chronic Kidney Disease.    Anion gap 5 5 - 15     HEENT: normal Cardio: RRR and no murmur Resp: CTA B/L and unlabored GI: BS positive and NT, ND Extremity:  No Edema Skin:   Bruise left forearm, left pectoral with small firm 1cm mass in area of bruising Neuro: Alert/Oriented, Abnormal Sensory absent LT, LUE and LLE, Abnormal Motor 3- LUE, 4- LLE, Abnormal FMC Ataxic/ dec FMC and Inattention, able to do finger to thumb with eyes closed Musc/Skel:  Normal Gen NAD   Assessment/Plan: 1. 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 Physiatrist is providing close team supervision and 24 hour management of active medical problems  listed below. Physiatrist and rehab team continue to assess barriers to discharge/monitor patient progress toward functional and medical goals.  FIM: Function - Bathing Position: Shower Body parts bathed by patient: Right arm, Left arm, Abdomen, Chest, Front perineal area, Right upper leg, Buttocks, Left upper leg, Right lower leg, Left lower leg, Back Body parts bathed by helper: Back Assist Level: Touching or steadying assistance(Pt > 75%)  Function- Upper Body Dressing/Undressing What is the patient wearing?: Pull over shirt/dress Pull over shirt/dress - Perfomed by patient: Thread/unthread right sleeve, Thread/unthread left sleeve, Put head through opening, Pull shirt over trunk Pull over shirt/dress - Perfomed by helper: Thread/unthread left sleeve, Put head through opening Assist Level: Supervision or verbal cues Function - Lower Body Dressing/Undressing What is the patient wearing?: Underwear, Pants, Socks, Shoes Position: Wheelchair/chair at [employer] - Performed by patient: Thread/unthread right underwear leg, Thread/unthread left underwear leg, Pull underwear up/down Underwear - Performed by helper: Pull underwear up/down, Thread/unthread left underwear leg, Thread/unthread right underwear leg Pants- Performed by patient: Thread/unthread right pants leg, Thread/unthread left pants leg, Pull pants up/down Pants- Performed by helper: Thread/unthread right pants leg, Thread/unthread left pants leg, Pull pants up/down Non-skid slipper socks- Performed by patient: Don/doff right sock, Don/doff left sock Non-skid slipper socks- Performed by helper: Don/doff right sock, Don/doff left sock Socks - Performed by patient: Don/doff right sock, Don/doff left sock Shoes - Performed  by patient: Don/doff right shoe, Don/doff left shoe Shoes - Performed by helper: Don/doff left shoe Assist for footwear: Supervision/touching assist Assist for lower body dressing: Touching or steadying  assistance (Pt > 75%)  Function - Toileting Toileting activity did not occur: N/A Toileting steps completed by patient: Adjust clothing prior to toileting, Performs perineal hygiene, Adjust clothing after toileting Toileting steps completed by helper: Adjust clothing after toileting Toileting Assistive Devices: Grab bar or rail Assist level: Supervision or verbal cues  Function - Air cabin crew transfer assistive device: Grab bar Assist level to toilet: Supervision or verbal cues Assist level from toilet: Supervision or verbal cues  Function - Chair/bed transfer Chair/bed transfer method: Stand pivot Chair/bed transfer assist level: Supervision or verbal cues Chair/bed transfer assistive device: Armrests Chair/bed transfer details: Verbal cues for technique, Verbal cues for sequencing  Function - Locomotion: Wheelchair Will patient use wheelchair at discharge?: Yes Type: Manual Max wheelchair distance: 150 Assist Level: Supervision or verbal cues Assist Level: Supervision or verbal cues Wheel 150 feet activity did not occur: Safety/medical concerns Assist Level: Supervision or verbal cues Turns around,maneuvers to table,bed, and toilet,negotiates 3% grade,maneuvers on rugs and over doorsills: No Function - Locomotion: Ambulation Assistive device: No device Max distance: 100 Assist level: Touching or steadying assistance (Pt > 75%) Walk 10 feet activity did not occur: Safety/medical concerns Assist level: Touching or steadying assistance (Pt > 75%) Walk 50 feet with 2 turns activity did not occur: Safety/medical concerns Assist level: Touching or steadying assistance (Pt > 75%) Walk 150 feet activity did not occur: Safety/medical concerns Assist level: Touching or steadying assistance (Pt > 75%) Walk 10 feet on uneven surfaces activity did not occur: Safety/medical concerns Assist level: Moderate assist (Pt 50 - 74%)  Function - Comprehension Comprehension:  Auditory Comprehension assistive device: Other Comprehension assist level: Understands complex 90% of the time/cues 10% of the time  Function - Expression Expression: Verbal Expression assist level: Expresses basic needs/ideas: With no assist  Function - Social Interaction Social Interaction assist level: Interacts appropriately with others - No medications needed.  Function - Problem Solving Problem solving assist level: Solves basic 90% of the time/requires cueing < 10% of the time  Function - Memory Memory assist level: More than reasonable amount of time Patient normally able to recall (first 3 days only): That he or she is in a hospital   Medical Problem List and Plan: 1.  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  DVT Prophylaxis/Anticoagulation: SCDs Monitor for any signs of DVT 3. Pain Management: Tylenol as needed 4. Hypertension. In good range, no meds Filed Vitals:   05/30/16 1249 05/31/16 0537  BP: 115/70 150/81  Pulse: 79 66  Temp: 98.2 F (36.8 C) 97.7 F (36.5 C)  Resp: 17 16   5. Neuropsych: This patient is capable of making decisions on his own behalf.  6. Skin/Wound Care: Routine skin checks, probable calcified hematoma 7. Fluids/Electrolytes/Nutrition: Routine I&O's with follow-up chemistries, discussed cont HDPD at home 8. Hyperlipidemia. Lipitor 9. Tobacco abuse. Nicoderm patch. Provided counseling. 10. Insomnia- trazodone  LOS (Days) 15 A FACE TO FACE EVALUATION WAS PERFORMED  SWARTZ,ZACHARY T 05/31/2016, 8:53 AM

## 2016-05-31 NOTE — Progress Notes (Signed)
Social Work Patient ID: Johnathan [name], male   DOB: 03/21/1951, 65 y.o.   MRN: [id number] Met with pt and wife who was here to go through family education to discuss any questions had. Wife has applied for FMLA and will contact them to make sure has sent to the [place] office since they have moved. Discussed equipment would be here tonight or tomorrow am. Home health referral made. Prepare for discharge tomorrow.

## 2016-06-01 MED FILL — CLOPIDOGREL 75 MG TABLET: 75 | 30 days supply | Qty: 30 | Fill #0

## 2016-06-01 MED FILL — ALPRAZolam 0.25 MG TABS: 0.25 | 15 days supply | Qty: 15 | Fill #0

## 2016-06-01 MED FILL — traZODone HCL 50 MG TABS: 50 | 30 days supply | Qty: 30 | Fill #0

## 2016-06-01 MED FILL — ATORVASTATIN 10 MG TABLET: 10 | 30 days supply | Qty: 30 | Fill #0

## 2016-06-01 MED FILL — PANTOPRAZOLE SOD DR 40 MG T: 40 | 30 days supply | Qty: 30 | Fill #0

## 2016-06-01 NOTE — Discharge Instructions (Signed)
Inpatient Rehab Discharge Instructions  Johnathan [name] Discharge date and time:  06/01/16  Activities/Precautions/ Functional Status: Activity: No lifting, driving, or strenuous exercise till cleared by MD.  Diet: cardiac diet Wound Care: none needed   Functional status:  ___ No restrictions     ___ Walk up steps independently _X__ 24/7 supervision/assistance   ___ Walk up steps with assistance ___ Intermittent supervision/assistance  ___ Bathe/dress independently ___ Walk with walker     _X_ Bathe/dress with assistance ___ Walk Independently    ___ Shower independently ___ Walk with assistance    ___ Shower with assistance _X__ No alcohol     ___ Return to work/school ________    Special Instructions:   STROKE/TIA DISCHARGE INSTRUCTIONS SMOKING Cigarette smoking nearly doubles your risk of having a stroke & is the single most alterable risk factor  If you smoke or have smoked in the last 12 months, you are advised to quit smoking for your health.  Most of the excess cardiovascular risk related to smoking disappears within a year of stopping.  Ask you doctor about anti-smoking medications  [place] Quit Line: 1-800-QUIT NOW  Free Smoking Cessation Classes [phone]  CHOLESTEROL Know your levels; limit fat & cholesterol in your diet  Lipid Panel     Component Value Date/Time   CHOL 127 05/13/2016 0210   TRIG 136 05/13/2016 0210   HDL 30* 05/13/2016 0210   CHOLHDL 4.2 05/13/2016 0210   VLDL 27 05/13/2016 0210   LDLCALC 70 05/13/2016 0210      Many patients benefit from treatment even if their cholesterol is at goal.  Goal: Total Cholesterol (CHOL) less than 160  Goal:  Triglycerides (TRIG) less than 150  Goal:  HDL greater than 40  Goal:  LDL (LDLCALC) less than 100   BLOOD PRESSURE American Stroke Association blood pressure target is less that 120/80 mm/Hg  Your discharge blood pressure is:  BP: 140/81 mmHg  Monitor your blood pressure  Limit your salt and  alcohol intake  Many individuals will require more than one medication for high blood pressure  DIABETES (A1c is a blood sugar average for last 3 months) Goal HGBA1c is under 7% (HBGA1c is blood sugar average for last 3 months)  Diabetes: No known diagnosis of diabetes    Lab Results  Component Value Date   HGBA1C 5.6 05/13/2016     Your HGBA1c can be lowered with medications, healthy diet, and exercise.  Check your blood sugar as directed by your physician  Call your physician if you experience unexplained or low blood sugars.  PHYSICAL ACTIVITY/REHABILITATION Goal is 30 minutes at least 4 days per week  Activity: Increase activity slowly.No Driving.  Therapies: Physical Therapy: Home Health Return to work: N/A  Activity decreases your risk of heart attack and stroke and makes your heart stronger.  It helps control your weight and blood pressure; helps you relax and can improve your mood.  Participate in a regular exercise program.  Talk with your doctor about the best form of exercise for you (dancing, walking, swimming, cycling).  DIET/WEIGHT Goal is to maintain a healthy weight  Your discharge diet is: Diet Heart Room service appropriate?: Yes; Fluid consistency:: Thin  liquids Your height is:  Height: 5\' 6"  (167.6 cm) Your current weight is: Weight: 74.2 kg (163 lb 9.3 oz) Your Body Mass Index (BMI) is:  BMI (Calculated): 28.6  Following the type of diet specifically designed for you will help prevent another stroke.  Your  goal weight is:  155 lbs  Your goal Body Mass Index (BMI) is 19-24.  Healthy food habits can help reduce 3 risk factors for stroke:  High cholesterol, hypertension, and excess weight.  RESOURCES Stroke/Support Group:  Call [phone]   STROKE EDUCATION PROVIDED/REVIEWED AND GIVEN TO PATIENT Stroke warning signs and symptoms How to activate emergency medical system (call 911). Medications prescribed at discharge. Need for follow-up after  discharge. Personal risk factors for stroke. Pneumonia vaccine given:  Flu vaccine given:  My questions have been answered, the writing is legible, and I understand these instructions.  I will adhere to these goals & educational materials that have been provided to me after my discharge from the hospital.    [place]:    Home Health:   PT, [place]   Date of last service:06/01/2016   Medical Equipment/Items Ordered:ROLLING WALKER, [place]   [phone] OTHER: [facility] TO CONTACT ONCE HOME TO FOLLOW UP WITH   GENERAL COMMUNITY RESOURCES FOR PATIENT/FAMILY: Support Groups:[facility] SUPPORT [place] @ 3:00-4:00PM ON THE REHAB UNIT QUESTIONS CONTACT KATIE  [phone]   My questions have been answered and I understand these instructions. I will adhere to these goals and the provided educational materials after my discharge from the hospital.  Patient/Caregiver Signature _______________________________ Date __________  Clinician Signature _______________________________________ Date __________  Please bring this form and your medication list with you to all your follow-up doctor's appointments.

## 2016-06-01 NOTE — Progress Notes (Signed)
Subjective/Complaints: Up in chair. Excited and feels prepare to go home today    Review of Systems - Negative except insomnia, denies CP/SOB/ bowel or bladder issues  Objective: Vital Signs: Blood pressure 154/82, pulse 68, temperature 97.5 F (36.4 C), temperature source Oral, resp. rate 18, height 5' 6"  (1.676 m), weight 73.71 kg (162 lb 8 oz), SpO2 97 %. No results found. Results for orders placed or performed during the hospital encounter of 05/16/16 (from the past 72 hour(s))  Basic metabolic panel     Status: None   Collection Time: 05/30/16 10:21 AM  Result Value Ref Range   Sodium 136 135 - 145 mmol/L   Potassium 4.3 3.5 - 5.1 mmol/L   Chloride 103 101 - 111 mmol/L   CO2 28 22 - 32 mmol/L   Glucose, Bld 87 65 - 99 mg/dL   BUN 15 6 - 20 mg/dL   Creatinine, Ser 0.98 0.61 - 1.24 mg/dL   Calcium 9.2 8.9 - 10.3 mg/dL   GFR calc non Af Amer >60 >60 mL/min   GFR calc Af Amer >60 >60 mL/min    Comment: (NOTE) The eGFR has been calculated using the CKD EPI equation. This calculation has not been validated in all clinical situations. eGFR's persistently <60 mL/min signify possible Chronic Kidney Disease.    Anion gap 5 5 - 15     HEENT: normal Cardio: RRR and no murmur Resp: CTA B/L and unlabored GI: BS positive and NT, ND Extremity:  No Edema Skin:   Bruise left forearm, left pectoral with small firm 1cm mass in area of bruising Neuro: Alert/Oriented, Abnormal Sensory absent LT, LUE and LLE, Abnormal Motor 3- LUE, 4- LLE, Abnormal FMC Ataxic/ dec FMC and Inattention, able to do finger to thumb with eyes closed Musc/Skel:  Normal Gen NAD   Assessment/Plan: 1. 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 Physiatrist is providing close team supervision and 24 hour management of active medical problems  listed below. Physiatrist and rehab team continue to assess barriers to discharge/monitor patient progress toward functional and medical goals.  FIM: Function - Bathing Position: Shower Body parts bathed by patient: Right arm, Left arm, Chest, Abdomen, Front perineal area, Buttocks, Right upper leg, Left upper leg, Right lower leg, Left lower leg, Back Body parts bathed by helper: Back Assist Level: Supervision or verbal cues  Function- Upper Body Dressing/Undressing What is the patient wearing?: Pull over shirt/dress Pull over shirt/dress - Perfomed by patient: Thread/unthread right sleeve, Thread/unthread left sleeve, Put head through opening, Pull shirt over trunk Pull over shirt/dress - Perfomed by helper: Thread/unthread left sleeve, Put head through opening Assist Level: Supervision or verbal cues Function - Lower Body Dressing/Undressing What is the patient wearing?: Underwear, Pants, Socks, Shoes Position: Wheelchair/chair at [employer] - Performed by patient: Thread/unthread right underwear leg, Thread/unthread left underwear leg, Pull underwear up/down Underwear - Performed by helper: Pull underwear up/down, Thread/unthread left underwear leg, Thread/unthread right underwear leg Pants- Performed by patient: Thread/unthread right pants leg, Thread/unthread left pants leg, Pull pants up/down Pants- Performed by helper: Thread/unthread right pants leg, Thread/unthread left pants leg, Pull pants up/down Non-skid slipper socks- Performed by patient: Don/doff right sock, Don/doff left sock Non-skid slipper socks- Performed by helper: Don/doff right sock, Don/doff left sock Socks - Performed by patient: Don/doff right sock, Don/doff left sock Shoes - Performed by  patient: Don/doff right shoe, Don/doff left shoe Shoes - Performed by helper: Don/doff left shoe Assist for footwear: Supervision/touching assist Assist for lower body dressing: Supervision or verbal cues  Function -  Toileting Toileting activity did not occur: N/A Toileting steps completed by patient: Adjust clothing prior to toileting, Performs perineal hygiene, Adjust clothing after toileting Toileting steps completed by helper: Adjust clothing after toileting Toileting Assistive Devices: Grab bar or rail Assist level: Supervision or verbal cues  Function - Toilet Transfers Toilet transfer assistive device: Grab bar, Elevated toilet seat/BSC over toilet Assist level to toilet: Supervision or verbal cues Assist level from toilet: Supervision or verbal cues  Function - Chair/bed transfer Chair/bed transfer method: Stand pivot Chair/bed transfer assist level: Supervision or verbal cues Chair/bed transfer assistive device: Armrests Chair/bed transfer details: Verbal cues for technique, Verbal cues for sequencing  Function - Locomotion: Wheelchair Will patient use wheelchair at discharge?: No Type: Manual Max wheelchair distance: 150 Assist Level: No help, No cues, assistive device, takes more than reasonable amount of time Assist Level: No help, No cues, assistive device, takes more than reasonable amount of time Wheel 150 feet activity did not occur: Safety/medical concerns Assist Level: No help, No cues, assistive device, takes more than reasonable amount of time Turns around,maneuvers to table,bed, and toilet,negotiates 3% grade,maneuvers on rugs and over doorsills: No Function - Locomotion: Ambulation Assistive device: Walker-rolling Max distance: 200 Assist level: Supervision or verbal cues Walk 10 feet activity did not occur: Safety/medical concerns Assist level: Supervision or verbal cues Walk 50 feet with 2 turns activity did not occur: Safety/medical concerns Assist level: Supervision or verbal cues Walk 150 feet activity did not occur: Safety/medical concerns Assist level: Supervision or verbal cues Walk 10 feet on uneven surfaces activity did not occur: Safety/medical  concerns Assist level: Supervision or verbal cues  Function - Comprehension Comprehension: Auditory Comprehension assistive device: Other Comprehension assist level: Follows complex conversation/direction with no assist  Function - Expression Expression: Verbal Expression assist level: Expresses complex ideas: With no assist  Function - Social Interaction Social Interaction assist level: Interacts appropriately with others - No medications needed.  Function - Problem Solving Problem solving assist level: Solves complex problems: With extra time  Function - Memory Memory assist level: Complete Independence: No helper Patient normally able to recall (first 3 days only): That he or she is in a hospital   Medical Problem List and Plan: 1.  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 Transitional care clinic patient  -goals met, questions answered   2.  DVT Prophylaxis/Anticoagulation: ambulatory 3. Pain Management: Tylenol as needed 4. Hypertension. In good range, no meds Filed Vitals:   05/31/16 1300 06/01/16 0602  BP: 137/74 154/82  Pulse: 78 68  Temp: 97.9 F (36.6 C) 97.5 F (36.4 C)  Resp: 16 18   5. Neuropsych: This patient is capable of making decisions on his own behalf.  6. Skin/Wound Care: Routine skin checks, probable calcified hematoma 7. Fluids/Electrolytes/Nutrition: Routine I&O's with follow-up chemistries, discussed cont HDPD at home 8. Hyperlipidemia. Lipitor 9. Tobacco abuse. Nicoderm patch. Provided counseling. 10. Insomnia- trazodone  LOS (Days) 16 A FACE TO FACE EVALUATION WAS PERFORMED  Charle Clear T 06/01/2016, 8:51 AM

## 2016-06-01 NOTE — Progress Notes (Signed)
Social Work  Discharge Note  The overall goal for the admission was met for:   Discharge location: Yes-HOME WITH WIFE AND SON TO ASSIST-24 HR SUPERVISION   Length of Stay: Yes-16 DAYS  Discharge activity level: Yes-SUPERVISION LEVEL  Home/community participation: Yes  Services provided included: MD, RD, PT, OT, SLP, RN, CM, TR, Pharmacy, Neuropsych and SW  Financial Services: Private Insurance: [facility]  Follow-up services arranged: Home Health: [facility] CARE-PT,OT,RN, DME: [facility]-ROLLING WALK[facility], [place] and Patient/Family has no preference for HH/DME agencies  Comments (or additional information):WIFE WAS HERE [place]. WIFE AWARE OF RECOMMENDATION OF 24 HR SUPERVISION. PCP OBTAINED WITH DR [facility] APPT 8/7 @ 11:45 AM WIFE AWARE OF APPT. WILL ALSO BE FOLLOWED BY [facility]-CONTACT AT HOME.  Patient/Family verbalized understanding of follow-up arrangements: Yes  Individual responsible for coordination of the follow-up plan: SANDY-WIFE & PATIENT  Confirmed correct DME delivered: Elease Hashimoto 06/01/2016    Elease Hashimoto

## 2016-06-01 NOTE — Progress Notes (Signed)
Patient is going home with his wife.Thay discussed the discharged instructions with Algis Liming ,PA

## 2016-06-04 DIAGNOSIS — F17210 Nicotine dependence, cigarettes, uncomplicated: Secondary | ICD-10-CM | POA: Diagnosis not present

## 2016-06-04 DIAGNOSIS — I10 Essential (primary) hypertension: Secondary | ICD-10-CM | POA: Diagnosis not present

## 2016-06-04 DIAGNOSIS — I69354 Hemiplegia and hemiparesis following cerebral infarction affecting left non-dominant side: Secondary | ICD-10-CM | POA: Diagnosis not present

## 2016-06-05 DIAGNOSIS — I10 Essential (primary) hypertension: Secondary | ICD-10-CM | POA: Diagnosis not present

## 2016-06-05 DIAGNOSIS — F17210 Nicotine dependence, cigarettes, uncomplicated: Secondary | ICD-10-CM | POA: Diagnosis not present

## 2016-06-05 DIAGNOSIS — I69354 Hemiplegia and hemiparesis following cerebral infarction affecting left non-dominant side: Secondary | ICD-10-CM | POA: Diagnosis not present

## 2016-06-06 ENCOUNTER — Telehealth: Payer: Self-pay | Admitting: *Deleted

## 2016-06-06 NOTE — Telephone Encounter (Signed)
1st attempt at making a Transitional Call. No answer, left VM to call us as soon as possible to schedule follow up

## 2016-06-07 ENCOUNTER — Telehealth: Payer: Self-pay | Admitting: *Deleted

## 2016-06-07 ENCOUNTER — Encounter: Payer: Self-pay | Admitting: *Deleted

## 2016-06-07 ENCOUNTER — Other Ambulatory Visit: Payer: Self-pay | Admitting: *Deleted

## 2016-06-07 DIAGNOSIS — I69354 Hemiplegia and hemiparesis following cerebral infarction affecting left non-dominant side: Secondary | ICD-10-CM | POA: Diagnosis not present

## 2016-06-07 DIAGNOSIS — I10 Essential (primary) hypertension: Secondary | ICD-10-CM | POA: Diagnosis not present

## 2016-06-07 DIAGNOSIS — F17210 Nicotine dependence, cigarettes, uncomplicated: Secondary | ICD-10-CM | POA: Diagnosis not present

## 2016-06-07 NOTE — Patient Outreach (Signed)
Johnathan [name]) Care Management  06/07/2016  RANIEL DIGGES 1951/09/30 [id number]  Subjective: Telephone call to patient's home number, spoke with patient's wife, states patient had a bad night, and is currently asleep.   Left HIPAA compliant message with wife for patient and request call back.    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 Patient currently inpatient rehab at [facility] since 05/16/16. 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 Patient for tentative discharge from inpatient rehab on 06/01/16.   Assessment: Received [facility] Transition of care referral on 05/21/16. Transition of care follow up pending, patient contact.   Plan: [name] will call patient within 2 weeks, for transition of care follow up, if no return call.    Marylene Masek H. Annia Friendly, BSN, [place] [facility] Telephonic CM Phone: [phone] Fax: [phone]

## 2016-06-07 NOTE — Telephone Encounter (Signed)
Physical therapist called asking for homehealth orders 2week2.  Verbal orders given per office protocol

## 2016-06-08 ENCOUNTER — Other Ambulatory Visit: Payer: Self-pay | Admitting: *Deleted

## 2016-06-08 DIAGNOSIS — I63239 Cerebral infarction due to unspecified occlusion or stenosis of unspecified carotid arteries: Secondary | ICD-10-CM

## 2016-06-08 DIAGNOSIS — F17210 Nicotine dependence, cigarettes, uncomplicated: Secondary | ICD-10-CM | POA: Diagnosis not present

## 2016-06-08 DIAGNOSIS — I10 Essential (primary) hypertension: Secondary | ICD-10-CM | POA: Diagnosis not present

## 2016-06-08 DIAGNOSIS — I69354 Hemiplegia and hemiparesis following cerebral infarction affecting left non-dominant side: Secondary | ICD-10-CM | POA: Diagnosis not present

## 2016-06-08 NOTE — Patient Outreach (Signed)
Johnathan [name]) Care Management  06/08/2016  Johnathan [name] 1951/08/09 [id number]   Subjective: Received return telephone call from patient on 06/07/16.    Patient verified HIPAA and gave verbal authorization for [name] to speak with wife [name]) regarding his healthcare needs as needed.   Discussed [name] Care Management services and patient in agreement to receive services.  Appointment scheduled with patient for call back on 06/08/16 to complete transition of care follow up and screening.  Telephone call to patient's home number on 06/08/16, spoke with patient and patient's wife.  Patient verified HIPAA.    Patient states he has an appointment with his new primary MD (Dr. Moreen Fowler) on 8/87/17.   States he has the following durable medical equipment: rolling walker, cane, wheel chair , and bedside commode.  Patient states he was discharged from inpatient rehab on 06/01/16, with home health services through [facility] [facility].  Patient in agreement to a referral to [facility] for home visit, transition of care due to recent hospitalization, inpatient rehab stay, newly diagnosed hypertension and CVA.   Patient will continue to receive [facility] services.  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 Patient currently inpatient rehab at [facility] since 05/16/16. 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 Patient for tentative discharge from inpatient rehab on 06/01/16.   Assessment: Received [facility] Transition of care referral on 05/21/16. Telephonic [name] Transition of care follow up  completed.   Transition of care home visit pending.    Plan: [name] will refer patient to [facility] [facility] [facility] [name] for home visit, transition of care due to recent hospitalization, inpatient rehab stay, newly diagnosed  hypertension and CVA.   Tyrah Broers H. Annia Friendly, BSN, [place] [name] Telephonic CM Phone: [phone] Fax: [phone]

## 2016-06-11 ENCOUNTER — Encounter: Payer: Self-pay | Admitting: Physical Medicine & Rehabilitation

## 2016-06-11 ENCOUNTER — Ambulatory Visit (HOSPITAL_BASED_OUTPATIENT_CLINIC_OR_DEPARTMENT_OTHER): Payer: 59 | Admitting: Physical Medicine & Rehabilitation

## 2016-06-11 ENCOUNTER — Encounter: Payer: 59 | Attending: Physical Medicine & Rehabilitation

## 2016-06-11 VITALS — BP 152/90 | HR 68

## 2016-06-11 DIAGNOSIS — I69354 Hemiplegia and hemiparesis following cerebral infarction affecting left non-dominant side: Secondary | ICD-10-CM | POA: Insufficient documentation

## 2016-06-11 DIAGNOSIS — I69359 Hemiplegia and hemiparesis following cerebral infarction affecting unspecified side: Secondary | ICD-10-CM | POA: Diagnosis not present

## 2016-06-11 DIAGNOSIS — Z5189 Encounter for other specified aftercare: Secondary | ICD-10-CM | POA: Insufficient documentation

## 2016-06-11 DIAGNOSIS — Z95828 Presence of other vascular implants and grafts: Secondary | ICD-10-CM | POA: Diagnosis not present

## 2016-06-11 DIAGNOSIS — R269 Unspecified abnormalities of gait and mobility: Secondary | ICD-10-CM

## 2016-06-11 DIAGNOSIS — Z7982 Long term (current) use of aspirin: Secondary | ICD-10-CM | POA: Diagnosis not present

## 2016-06-11 DIAGNOSIS — I63031 Cerebral infarction due to thrombosis of right carotid artery: Secondary | ICD-10-CM

## 2016-06-11 DIAGNOSIS — I69398 Other sequelae of cerebral infarction: Secondary | ICD-10-CM | POA: Diagnosis not present

## 2016-06-11 MED ORDER — RAMIPRIL 2.5 MG PO CAPS: 2.5000 mg | ORAL_CAPSULE | Freq: Every day | ORAL | Status: DC

## 2016-06-11 MED FILL — RAMIPRIL 2.5 MG CAPSULE: 2.5 | 30 days supply | Qty: 30 | Fill #0

## 2016-06-11 NOTE — Progress Notes (Signed)
Subjective:    Patient ID: Johnathan [name], male    DOB: March 24, 1951, 65 y.o.   MRN: [id number] 65 year old right-handed male, history of tobacco abuse, on no prescription medications. He is married. Lives with his wife. He formally worked at [employer]. Presented on 05/12/2016 with left-sided weakness. Initial CT scan showed no definite acute findings. MRI of the brain showed extensive acute subacute infarct involving the posterior right MCA territory. Additional involvement of the medial posterior right frontal lobe along the posterior cingulate gyrus. Remote infarct in the right occipital lobe. CT angio of the head and neck showed occlusion of the right ICA in the bulb. Interventional Radiology consulted, underwent stenting of right ICA. Remained on ventilator for a short time. Echocardiogram with ejection fraction of 60%. No wall motion abnormalities. No emboli. The patient did not receive tPA. Neurology Services consulted, maintained on aspirin, Plavix therapy. Tolerating a regular diet. HPI   Since discharge to home the patient has started with home health PT and OT. He is now ambulating with a straight cane. He has had no falls other than rolling out of bed once. He had no injuries. He has a new primary care physician and will see him on 07/09/2016. The patient also has a neurology appointment scheduled. Wife is with him today she is transporting him to appointments. She is requesting FMLA papers to be filled out Pain Inventory Average Pain 0 Pain Right Now 0 My pain is tingling  In the last 24 hours, has pain interfered with the following? General activity 0 Relation with others 0 Enjoyment of life 0 What TIME of day is your pain at its worst? none Sleep (in general) Poor  Pain is worse with: none Pain improves with: NA Relief from Meds: NA  Mobility use a cane use a walker how many minutes can you walk? 20 ability to climb steps?  yes do  you drive?  no Do you have any goals in this area?  yes  Function retired  Neuro/Psych numbness tingling trouble walking anxiety  Prior Studies Any changes since last visit?  no  Physicians involved in your care Any changes since last visit?  no   History reviewed. No pertinent family history. Social History   Social History  . Marital Status: Married    Spouse Name: N/A  . Number of Children: N/A  . Years of Education: N/A   Social History Main Topics  . Smoking status: Current Every Day Smoker -- 1.00 packs/day    Types: Cigarettes  . Smokeless tobacco: None  . Alcohol Use: No  . Drug Use: No  . Sexual Activity: Not Asked   Other Topics Concern  . None   Social History Narrative   Past Surgical History  Procedure Laterality Date  . Leg surgery    . Radiology with anesthesia N/A 05/12/2016    Procedure: RADIOLOGY WITH ANESTHESIA;  Surgeon: Luanne Bras, MD;  Location: [facility];  Service: Radiology;  Laterality: N/A;   Past Medical History  Diagnosis Date  . Stroke ([place])    BP 152/90 mmHg  Pulse 68  SpO2 98%  Opioid Risk Score:   Fall Risk Score:  `1  Depression screen PHQ 2/9  Depression screen PHQ 2/9 06/11/2016  Decreased Interest 3  Down, Depressed, Hopeless 2  PHQ - 2 Score 5  Altered sleeping 0  Tired, decreased energy 3  Change in appetite 0  Feeling bad or failure about yourself  2  Trouble concentrating 0  Moving slowly or fidgety/restless 0  Suicidal thoughts 0  PHQ-9 Score 10      Review of Systems  Constitutional: Negative.   HENT: Negative.   Eyes: Negative.   Respiratory: Negative.   Cardiovascular: Negative.   Endocrine: Negative.   Genitourinary: Negative.   Musculoskeletal: Positive for gait problem.  Skin: Negative.   Allergic/Immunologic: Negative.   Neurological: Positive for numbness.  Hematological: Negative.   Psychiatric/Behavioral: Negative.        Objective:   Physical Exam  Constitutional: He is  oriented to person, place, and time. He appears well-developed and well-nourished.  HENT:  Head: Normocephalic and atraumatic.  Eyes: Conjunctivae and EOM are normal. Pupils are equal, round, and reactive to light.  Neck: Normal range of motion.  Cardiovascular: Normal rate, regular rhythm and normal heart sounds.   Pulmonary/Chest: Effort normal and breath sounds normal. No respiratory distress. He has no wheezes.  Abdominal: Soft. Bowel sounds are normal. He exhibits no distension. There is no tenderness.  Neurological: He is alert and oriented to person, place, and time. He displays no atrophy. A sensory deficit is present. Coordination and gait abnormal.  Motor strength is 5/5 in the right deltoid, biceps, triceps, grip, hip flexor, knee extensor, ankle dorsi flexor and plantar flexor 3+ in the left deltoid, biceps, triceps, grip 4 minus in the left hip flexor and extensor ankle dorsiflexor  Ambulates with a cane wide basis support cocontraction of quads and hamstrings in the left lower extremity  Reduced pinprick sensation in the left upper extremity, reduced proprioception in the left upper extremity, intact light touch sensation in the left upper extremity  Psychiatric: He has a normal mood and affect.  Nursing note and vitals reviewed.         Assessment & Plan:  1.Posterior right MCA distribution infarct status post right ICA stenting currently on Plavix and aspirin. Has residual left-sided weakness as well as left hemisensory deficits. Continue home health PT and OT. Anticipate transition to outpatient therapy next month. Wife will need FMLA to transport.  Patient is to continue aspirin and Plavix and follow up with neurology.  Patient will also need follow-up with interventional radiology as determined by neurology.  2. Hypertension, did not have consistent elevation while in the hospital but blood pressures have been climbing according to wife.. They have been measured  at home as well as with home health nursing. Because of this would start antihypertensive medication we'll start low-dose Ramipril 2.5 mg per day I discussed with his wife that if blood pressures fall below [id number] systolic and the patient becomes dizzy this should be stopped and our office should be notified. If that is the case will likely reduce to one half tablet  He will follow-up and this problem will be managed by primary care once he establishes contact. That should be in approximately 1 month  Return to clinic physical medicine rehabilitation 1 month

## 2016-06-11 NOTE — Patient Instructions (Signed)
If blood pressure drops below 100 for the top number and you are dizzy stop ramipril and call my office

## 2016-06-12 DIAGNOSIS — I10 Essential (primary) hypertension: Secondary | ICD-10-CM | POA: Diagnosis not present

## 2016-06-12 DIAGNOSIS — F17210 Nicotine dependence, cigarettes, uncomplicated: Secondary | ICD-10-CM | POA: Diagnosis not present

## 2016-06-12 DIAGNOSIS — I69354 Hemiplegia and hemiparesis following cerebral infarction affecting left non-dominant side: Secondary | ICD-10-CM | POA: Diagnosis not present

## 2016-06-13 ENCOUNTER — Other Ambulatory Visit: Payer: Self-pay | Admitting: *Deleted

## 2016-06-13 NOTE — Patient Outreach (Signed)
Johnathan [name]) [name] Management  06/13/2016  Johnathan [name] 06-24-1951 [id number]  Subjective: Case discussed with Assistant Director of Johnathan [name]) on 06/11/16 and 06/13/16.   Telephone call to [facility] [phone]) spoke with Johnathan [name], verified Johnathan's name, date of birth, and address.  States Johnathan is currently being managed out of [name].   States Johnathan has the following home health [name]: Registered Nurse, Physical Therapy, and Occupational Therapy.   States had start of [name] visit with nurse on 06/04/16 and follow up visit on  06/08/16.   States Johnathan's next nurse visit is on 06/13/16.    Transferred to the [name] and spoke with Johnathan [name].     Johnathan [name] transferred Johnathan [name] to Johnathan [name] (Johnathan [name],  [phone] x [phone]).   Spoke with Johnathan [name], advised of [name] [name], [name] coordination role, and gave verbal update on [facility] follow up plan for Johnathan [name] home visit referral.    Johnathan [name] states she is appreciative of the explanation and outreach call.   States Johnathan does not have good continuity of [name] with home health nursing [name] at this time due to staffing shortage and will ask the nurse that will see Johnathan today to call this Johnathan [name] with an update.   Johnathan [name] reviewed Johnathan's current nursing plan of [name] and advised Johnathan [name] of the following: Johnathan has had 2 visits by 2 different nurses and 3rd  nurse will see the Johnathan today.    States Johnathan plan of [name] is very generic, no goals pertaining to high blood pressure, Johnathan's blood pressure reading on 06/04/16 was 160 /98.   States Johnathan is wanting to stop smoking, has left side weakness, and continues to drag left foot with ambulation.   Johnathan [name] states she or her co-worker (Johnathan [name], Johnathan [name] [phone] x [phone]) is available for questions.   Johnathan will continue to receive Johnathan [name].    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 Johnathan currently inpatient rehab at [name] since 05/16/16. 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 Johnathan for tentative discharge from inpatient rehab on 06/01/16.  Johnathan has [name] follow up visit with Johnathan [name] (rehab MD) on 06/11/16 and started on blood pressure medication due to increase blood pressures since [name] discharge.   Assessment: Received Johnathan [name] referral on 05/21/16. Telephonic Johnathan [name] Transition of [name] follow up completed. Transition of [name] home visit pending.    Plan: Johnathan has been referred to [name] for home visit, transition of [name] follow up due to recent hospitalization, inpatient rehab stay, newly diagnosed hypertension and CVA.   Johnathan [name], BSN, [place] [facility] Telephonic CM Phone: [phone] Fax: [phone]

## 2016-06-14 ENCOUNTER — Telehealth: Payer: Self-pay

## 2016-06-14 ENCOUNTER — Other Ambulatory Visit: Payer: Self-pay | Admitting: *Deleted

## 2016-06-14 DIAGNOSIS — F17210 Nicotine dependence, cigarettes, uncomplicated: Secondary | ICD-10-CM | POA: Diagnosis not present

## 2016-06-14 DIAGNOSIS — I10 Essential (primary) hypertension: Secondary | ICD-10-CM | POA: Diagnosis not present

## 2016-06-14 DIAGNOSIS — I69354 Hemiplegia and hemiparesis following cerebral infarction affecting left non-dominant side: Secondary | ICD-10-CM | POA: Diagnosis not present

## 2016-06-14 NOTE — Patient Outreach (Addendum)
[facility]) Care Management  06/14/2016  Johnathan [name] 12/18/1950 [id number]  Subjective: Received voicemail from [facility] [phone]), states patient's blood pressure during home health visit was 120 /72.    States per patient's wife blood pressure was 144/102 earlier today.   Nurse request call back from [facility] if any additional questions regarding home health services.  Patient will continue to receive [place] Management services.      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 Patient currently inpatient rehab at [facility] since 05/16/16. 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 Patient for tentative discharge from inpatient rehab on 06/01/16. Patient has hospital follow up visit with Dr. Lynford Citizen (rehab MD) on 06/11/16 and started on blood pressure medication due to increase blood pressures since hospital discharge.   Assessment: Received [facility] Transition of care referral on 05/21/16. Telephonic [name] Transition of care follow up completed. Transition of care home visit pending.    Plan: Patient has been referred to [place] for home visit, transition of care follow up due to recent hospitalization, inpatient rehab stay, newly diagnosed hypertension and CVA. [name] will send [facility] home health nurse update to [place] for follow up.    Gabby Rackers H. Annia Friendly, BSN, [place] Management [facility] Telephonic CM Phone: [phone] Fax: [phone]

## 2016-06-14 NOTE — Telephone Encounter (Signed)
Cindy-RN from [place] [facility] that pt is questioning about some sleep medications. She states that Celexa was mentioned, however he is currently taking Xanax. He would like a refill for xanax or something else to help him sleep. Please advise. Pt can be contacted at [phone].

## 2016-06-15 ENCOUNTER — Other Ambulatory Visit: Payer: Self-pay | Admitting: *Deleted

## 2016-06-15 NOTE — Patient Outreach (Signed)
[facility]) Care Management  06/15/2016  Johnathan [name] August 23, 1951 [id number]   RN attempted outreach call today home/ mobile number however only able to leave a HIPAA voice message. Will continue outreach calls accordingly. Note RN received message today the the [facility] nurse called and reported pt's BP at 120/72. Contact number left if this RN needed to reach the involved [place] [phone]. RN will continue outreach attempt and communication with the involved [place] accordingly.  [name], RN Care Management Coordinator [place] Office [phone]

## 2016-06-18 ENCOUNTER — Other Ambulatory Visit: Payer: Self-pay | Admitting: *Deleted

## 2016-06-18 DIAGNOSIS — I10 Essential (primary) hypertension: Secondary | ICD-10-CM | POA: Diagnosis not present

## 2016-06-18 DIAGNOSIS — F17210 Nicotine dependence, cigarettes, uncomplicated: Secondary | ICD-10-CM | POA: Diagnosis not present

## 2016-06-18 DIAGNOSIS — I69354 Hemiplegia and hemiparesis following cerebral infarction affecting left non-dominant side: Secondary | ICD-10-CM | POA: Diagnosis not present

## 2016-06-18 NOTE — Patient Outreach (Signed)
[name]) Care Management  06/18/2016  Johnathan [name] 09/20/1951 [id number]  Transition of care call attempted once again today and RN introduced the [facility] program and purpose for today's call. RN spoke with pt and received permission to speak with his primary caregiver (wife-Sandy). Pt reports he is doing much better and wife confirms however reports pt's BP continues to fluctuate in the AM and PM with the highest at 153/104 and the lowest at 118/72 over the last week. Pt  asymptmatic with these readings and recently visited with Dr. Daneil Dan (rehabiliation provider) who strarted treating pt's for his BP until pt's pending visit with his new primary provider Dr. Moreen Fowler. Pt and spouse aware to seek medical attention if pt's BP elevated to high in the range of 200 as informed by the [facility] . RN discussed HTN and the risk of not monitoring his BP daily or considering the factors that may increase his blood pressures such as stressors, dietary habits and other enviormental factors such as smoking or second hand smoke (verbalized an understanding). RN offered a community home visit to assist pt further in managing his BP however declined however agreed to ongoing telephonic follow up appointments/transition of care calls are accepted. RN strongly encouraged pt and wife to monitor pt's BP daily and report any abnormal readings especially if symptoms are involved. Also discussed was a possible specialist related to pt's BP such as a cardiologist which wife has been anticipating and thinking about considering. Discussed possible referral needed if pt wishes to obtain a cardiologist and to discuss this request with the current provided managing pt's blood pressure. Due to the extend transition of care calls that have taken place will extend an additional 2 weeks based upon the request of the pt's wife. Plan of care discussed with keeping pt out of the hospital over the next 2 weeks with will be  over 31 days since his last discharge form the inpt rehabilitation, taking all medications as prescribed and attending all scheduled medical appointments pending new primary Dr. Moreen Fowler. Pt with understanding and RN will follow up over the next few weeks with transition of care calls weekly as pt continues to manage his care. Pt and caregiver very pleased and agreed with the follow up calls.   [name], RN Care Management Coordinator [place] Office [phone]

## 2016-06-19 ENCOUNTER — Encounter: Payer: Self-pay | Admitting: *Deleted

## 2016-06-19 DIAGNOSIS — Z006 Encounter for examination for normal comparison and control in clinical research program: Secondary | ICD-10-CM

## 2016-06-19 NOTE — Telephone Encounter (Signed)
I do not advise Xanax for patients who have had recent stroke, increases risk of falls

## 2016-06-19 NOTE — Telephone Encounter (Signed)
Trazodone 50 mg daily at bedtime, #30 one refill

## 2016-06-19 NOTE — Telephone Encounter (Signed)
Mr Hartsell has the trazodone.  They are asking about the alprazolam that was prescribed by Pam at discharge.Marland Kitchen  He runs out tomorrow.  She says he has panic attacks. He does not see his PCP until September. He sees you in the office 07/09/16

## 2016-06-19 NOTE — Progress Notes (Signed)
STROKE-AF Research study 1 month follow up completed over phone after patient decided to withdrawal from study. He is very willing to allow [place] to follow his medical records and call him for the remaining study required visits. Today I speak with both him and his wife. He staes he is feeling about 55% and still needs help with self care, dressing and walking (@ times) Modifies Rankin Scale = 3. Ramipril 2.5 mg daily oral has been added to his medications since discharge. He denies any symptoms. Questions encouraged and answered. Next research phone call will be late November.

## 2016-06-20 DIAGNOSIS — I69354 Hemiplegia and hemiparesis following cerebral infarction affecting left non-dominant side: Secondary | ICD-10-CM | POA: Diagnosis not present

## 2016-06-20 DIAGNOSIS — F17210 Nicotine dependence, cigarettes, uncomplicated: Secondary | ICD-10-CM | POA: Diagnosis not present

## 2016-06-20 DIAGNOSIS — I10 Essential (primary) hypertension: Secondary | ICD-10-CM | POA: Diagnosis not present

## 2016-06-20 NOTE — Telephone Encounter (Signed)
Notified about the xanax.  Wife is asking about the celex that was discussed.  It was not ordered.

## 2016-06-21 MED ORDER — CITALOPRAM HYDROBROMIDE 10 MG PO TABS: 10.0000 mg | ORAL_TABLET | Freq: Every day | ORAL | Status: DC

## 2016-06-21 MED FILL — CITALOPRAM HBR 10 MG TABLET: 10 | 30 days supply | Qty: 30 | Fill #0

## 2016-06-21 NOTE — Telephone Encounter (Signed)
Order placed and wife notified.  

## 2016-06-21 NOTE — Telephone Encounter (Signed)
May order Celexa 10 mg daily #30 one refill

## 2016-06-25 DIAGNOSIS — F17210 Nicotine dependence, cigarettes, uncomplicated: Secondary | ICD-10-CM | POA: Diagnosis not present

## 2016-06-25 DIAGNOSIS — I10 Essential (primary) hypertension: Secondary | ICD-10-CM | POA: Diagnosis not present

## 2016-06-25 DIAGNOSIS — I69354 Hemiplegia and hemiparesis following cerebral infarction affecting left non-dominant side: Secondary | ICD-10-CM | POA: Diagnosis not present

## 2016-06-26 ENCOUNTER — Other Ambulatory Visit: Payer: Self-pay | Admitting: *Deleted

## 2016-06-26 ENCOUNTER — Ambulatory Visit: Payer: 59 | Admitting: *Deleted

## 2016-06-26 NOTE — Patient Outreach (Signed)
[facility]) Care Management  06/26/2016  Johnathan [name] [birth year] [id number]   RN attempted outreach call however unsuccessful with no answer. Will continue outreach contacts accordingly for ongoing transition of care contacts.   [name], RN Care Management Coordinator [place] Office [phone]

## 2016-06-27 DIAGNOSIS — F17210 Nicotine dependence, cigarettes, uncomplicated: Secondary | ICD-10-CM | POA: Diagnosis not present

## 2016-06-27 DIAGNOSIS — I69354 Hemiplegia and hemiparesis following cerebral infarction affecting left non-dominant side: Secondary | ICD-10-CM | POA: Diagnosis not present

## 2016-06-27 DIAGNOSIS — I10 Essential (primary) hypertension: Secondary | ICD-10-CM | POA: Diagnosis not present

## 2016-06-28 ENCOUNTER — Other Ambulatory Visit: Payer: Self-pay | Admitting: Physical Medicine and Rehabilitation

## 2016-06-28 ENCOUNTER — Other Ambulatory Visit: Payer: Self-pay | Admitting: *Deleted

## 2016-06-28 NOTE — Patient Outreach (Signed)
[facility]) Care Management  06/28/2016  Johnathan [name] [birth year] [id number]   RN attempted to reach pt once again however unsuccessful. Will continue one additional outreach contact next week prior to case closure. Will also attempt outreach to [facility] [phone]) prior to this discharge if no response. RN able to leave a HIPAA approved voice message requesting a call back. Will inquire further at that time on pt's ongoing recovery.  [name], RN Care Management Coordinator [place] Office [phone]

## 2016-06-29 MED FILL — traZODone HCL 50 MG TABS: 50 | 30 days supply | Qty: 30 | Fill #0

## 2016-06-29 MED FILL — CLOPIDOGREL 75 MG TABLET: 75 | 30 days supply | Qty: 30 | Fill #0

## 2016-06-29 MED FILL — PANTOPRAZOLE SOD DR 40 MG T: 40 | 30 days supply | Qty: 30 | Fill #0

## 2016-06-29 MED FILL — ATORVASTATIN 10 MG TABLET: 10 | 30 days supply | Qty: 30 | Fill #0

## 2016-07-02 DIAGNOSIS — I10 Essential (primary) hypertension: Secondary | ICD-10-CM | POA: Diagnosis not present

## 2016-07-02 DIAGNOSIS — F17210 Nicotine dependence, cigarettes, uncomplicated: Secondary | ICD-10-CM | POA: Diagnosis not present

## 2016-07-02 DIAGNOSIS — I69354 Hemiplegia and hemiparesis following cerebral infarction affecting left non-dominant side: Secondary | ICD-10-CM | POA: Diagnosis not present

## 2016-07-03 ENCOUNTER — Telehealth (HOSPITAL_COMMUNITY): Payer: Self-pay

## 2016-07-03 NOTE — Telephone Encounter (Signed)
Returned phone call to [employer], no answer, no vm. Will try back later to schedule f/u. AW

## 2016-07-04 ENCOUNTER — Encounter: Payer: Self-pay | Admitting: *Deleted

## 2016-07-04 ENCOUNTER — Other Ambulatory Visit: Payer: Self-pay | Admitting: *Deleted

## 2016-07-04 NOTE — Patient Outreach (Addendum)
[name]) Care Management  07/04/2016  Johnathan [name] Johnathan [name] 20, 1952 [id number]   RN spoke with pt's primary caregiver today (spouse Johnathan [name]) who reports pt is doing well with no acute issues. States pt continues to work with PT via [place] with great progress. Reports recent BP from 149/99- 114/77 with no reported signs or symptoms of distress. Wife continues to feel pt can manage his ongoing medical condition independently and continues to opt to declined community case management services with this RN case manager via [facility] services. Based upon today's transition of care call will be ending and RN will notify Dr. Moreen [name] of the above disposition with this pt. RN has provided a contact source via [facility] if services are needed in the future. Plan of care discussed with all goals that have been met and verified all medications with no reported issues or delays and pt has a sufficient supply of refills. No other inquires or request at this time as this case will be closed. Spouse expressed how grateful she was for the ongoing follow up calls and services provided.   Johnathan Mina, RN Care Management Coordinator [place] Office [phone]

## 2016-07-09 ENCOUNTER — Ambulatory Visit (HOSPITAL_BASED_OUTPATIENT_CLINIC_OR_DEPARTMENT_OTHER): Payer: 59 | Admitting: Physical Medicine & Rehabilitation

## 2016-07-09 ENCOUNTER — Encounter: Payer: 59 | Attending: Physical Medicine & Rehabilitation

## 2016-07-09 ENCOUNTER — Other Ambulatory Visit: Payer: Self-pay | Admitting: *Deleted

## 2016-07-09 ENCOUNTER — Encounter: Payer: Self-pay | Admitting: Physical Medicine & Rehabilitation

## 2016-07-09 VITALS — BP 145/85 | HR 64 | Resp 14

## 2016-07-09 DIAGNOSIS — I10 Essential (primary) hypertension: Secondary | ICD-10-CM | POA: Diagnosis not present

## 2016-07-09 DIAGNOSIS — I69359 Hemiplegia and hemiparesis following cerebral infarction affecting unspecified side: Secondary | ICD-10-CM | POA: Diagnosis not present

## 2016-07-09 DIAGNOSIS — I63031 Cerebral infarction due to thrombosis of right carotid artery: Secondary | ICD-10-CM | POA: Diagnosis not present

## 2016-07-09 DIAGNOSIS — Z95828 Presence of other vascular implants and grafts: Secondary | ICD-10-CM | POA: Diagnosis not present

## 2016-07-09 DIAGNOSIS — I69398 Other sequelae of cerebral infarction: Secondary | ICD-10-CM

## 2016-07-09 DIAGNOSIS — R269 Unspecified abnormalities of gait and mobility: Secondary | ICD-10-CM | POA: Diagnosis not present

## 2016-07-09 DIAGNOSIS — Z5189 Encounter for other specified aftercare: Secondary | ICD-10-CM | POA: Insufficient documentation

## 2016-07-09 DIAGNOSIS — I6930 Unspecified sequelae of cerebral infarction: Secondary | ICD-10-CM | POA: Diagnosis not present

## 2016-07-09 DIAGNOSIS — I69354 Hemiplegia and hemiparesis following cerebral infarction affecting left non-dominant side: Secondary | ICD-10-CM | POA: Insufficient documentation

## 2016-07-09 DIAGNOSIS — E7800 Pure hypercholesterolemia, unspecified: Secondary | ICD-10-CM | POA: Diagnosis not present

## 2016-07-09 DIAGNOSIS — G4700 Insomnia, unspecified: Secondary | ICD-10-CM | POA: Diagnosis not present

## 2016-07-09 DIAGNOSIS — Z7982 Long term (current) use of aspirin: Secondary | ICD-10-CM | POA: Diagnosis not present

## 2016-07-09 DIAGNOSIS — R5383 Other fatigue: Secondary | ICD-10-CM | POA: Diagnosis not present

## 2016-07-09 DIAGNOSIS — K219 Gastro-esophageal reflux disease without esophagitis: Secondary | ICD-10-CM | POA: Diagnosis not present

## 2016-07-09 DIAGNOSIS — F324 Major depressive disorder, single episode, in partial remission: Secondary | ICD-10-CM | POA: Diagnosis not present

## 2016-07-09 MED FILL — RAMIPRIL 5 MG CAPSULE: 5 | 90 days supply | Qty: 90 | Fill #0

## 2016-07-09 NOTE — Patient Instructions (Signed)
Referral to outpatient PT, OT

## 2016-07-09 NOTE — Progress Notes (Signed)
Subjective:    Patient ID: Johnathan [name], male    DOB: 08/31/1951, 65 y.o.   MRN: [id number]  HPI Patient returns today after his initial follow-up visit one month ago. He is no longer using any assisted device to ambulate. However, still has decreased balance. No falls at home. Has not done any yardwork such as lawn mowing. He gets fatigued easily. He denies any shortness of breath. He has followed up with his new primary care physician, Dr. Antony Contras.  No new hospitalizations. Not driving He is retired   Pain Inventory Average Pain 2 Pain Right Now 0 My pain is tingling and aching  In the last 24 hours, has pain interfered with the following? General activity 2 Relation with others 0 Enjoyment of life 0 What TIME of day is your pain at its worst? evening Sleep (in general) Good  Pain is worse with: sitting Pain improves with: rest Relief from Meds: n/a  Mobility walk without assistance how many minutes can you walk? 20-30 ability to climb steps?  yes do you drive?  no  Function retired I need assistance with the following:  dressing and household duties  Neuro/Psych tingling depression anxiety  Prior Studies Any changes since last visit?  no  Physicians involved in your care Any changes since last visit?  yes Primary care Johnathan [name]   History reviewed. No pertinent family history. Social History   Social History  . Marital status: Married    Spouse name: N/A  . Number of children: N/A  . Years of education: N/A   Social History Main Topics  . Smoking status: Current Every Day Smoker    Packs/day: 1.00    Types: Cigarettes  . Smokeless tobacco: Never Used  . Alcohol use No  . Drug use: No  . Sexual activity: Not Asked   Other Topics Concern  . None   Social History Narrative  . None   Past Surgical History:  Procedure Laterality Date  . LEG SURGERY    . RADIOLOGY WITH ANESTHESIA N/A 05/12/2016   Procedure: RADIOLOGY WITH  ANESTHESIA;  Surgeon: Luanne Bras, MD;  Location: [place];  Service: Radiology;  Laterality: N/A;   Past Medical History:  Diagnosis Date  . Stroke ([place])    BP (!) 145/85 (BP Location: Right Arm, Patient Position: Sitting, Cuff Size: Large)   Pulse 64   Resp 14   SpO2 97%   Opioid Risk Score:   Fall Risk Score:  `1  Depression screen PHQ 2/9  Depression screen PHQ 2/9 06/11/2016  Decreased Interest 3  Down, Depressed, Hopeless 2  PHQ - 2 Score 5  Altered sleeping 0  Tired, decreased energy 3  Change in appetite 0  Feeling bad or failure about yourself  2  Trouble concentrating 0  Moving slowly or fidgety/restless 0  Suicidal thoughts 0  PHQ-9 Score 10    Review of Systems  Constitutional: Negative.   HENT: Negative.   Respiratory: Positive for shortness of breath.   Endocrine: Negative.   Genitourinary: Negative.   Allergic/Immunologic: Negative.   Neurological: Negative.        Tingling  Hematological: Bruises/bleeds easily.  Psychiatric/Behavioral: Positive for dysphoric mood. The patient is nervous/anxious.   All other systems reviewed and are negative.      Objective:   Physical Exam  Constitutional: He is oriented to person, place, and time. He appears well-developed and well-nourished.  HENT:  Head: Normocephalic and atraumatic.  Eyes: Conjunctivae and EOM  are normal. Pupils are equal, round, and reactive to light.  Visual fields are intact confrontation testing  Neck: Normal range of motion.  Neurological: He is alert and oriented to person, place, and time. Coordination and gait abnormal.  Reflex Scores:      Tricep reflexes are 2+ on the right side and 3+ on the left side.      Bicep reflexes are 2+ on the right side and 3+ on the left side.      Brachioradialis reflexes are 2+ on the right side and 3+ on the left side.      Patellar reflexes are 2+ on the right side and 3+ on the left side.      Achilles reflexes are 2+ on the right side and 3+  on the left side. Psychiatric: He has a normal mood and affect.  Nursing note and vitals reviewed.  Motor strength is 4/5 in the left deltoid, biceps, triceps, grip 4+ at the left hip flexor, knee extensor 4 minus at the left ankle dorsiflexor, plantar flexor. Sensation is reduced to light touch as well as pinprick in the left upper and left lower limb. His coordination is decreased with fine motor finger-to thumb on the left side.  Visual fields are intact confrontation testing. No evidence of neglect. At the current time        Assessment & Plan:  1. 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 His neglect has improved. His left hemiparesis is still affecting him functionally for higher level balance as well as coordination  Would recommend outpatient PT and OT Return to clinic in one month

## 2016-07-09 NOTE — Patient Outreach (Signed)
[facility]) Care Management  07/09/2016  Johnathan [name] Dec 12, 1950 [id number]   RN attempted to contact the involved [name] concerning pt's disposition with [facility] however unsuccessful. RN only able to leave a HIPAA approved voice message requesting a call back. Will update accordingly as pt declined home visit but RN able to completed transition of care calls per [facility] protocol.   [name], RN Care Management Coordinator [place] Office [phone]

## 2016-07-27 MED FILL — traZODone HCL 50 MG TABS: 50 | 90 days supply | Qty: 90 | Fill #0

## 2016-07-27 MED FILL — ATORVASTATIN 10 MG TABLET: 10 | 90 days supply | Qty: 90 | Fill #0

## 2016-07-27 MED FILL — CLOPIDOGREL 75 MG TABLET: 75 | 90 days supply | Qty: 90 | Fill #0

## 2016-07-27 MED FILL — PANTOPRAZOLE SOD DR 40 MG T: 40 | 90 days supply | Qty: 90 | Fill #0

## 2016-07-30 ENCOUNTER — Encounter: Payer: Self-pay | Admitting: Neurology

## 2016-07-30 ENCOUNTER — Ambulatory Visit (INDEPENDENT_AMBULATORY_CARE_PROVIDER_SITE_OTHER): Payer: 59 | Admitting: Neurology

## 2016-07-30 VITALS — BP 142/86 | HR 52 | Ht 66.0 in | Wt 166.4 lb

## 2016-07-30 DIAGNOSIS — I63231 Cerebral infarction due to unspecified occlusion or stenosis of right carotid arteries: Secondary | ICD-10-CM

## 2016-07-30 DIAGNOSIS — I639 Cerebral infarction, unspecified: Secondary | ICD-10-CM

## 2016-07-30 NOTE — Patient Instructions (Signed)
I had a long d/w patient and his wife about his recent stroke, risk for recurrent stroke/TIAs, personally independently reviewed imaging studies and stroke evaluation results and answered questions.Continue aspirin 325 mg daily and Plavix 75 mg daily for 2 more weeks and then discontinue aspirin and stay on Plavix alone for secondary stroke prevention and maintain strict control of hypertension with blood pressure goal below 130/90, diabetes with hemoglobin A1c goal below 6.5% and lipids with LDL cholesterol goal below 70 mg/dL. I also advised the patient to eat a healthy diet with plenty of whole grains, cereals, fruits and vegetables, exercise regularly and maintain ideal body weight .patient may resume prior physical activities but I advised him to increase them gradually as tolerated. Followup in the future with me in 6 months or call earlier if necessary   Stroke Prevention Some medical conditions and behaviors are associated with an increased chance of having a stroke. You may prevent a stroke by making healthy choices and managing medical conditions. HOW CAN I REDUCE MY RISK OF HAVING A STROKE?   Stay physically active. Get at least 30 minutes of activity on most or all days.  Do not smoke. It may also be helpful to avoid exposure to secondhand smoke.  Limit alcohol use. Moderate alcohol use is considered to be:  No more than 2 drinks per day for men.  No more than 1 drink per day for nonpregnant women.  Eat healthy foods. This involves:  Eating 5 or more servings of fruits and vegetables a day.  Making dietary changes that address high blood pressure (hypertension), high cholesterol, diabetes, or obesity.  Manage your cholesterol levels.  Making food choices that are high in fiber and low in saturated fat, trans fat, and cholesterol may control cholesterol levels.  Take any prescribed medicines to control cholesterol as directed by your health care provider.  Manage your  diabetes.  Controlling your carbohydrate and sugar intake is recommended to manage diabetes.  Take any prescribed medicines to control diabetes as directed by your health care provider.  Control your hypertension.  Making food choices that are low in salt (sodium), saturated fat, trans fat, and cholesterol is recommended to manage hypertension.  Ask your health care provider if you need treatment to lower your blood pressure. Take any prescribed medicines to control hypertension as directed by your health care provider.  If you are 39-63 years of age, have your blood pressure checked every 3-5 years. If you are 25 years of age or older, have your blood pressure checked every year.  Maintain a healthy weight.  Reducing calorie intake and making food choices that are low in sodium, saturated fat, trans fat, and cholesterol are recommended to manage weight.  Stop drug abuse.  Avoid taking birth control pills.  Talk to your health care provider about the risks of taking birth control pills if you are over 82 years old, smoke, get migraines, or have ever had a blood clot.  Get evaluated for sleep disorders (sleep apnea).  Talk to your health care provider about getting a sleep evaluation if you snore a lot or have excessive sleepiness.  Take medicines only as directed by your health care provider.  For some people, aspirin or blood thinners (anticoagulants) are helpful in reducing the risk of forming abnormal blood clots that can lead to stroke. If you have the irregular heart rhythm of atrial fibrillation, you should be on a blood thinner unless there is a good reason you cannot take  them.  Understand all your medicine instructions.  Make sure that other conditions (such as anemia or atherosclerosis) are addressed. SEEK IMMEDIATE MEDICAL CARE IF:   You have sudden weakness or numbness of the face, arm, or leg, especially on one side of the body.  Your face or eyelid droops to one  side.  You have sudden confusion.  You have trouble speaking (aphasia) or understanding.  You have sudden trouble seeing in one or both eyes.  You have sudden trouble walking.  You have dizziness.  You have a loss of balance or coordination.  You have a sudden, severe headache with no known cause.  You have new chest pain or an irregular heartbeat. Any of these symptoms may represent a serious problem that is an emergency. Do not wait to see if the symptoms will go away. Get medical help at once. Call your local emergency services (911 in [place] Do not drive yourself to the hospital.   This information is not intended to replace advice given to you by your health care provider. Make sure you discuss any questions you have with your health care provider.   Document Released: 12/27/2004 Document Revised: 12/10/2014 Document Reviewed: 05/22/2013 Elsevier Interactive Patient Education [employer].

## 2016-07-31 ENCOUNTER — Ambulatory Visit: Payer: 59 | Admitting: Physical Therapy

## 2016-07-31 ENCOUNTER — Ambulatory Visit: Payer: 59 | Attending: Physical Medicine & Rehabilitation | Admitting: Occupational Therapy

## 2016-07-31 ENCOUNTER — Encounter: Payer: Self-pay | Admitting: Physical Therapy

## 2016-07-31 DIAGNOSIS — R278 Other lack of coordination: Secondary | ICD-10-CM | POA: Insufficient documentation

## 2016-07-31 DIAGNOSIS — R2689 Other abnormalities of gait and mobility: Secondary | ICD-10-CM | POA: Insufficient documentation

## 2016-07-31 DIAGNOSIS — R208 Other disturbances of skin sensation: Secondary | ICD-10-CM | POA: Insufficient documentation

## 2016-07-31 DIAGNOSIS — I69354 Hemiplegia and hemiparesis following cerebral infarction affecting left non-dominant side: Secondary | ICD-10-CM

## 2016-07-31 NOTE — Therapy (Addendum)
[facility] [address] [place], [place], [address] Phone: [phone]   Fax:  [phone]  Occupational Therapy Evaluation  Patient Details  Name: Johnathan [name] MRN: [id number] Date of Birth: 11-Apr-1951 Referring Provider: Dr. Alysia Penna  Encounter Date: 07/31/2016      OT End of Session - 07/31/16 1733    Visit Number 1   Number of Visits 9   Date for OT Re-Evaluation 09/17/16   Authorization Type [facility] / Medicare (g-code needed)   Authorization Time Period cert. 07/31/16-09/29/16   Authorization - Visit Number 1   Authorization - Number of Visits 10   OT Start Time 1405   OT Stop Time 1445   OT Time Calculation (min) 40 min   Activity Tolerance Patient tolerated treatment well   Behavior During Therapy WFL for tasks assessed/performed      Past Medical History:  Diagnosis Date  . Stroke [facility])     Past Surgical History:  Procedure Laterality Date  . knee arthoscopy    . LEG SURGERY    . RADIOLOGY WITH ANESTHESIA N/A 05/12/2016   Procedure: RADIOLOGY WITH ANESTHESIA;  Surgeon: Luanne Bras, MD;  Location: [place];  Service: Radiology;  Laterality: N/A;    There were no vitals filed for this visit.      Subjective Assessment - 07/31/16 1412    Subjective  Pt reports that "he has come a long way"   Patient is accompained by:    Pertinent History CVA affecting L side   Limitations no driving, no use of power tools, decr sensation   Patient Stated Goals to be able to use hand more    Currently in Pain? No/denies           [facility] OT Assessment - 07/31/16 1413      Assessment   Diagnosis R CVA with L hemiparesis   Referring Provider Dr. 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    Leisure likes to work on Engineer, manufacturing systems     ADL   Eating/Feeding Modified independent  difficulty opening packages   Grooming Moderate assistance   Upper Body Bathing Modified independent   Lower Body Bathing Modified independent   Upper Body Dressing Needs assist for fasteners  difficulty with orientation/shirt getting twisted   Lower Body Dressing Needs assist for fasteners   Toilet Tranfer Modified independent   Toileting - Clothing Manipulation Modified independent   Toileting -  Conservation officer, historic buildings bars;Walk in shower  has seat but does not use     IADL   Prior Level of Function Light Housekeeping wife performs most tasks, pt performing some previous tasks with incr time (has not mopped), pt did yardwork but is not currently performing   Prior  Level of Function Meal Prep wife always performed cooking   Meal Prep Able to complete simple cold meal and snack prep   Prior Level of Function Community Mobility drove previously   [employer] on family or friends for transportation   Medication Management Takes responsibility if medication is prepared in advance in seperate dosage  pt checks them   Prior Level of Function Financial Management wife performed     Mobility   Mobility Status Independent     Written Expression   Dominant Hand Right     Vision - History   Baseline Vision Wears glasses only for reading  at times     Cognition   Overall Cognitive Status Within Functional Limits for tasks assessed      Sensation   Light Touch Impaired by gross assessment  per pt    Stereognosis Appears Intact   Hot/Cold Impaired by gross assessment  per pt   Proprioception Impaired by gross assessment   Additional Comments Pt reports crushing cans where drink comes out without realizing it, pt reports difficulty releasing objects, and reports closing hand in doors     Coordination   9 Hole Peg Test Right;Left   Left 9 Hole Peg Test 33.81   Box and Blocks 22.66     ROM / Strength   AROM / PROM / Strength AROM;Strength     AROM   Overall AROM  Within functional limits for tasks performed   Overall AROM Comments BUEs      Strength   Overall Strength Deficits   Overall Strength Comments RUE proximal strength grossly 5/5; L shoulder strength grossly 4-/5, biceps/triceps 5/5     Hand Function   Right Hand Grip (lbs) 96   Left Hand Grip (lbs) 94                         OT Education - 07/31/16 1725    Education provided Yes   Education Details Recommended against use of estim at home as pt has good ROM/strength of L hand; recommended pt incr use of LUE for activities (nothing sharp, heavy, or hot)   Person(s) Educated Patient   Methods Explanation   Comprehension Verbalized understanding             OT Long Term Goals - 07/31/16 1745      OT LONG TERM GOAL #1   Title Pt will be independent with HEP for coordination and L shoulder strength.--check goals 09/29/16   Status New     OT LONG TERM GOAL #2   Title Pt will improve coordination for ADLs as shown by improving time on 9-hole peg test by at least 8sec with L hand.   Baseline 33.81sec   Status New     OT LONG TERM GOAL #3   Title Pt will report incr ease releasing objects, no instances of crushing objects, or closing his hand in door for at least 1 week.   Period Weeks   Status New     OT LONG TERM GOAL #4   Title Pt will be able to complete buttons mod I.   Period Weeks   Status New     OT LONG TERM  GOAL #5   Title Pt will don shirt on first attempt at least 90% of the time.   Status New               Plan - 07/31/16 1735  Clinical Impression Statement Pt is a 65 y.o. male s/p CVA affecting L side.  Pt with no significant PMH prior to CVA.  Pt presents with L hemiparesis with decr coordination, decr sensation, and decr strength affecting LUE functional use.  Pt would benefit from occupational therapy to address these deficits in order improve ability to perform ADLs/IADLs and incr LUE functional use.     Rehab Potential Good   OT Frequency 2x / week   OT Duration --  for 8 visits + eval    OT Treatment/Interventions Self-care/ADL training;Parrafin;Therapeutic exercise;DME and/or AE instruction;Therapeutic activities;Patient/family education;Neuromuscular education;Therapeutic exercises;Passive range of motion;Moist Heat;Fluidtherapy;Manual Therapy;Splinting   Plan HEP for coordination   Consulted and Agree with Plan of Care Patient      Patient will benefit from skilled therapeutic intervention in order to improve the following deficits and impairments:  Decreased coordination, Decreased strength, Impaired UE functional use, Impaired sensation  Visit Diagnosis: Other lack of coordination  Other disturbances of skin sensation  Hemiplegia and hemiparesis following cerebral infarction affecting left non-dominant side (HCC)      G-Codes - Aug 15, 2016 1753    Functional Assessment Tool Used 9-hole peg test L-33.81sec, unable to fasten/unfasten buttons, using too much pressure on objects/difficulty releasing objects   Functional Limitation Carrying, moving and handling objects   Carrying, Moving and Handling Objects Current Status [id number]) At least 20 percent but less than 40 percent impaired, limited or restricted   Carrying, Moving and Handling Objects Goal Status [id number]) At least 1 percent but less than 20 percent impaired, limited or restricted      Problem List Patient  Active Problem List   Diagnosis Date Noted  . Hemiparesis as late effect of cerebrovascular accident (CVA) ([place]) 06/11/2016  . Left-sided weakness 05/16/2016  . Right Carotid artery occlusion with infarction ([place]) 05/16/2016  . Essential hypertension 05/16/2016  . Hyperlipidemia LDL goal <70 05/16/2016  . Cigarette smoker 05/16/2016  . Overweight (BMI 25.0-29.9) 05/16/2016  . 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 Acute respiratory failure ([place]) 05/16/2016  . 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 Gait disturbance, post-stroke   . Hypokalemia   . Hypophosphatemia   . Cerebrovascular accident (CVA) due to thrombosis of right carotid artery [facility]) s/p IV tPA, R ICA stent placement, thrombectomy     [facility] August 15, 2016, 5:56 PM  [place] [address] [place], [place], [address] Phone: [phone]   Fax:  [phone]  Name: JEREMAI RUSSIN MRN: [id number] Date of Birth: [birth year]   Vianne Bulls, OTR/L [facility] [address] [place], [place]  [address] [phone] phone [phone] August 15, 2016 5:56 PM

## 2016-07-31 NOTE — Progress Notes (Signed)
[facility] [address] [place]. [place] [address] [phone]       OFFICE FOLLOW-UP NOTE  [name] Date of Birth:  Apr 28, 1951 Medical Record Number:  [id number]   HPI: Mr Johnathan [name] is a 3 year Caucasian male seen today for first offcie f/u visit after [facility] admission for stroke in June 2017.He is accompanied by his wife today.Johnathan [name] is a 65 y.o. male who was in his normal state of health earlier working outside in the lawn. He had a syncopal episode At 1705 on 05/12/2016 ([facility]KW). EMS was called and arrived on scene for the syncope. After their arrival, at 1728 he developed severe left-sided weakness.They called a code stroke and transported him to the ER where he was found to have a left hemiparesis with neglect. He was given IV tPA and and taken to IR where he had  superselective infusion of 5mg  of tpa and endovascul;ar revascularization of symptomatic occluded RT ICA prox with stent assisted angioplasty ,and infusion of 18 mg of IA Integrelin and 9 mg of Reopro into intrastent thrombosis with gradual clearance of filling defects   He was kept in the intensive care unit and blood pressure was tightly controlled. MRI scan of the brain showed extensive acute and subacute infarcts involving posterior division of the right MCA with slight involvement of the medial aspect of the posterior right frontal lobe and cingulate gyrus. Transthoracic echo showed normal ejection fraction. LDL cholesterol 70 mg percent and hemoglobin A1c was 5.6. Patient was started on dual antiplatelet therapy because of his carotid stent and advised to quit smoking which he has done.Marland Kitchen He was transferred to inpatient rehabilitation for ongoing therapy needs and did well there and has now been discharged home. He is able to walk independently without assistance. He still has mild weakness of his left hand and some residual numbness in his left hand and leg. He states he tires more easily and stamina  is nondistended. He also has some Percocet to deficits in his left hand. He has quit smoking. He is tolerating aspirin and Plavix with minor bruising in his skin but no bleeding episodes. His blood pressure is well controlled though it is borderline today at 142/80. Is tolerating Lipitor without muscle aches or pains. Patient to has finished therapy at home and plans to start outpatient therapy soon.  ROS:   14 system review of systems is positive for  appetite change, fatigue, chills, runny nose, shortness of breath, cold intolerance, restless legs, frequent waking, daytime sleepiness, snoring, easy bruising, headache, numbness, depression, itching and all other systems negative PMH:  Past Medical History:  Diagnosis Date  . Stroke [facility])     Social History:  Social History   Social History  . Marital status: Married    Spouse name: N/A  . Number of children: N/A  . Years of education: N/A   Occupational History  . Not on file.   Social History Main Topics  . Smoking status: Former Smoker    Packs/day: 1.00    Types: Cigarettes    Quit date: 05/21/2016  . Smokeless tobacco: Never Used  . Alcohol use No  . Drug use: No  . Sexual activity: Not on file   Other Topics Concern  . Not on file   Social History Narrative  . No narrative on file    Medications:   Current Outpatient Prescriptions on File Prior to Visit  Medication Sig Dispense Refill  . aspirin 325 MG  tablet Take 1 tablet (325 mg total) by mouth daily with breakfast.    . atorvastatin (LIPITOR) 10 MG tablet TAKE 1 TABLET BY MOUTH ONCE DAILY AT 6 PM 30 tablet 0  . citalopram (CELEXA) 10 MG tablet Take 1 tablet (10 mg total) by mouth daily. 30 tablet 1  . clopidogrel (PLAVIX) 75 MG tablet TAKE 1 TABLET BY MOUTH ONCE DAILY WITH BREAKFAST 30 tablet 0  . pantoprazole (PROTONIX) 40 MG tablet TAKE 1 TABLET BY MOUTH ONCE DAILY 30 tablet 0  . ramipril (ALTACE) 2.5 MG capsule Take 1 capsule (2.5 mg total) by mouth daily. 30  capsule 1  . traZODone (DESYREL) 50 MG tablet TAKE 1 TABLET BY MOUTH AT BEDTIME AS NEEDED FOR SLEEP 30 tablet 0   No current facility-administered medications on file prior to visit.     Allergies:  No Known Allergies  Physical Exam General: well developed, well nourished 4 Caucasian male, seated, in no evident distress Head: head normocephalic and atraumatic.  Neck: supple with no carotid or supraclavicular bruits Cardiovascular: regular rate and rhythm, no murmurs Musculoskeletal: no deformity Skin:  no rash/petichiae Vascular:  Normal pulses all extremities Vitals:   07/30/16 1321  BP: (!) 142/86  Pulse: (!) 52   Neurologic Exam Mental Status: Awake and fully alert. Oriented to place and time. Recent and remote memory intact. Attention span, concentration and fund of knowledge appropriate. Mood and affect appropriate.  Cranial Nerves: Fundoscopic exam reveals sharp disc margins. Pupils equal, briskly reactive to light. Extraocular movements full without nystagmus. Visual fields full to confrontation. Hearing intact. Facial sensation intact. Face, tongue, palate moves normally and symmetrically.  Motor: Normal bulk and tone. Normal strength in all tested extremity muscles.Mild weakness left grip and intrinsic hand muscles. Orbits right over left upper extremity. Fine finger movements are diminished on the left. Sensory.: intact to touch ,pinprick .position and vibratory sensation Except for slight diminished sensation in the left hand and left leg..  Coordination: Rapid alternating movements normal in all extremities. Finger-to-nose and heel-to-shin performed accurately bilaterally. Gait and Station: Arises from chair without difficulty. Stance is normal. Gait demonstrates normal stride length and balance . Able to heel, toe and tandem walk without difficulty.  Reflexes: 1+ and symmetric. Toes downgoing.   NIHSS  1 Modified Rankin  2   ASSESSMENT: 52 year Caucasian male  with right MCA branch infarct secondary to right internal carotid artery occlusion treated with IV TPA and mechanical thrombectomy with right internal carotid artery angioplasty and stent placement. Multiple vascular risk factors of hypertension, hyperlipidemia, smoking, obesity. Patient is doing extremely well with significant functional improvement    PLAN: I had a long d/w patient and his wife about his recent stroke, risk for recurrent stroke/TIAs, personally independently reviewed imaging studies and stroke evaluation results and answered questions.Continue aspirin 325 mg daily and Plavix 75 mg daily for 2 more weeks and then discontinue aspirin and stay on Plavix alone for secondary stroke prevention and maintain strict control of hypertension with blood pressure goal below 130/90, diabetes with hemoglobin A1c goal below 6.5% and lipids with LDL cholesterol goal below 70 mg/dL. I also advised the patient to eat a healthy diet with plenty of whole grains, cereals, fruits and vegetables, exercise regularly and maintain ideal body weight .patient may resume prior physical activities but I advised him to increase them gradually as tolerated. Followup in the future with me in 6 months or call earlier if necessary.Greater than 50% of time during this 25  minute visit was spent on counseling,explanation of diagnosis, planning of further management, discussion with patient and family and coordination of care Antony Contras, MD  [facility] [address] [place], [place] [address]  Phone [phone] Fax [phone] Note: This document was prepared with digital dictation and possible smart phrase technology. Any transcriptional errors that result from this process are unintentional

## 2016-08-01 NOTE — Therapy (Signed)
[facility] [address] [place], [place], [address] Phone: [phone]   Fax:  [phone]  Physical Therapy Evaluation  Patient Details  Name: Johnathan [name] MRN: [id number] Date of Birth: 1951/04/14 Referring Provider: Dr. Alysia Penna  Encounter Date: 07/31/2016      PT End of Session - 08/01/16 1534    Visit Number 1   Authorization Type Medicare   Authorization Time Period 07-31-16 eval only   PT Start Time 1315   PT Stop Time 1400   PT Time Calculation (min) 45 min      Past Medical History:  Diagnosis Date  . Stroke [facility])     Past Surgical History:  Procedure Laterality Date  . knee arthoscopy    . LEG SURGERY    . RADIOLOGY WITH ANESTHESIA N/A 05/12/2016   Procedure: RADIOLOGY WITH ANESTHESIA;  Surgeon: Luanne Bras, MD;  Location: [place];  Service: Radiology;  Laterality: N/A;    There were no vitals filed for this visit.       Subjective Assessment - 07/31/16 1326    Subjective Pt states he is mostly worried about his L hand "I can't tell how much pressure I have on it"; pt reports decreased sensation in L hand:   Patient Stated Goals Main goal is to improve L hand function - feels like balance and walking are doing rather good            [facility] PT Assessment - 08/01/16 0001      Assessment   Medical Diagnosis R CVA   Referring Provider Dr. Alysia Penna   Onset Date/Surgical Date 05/12/16   Prior Therapy pt was on inpatient rehab 6-14- 30 -17 and then received home health PT and OT     Precautions   Precautions Other (comment)  decr sensation LUE, no power tools, no driving   Precaution Comments pt reports that MD said that he can slowly begin using riding lawnmower     Balance Screen   Has the patient fallen in the past 6 months No   Has the patient had a decrease in activity level because of a fear of falling?  No   Is the patient reluctant to leave their home because  of a fear of falling?  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 LE's     Transfers   Transfers Sit to Stand   Sit to Stand 7: Independent     Ambulation/Gait   Ambulation/Gait Yes   Ambulation/Gait Assistance 7: Independent   Ambulation Distance (Feet) 200 Feet   Assistive device None   Gait Pattern Within Functional Limits   Gait velocity 8.34 secs = 3.93 ft/sec   Stairs Yes   Stairs Assistance 7: Independent   Stair Management Technique No rails;Alternating pattern   Number of Stairs 4   Height of Stairs 6  Standardized Balance Assessment   Standardized Balance Assessment Timed Up and Go Test     Berg Balance Test   Sit to Stand Able to stand without using hands and stabilize independently   Standing Unsupported Able to stand safely 2 minutes   Sitting with Back Unsupported but Feet Supported on Floor or Stool Able to sit safely and securely 2 minutes   Stand to Sit Sits safely with minimal use of hands   Transfers Able to transfer safely, minor use of hands   Standing Unsupported with Eyes Closed Able to stand 10 seconds safely   Standing Ubsupported with Feet Together Able to place feet together independently and stand 1 minute safely   From Standing, Reach Forward with Outstretched Arm Can reach confidently >25 cm (10")   From Standing Position, Pick up Object from Floor Able to pick up shoe safely and easily   From Standing Position, Turn to Look Behind Over each Shoulder Looks behind from both sides  and weight shifts well   Turn 360 Degrees Able to turn 360 degrees safely in 4 seconds or less   Standing Unsupported, Alternately Place Feet on Step/Stool Able to stand independently and safely and complete 8 steps in 20 seconds   Standing Unsupported, One Foot in Front Able to place foot tandem independently and hold 30 seconds   Standing on One Leg Able to lift leg independently and hold > 10 seconds   Total Score 56     Timed Up and Go Test   Normal TUG (seconds) 8.91     LLE strength is WNL's                      PT Education - 08/01/16 1540    Education provided Yes   Education Details eval findings with pt instructed in coordination ex. (use of Solo cups for targets) to improve LLE coordination   Person(s) Educated Patient   Methods Explanation;Demonstration   Comprehension Verbalized understanding             PT Long Term Goals - 08/01/16 1541      PT LONG TERM GOAL #1   Title N/A as initial eval only - No PT warranted at this time               Plan - 08/01/16 1535    Clinical Impression Statement Pt is a 65 year old gentleman s/p R CVA on 05-12-16; pt has very minimal residual strength deficits in his LLE with c/o L hand and grip dysfunction;  some mild ataxia noted in LLE but is not impacting pt's function. Balance and strength of LLE are WNL's and no significant gait deviations at this time which are interfering with pt's function.  Skilled PT is not warranted - pt wishes to receive OT only to address LUE deficits.   PT Frequency 1x / week   PT Duration --  eval only for PT   PT Treatment/Interventions Patient/family education   PT Next Visit Plan N/A eval only   PT Home Exercise Plan instructed pt in coordination ex for LLE and SLS/tandem stance   Recommended Other Services pt to receive OT services   Consulted and Agree with Plan of Care Patient      Patient will benefit from skilled therapeutic intervention in order to improve the  following deficits and impairments:     Visit Diagnosis: Hemiplegia and hemiparesis following cerebral infarction affecting left non-dominant side ([place]) - Plan: PT  plan of care cert/re-cert  Other abnormalities of gait and mobility - Plan: PT plan of care cert/re-cert      G-Codes - [id number]    Functional Assessment Tool Used TUG score 8.91 secs ; gait velocity 3.93 ft/sec    Functional Limitation Mobility: Walking and moving around   Mobility: Walking and Moving Around Current Status [phone]) At least 1 percent but less than 20 percent impaired, limited or restricted   Mobility: Walking and Moving Around Goal Status [phone]) At least 1 percent but less than 20 percent impaired, limited or restricted   Mobility: Walking and Moving Around Discharge Status [phone]) At least 1 percent but less than 20 percent impaired, limited or restricted       Problem List Patient Active Problem List   Diagnosis Date Noted  . Hemiparesis as late effect of cerebrovascular accident (CVA) ([place]) 06/11/2016  . Left-sided weakness 05/16/2016  . Right Carotid artery occlusion with infarction ([place]) 05/16/2016  . Essential hypertension 05/16/2016  . Hyperlipidemia LDL goal <70 05/16/2016  . Cigarette smoker 05/16/2016  . Overweight (BMI 25.0-29.9) 05/16/2016  . 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 Acute respiratory failure ([place]) 05/16/2016  . 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 Gait disturbance, post-stroke   . Hypokalemia   . Hypophosphatemia   . Cerebrovascular accident (CVA) due to thrombosis of right carotid artery [facility]) s/p IV tPA, R ICA stent placement, thrombectomy     Breleigh Carpino, [place], PT 08/01/2016, 3:45 PM  [facility] [address] [place], [place], [address] Phone: [phone]   Fax:  [phone]  Name: Johnathan [name] MRN: [id number] Date of Birth:  1951-05-24

## 2016-08-13 ENCOUNTER — Encounter: Payer: 59 | Attending: Physical Medicine & Rehabilitation

## 2016-08-13 ENCOUNTER — Encounter: Payer: Self-pay | Admitting: Physical Medicine & Rehabilitation

## 2016-08-13 ENCOUNTER — Ambulatory Visit (HOSPITAL_BASED_OUTPATIENT_CLINIC_OR_DEPARTMENT_OTHER): Payer: 59 | Admitting: Physical Medicine & Rehabilitation

## 2016-08-13 VITALS — BP 152/95 | HR 64 | Resp 14

## 2016-08-13 DIAGNOSIS — Z7982 Long term (current) use of aspirin: Secondary | ICD-10-CM | POA: Diagnosis not present

## 2016-08-13 DIAGNOSIS — I69354 Hemiplegia and hemiparesis following cerebral infarction affecting left non-dominant side: Secondary | ICD-10-CM | POA: Insufficient documentation

## 2016-08-13 DIAGNOSIS — I639 Cerebral infarction, unspecified: Secondary | ICD-10-CM

## 2016-08-13 DIAGNOSIS — Z5189 Encounter for other specified aftercare: Secondary | ICD-10-CM | POA: Insufficient documentation

## 2016-08-13 DIAGNOSIS — I63031 Cerebral infarction due to thrombosis of right carotid artery: Secondary | ICD-10-CM | POA: Insufficient documentation

## 2016-08-13 DIAGNOSIS — I69359 Hemiplegia and hemiparesis following cerebral infarction affecting unspecified side: Secondary | ICD-10-CM

## 2016-08-13 DIAGNOSIS — Z95828 Presence of other vascular implants and grafts: Secondary | ICD-10-CM | POA: Diagnosis not present

## 2016-08-13 NOTE — Patient Instructions (Addendum)
Cont Outpt OT  May try using weed Wacker, or leaf blower

## 2016-08-13 NOTE — Progress Notes (Signed)
Subjective:    Patient ID: Johnathan [name], male    DOB: 06-Oct-1951, 65 y.o.   MRN: [id number]  HPI   Pain Inventory Average Pain 0 Pain Right Now 0 My pain is no pain  In the last 24 hours, has pain interfered with the following? General activity 0 Relation with others 0 Enjoyment of life 0 What TIME of day is your pain at its worst? no pain Sleep (in general) Fair  Pain is worse with: no pain Pain improves with: no pain Relief from Meds: no pain  Mobility walk without assistance how many minutes can you walk? 20 ability to climb steps?  yes do you drive?  no  Function retired  Neuro/Psych numbness tingling loss of taste or smell  Prior Studies Any changes since last visit?  no  Physicians involved in your care Any changes since last visit?  no   History reviewed. No pertinent family history. Social History   Social History  . Marital status: Married    Spouse name: N/A  . Number of children: N/A  . Years of education: N/A   Social History Main Topics  . Smoking status: Former Smoker    Packs/day: 1.00    Types: Cigarettes    Quit date: 05/21/2016  . Smokeless tobacco: Never Used  . Alcohol use No  . Drug use: No  . Sexual activity: Not Asked   Other Topics Concern  . None   Social History Narrative  . None   Past Surgical History:  Procedure Laterality Date  . knee arthoscopy    . LEG SURGERY    . RADIOLOGY WITH ANESTHESIA N/A 05/12/2016   Procedure: RADIOLOGY WITH ANESTHESIA;  Surgeon: Luanne Bras, MD;  Location: [place];  Service: Radiology;  Laterality: N/A;   Past Medical History:  Diagnosis Date  . Stroke ([place])    BP (!) 152/95 (BP Location: Left Arm, Patient Position: Sitting, Cuff Size: Large)   Pulse 64   Resp 14   SpO2 98%   Opioid Risk Score:   Fall Risk Score:  `1  Depression screen PHQ 2/9  Depression screen PHQ 2/9 06/11/2016  Decreased Interest 3  Down, Depressed, Hopeless 2  PHQ - 2 Score 5  Altered  sleeping 0  Tired, decreased energy 3  Change in appetite 0  Feeling bad or failure about yourself  2  Trouble concentrating 0  Moving slowly or fidgety/restless 0  Suicidal thoughts 0  PHQ-9 Score 10    Review of Systems  Constitutional: Positive for chills.  HENT: Negative.   Eyes: Negative.   Respiratory: Negative.   Cardiovascular: Negative.   Endocrine: Negative.   Genitourinary: Negative.   Allergic/Immunologic: Negative.   Neurological: Positive for numbness.       Tingling  Hematological: Bruises/bleeds easily.  All other systems reviewed and are negative.      Objective:   Physical Exam        Assessment & Plan:     Subjective:    Patient ID: Johnathan [name], male    DOB: 1951-05-26, 65 y.o.   MRN: [id number]  HPI PCP: Dr. Antony Contras.  No new hospitalizations. Not driving He is retired Starting to use Engineer, building services, oked by Neuro per pt  Pain Inventory Average Pain 2 Pain Right Now 0 My pain is tingling and aching  In the last 24 hours, has pain interfered with the following? General activity 2 Relation with others 0 Enjoyment of life 0 What TIME  of day is your pain at its worst? evening Sleep (in general) Good  Pain is worse with: sitting Pain improves with: rest Relief from Meds: n/a  Mobility walk without assistance how many minutes can you walk? 20-30 ability to climb steps?  yes do you drive?  no  Function retired I need assistance with the following:  dressing and household duties  Neuro/Psych tingling depression anxiety  Prior Studies Any changes since last visit?  no  Physicians involved in your care Any changes since last visit?  yes Primary care Marijo File   History reviewed. No pertinent family history. Social History   Social History  . Marital status: Married    Spouse name: N/A  . Number of children: N/A  . Years of education: N/A   Social History Main Topics  . Smoking status: Former Smoker     Packs/day: 1.00    Types: Cigarettes    Quit date: 05/21/2016  . Smokeless tobacco: Never Used  . Alcohol use No  . Drug use: No  . Sexual activity: Not Asked   Other Topics Concern  . None   Social History Narrative  . None   Past Surgical History:  Procedure Laterality Date  . knee arthoscopy    . LEG SURGERY    . RADIOLOGY WITH ANESTHESIA N/A 05/12/2016   Procedure: RADIOLOGY WITH ANESTHESIA;  Surgeon: Luanne Bras, MD;  Location: [place];  Service: Radiology;  Laterality: N/A;   Past Medical History:  Diagnosis Date  . Stroke ([place])    BP (!) 152/95 (BP Location: Left Arm, Patient Position: Sitting, Cuff Size: Large)   Pulse 64   Resp 14   SpO2 98%   Opioid Risk Score:   Fall Risk Score:  `1  Depression screen PHQ 2/9  Depression screen PHQ 2/9 06/11/2016  Decreased Interest 3  Down, Depressed, Hopeless 2  PHQ - 2 Score 5  Altered sleeping 0  Tired, decreased energy 3  Change in appetite 0  Feeling bad or failure about yourself  2  Trouble concentrating 0  Moving slowly or fidgety/restless 0  Suicidal thoughts 0  PHQ-9 Score 10    Review of Systems  Constitutional: Negative.   HENT: Negative.   Respiratory: negative  Endocrine: Negative.   Genitourinary: Negative.   Allergic/Immunologic: Negative.   Neurological: Negative.        Tingling  Hematological: Bruises/bleeds easily.  Psychiatric/Behavioral: Positive for dysphoric mood.  All other systems reviewed and are negative.      Objective:   Physical Exam  Constitutional: He is oriented to person, place, and time. He appears well-developed and well-nourished.  HENT:  Head: Normocephalic and atraumatic.  Eyes: Conjunctivae and EOM are normal. Pupils are equal, round, and reactive to light.  Visual fields are intact confrontation testing  Neck: Normal range of motion.  Neurological: He is alert and oriented to person, place, and time. Coordination and gait abnormal.  Reflex Scores:         Psychiatric: He has a normal mood and affect.  Nursing note and vitals reviewed.  Motor strength is 4+/5 in the left deltoid, biceps, triceps, grip 4+ at the left hip flexor, knee extensor 4 minus at the left ankle dorsiflexor, plantar flexor. Sensation is reduced to light touch as well as pinprick in the left upper and left lower limb. His coordination is decreased with fine motor finger-to thumb on the left side.  Visual fields are intact confrontation testing. No evidence of neglect. At the  current time        Assessment & Plan:  1. 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 His neglect has improved. His left hemiparesis is still affecting him functionally for higher level balance as well as coordination  Continue outpatient rehabilitation with OT. Does not need PT. Okay for riding [place] for [employer] for [employer]  No driving yet  Physical medicine rehabilitation follow-up 1 month

## 2016-08-20 ENCOUNTER — Encounter: Payer: Self-pay | Admitting: *Deleted

## 2016-08-20 ENCOUNTER — Ambulatory Visit: Payer: 59 | Attending: Physical Medicine & Rehabilitation | Admitting: *Deleted

## 2016-08-20 DIAGNOSIS — I69354 Hemiplegia and hemiparesis following cerebral infarction affecting left non-dominant side: Secondary | ICD-10-CM | POA: Diagnosis not present

## 2016-08-20 DIAGNOSIS — R278 Other lack of coordination: Secondary | ICD-10-CM

## 2016-08-20 DIAGNOSIS — R208 Other disturbances of skin sensation: Secondary | ICD-10-CM | POA: Diagnosis not present

## 2016-08-20 NOTE — Patient Instructions (Addendum)
Grip Strengthening (Resistive Putty)    Squeeze putty using thumb and all fingers. Repeat  10-15__ times. Do _1-2_ sessions per day.   Lateral Pinch Strengthening (Resistive Putty)    Squeeze between thumb and side of each finger in turn. Repeat _10_ times. Do _1-2__ sessions per day.  Three Jaw Chuck Pinch Strengthening (Resistive Putty)    Roll putty, using thumb, index and middle fingers, pinch. Repeat _10___ times. Do __1-2__ sessions per day.   Do coordination exercises as issued & reviewed today in clinic (see handout).

## 2016-08-20 NOTE — Therapy (Signed)
[facility] [address] [place], [place], [address] Phone: [phone]   Fax:  [phone]  Occupational Therapy Treatment  Patient Details  Name: Johnathan [name] MRN: [id number] Date of Birth: [birth year] Referring Provider: Dr. Alysia Penna  Encounter Date: 08/20/2016      OT End of Session - 08/20/16 1111    Visit Number 2   Number of Visits 9   Date for OT Re-Evaluation 09/17/16   Authorization Type [facility] / Medicare (g-code needed)   Authorization Time Period cert. 07/31/16-09/29/16   Authorization - Visit Number 2   Authorization - Number of Visits 10   OT Start Time [id number]   OT Stop Time 1102   OT Time Calculation (min) 48 min   Activity Tolerance Patient tolerated treatment well   Behavior During Therapy WFL for tasks assessed/performed      Past Medical History:  Diagnosis Date  . Stroke [facility])     Past Surgical History:  Procedure Laterality Date  . knee arthoscopy    . LEG SURGERY    . RADIOLOGY WITH ANESTHESIA N/A 05/12/2016   Procedure: RADIOLOGY WITH ANESTHESIA;  Surgeon: Luanne Bras, MD;  Location: [facility];  Service: Radiology;  Laterality: N/A;    There were no vitals filed for this visit.      Subjective Assessment - 08/20/16 1019    Subjective  Pt reports that he has noticed that coordination and fine motor control is difficult with left hand. "I sometimes think I've let go of things and I haven't"   Pertinent History CVA affecting L side   Limitations no driving, no use of power tools, decr sensation   Patient Stated Goals to be able to use hand more    Currently in Pain? No/denies   Pain Score 0-No pain                      OT Treatments/Exercises (OP) - 08/20/16 0001      Exercises   Exercises Hand     Hand Exercises   Theraputty - Grip Left hand green putty 15 reps, vc's required   Theraputty - Pinch 3 point and lateral pinch Left hand 10 reps each  vc's/tc's required     Fine Motor Coordination   Fine Motor Coordination In hand manipuation training;Tendon glides;Flipping cards;Dealing card with thumb;Picking up coins;Manipulating coins;Stacking coins;Tossing ball   Flipping cards Left hand, vc's for positioning of shoulder/UE secondary to compensation   Dealing card with thumb Left hand - vc's for positioning/avoiding compensatory patterns   Picking up coins Left hand, vc's for positioning, min difficulty   Manipulating coins Left , vc's for positioning   Stacking coins Left, VC's   Tossing ball Moderate difficulty; L hand (tossing and cathching in 1 hand and tossing between hands)   Other Fine Motor Exercises Nuts and bolts left hand for coordination                OT Education - 08/20/16 1110    Education provided Yes   Education Details Instructed Pt in HEP for coordinaiton; putty/green (grip and pinch) left UE   Person(s) Educated Patient   Methods Explanation;Demonstration;Tactile cues;Verbal cues;Handout   Comprehension Verbalized understanding             OT Long Term Goals - 07/31/16 1745      OT LONG TERM GOAL #1   Title Pt will be independent with HEP for coordination and L shoulder  strength.--check goals 09/29/16   Status New     OT LONG TERM GOAL #2   Title Pt will improve coordination for ADLs as shown by improving time on 9-hole peg test by at least 8sec with L hand.   Baseline 33.81sec   Status New     OT LONG TERM GOAL #3   Title Pt will report incr ease releasing objects, no instances of crushing objects, or closing his hand in door for at least 1 week.   Period Weeks   Status New     OT LONG TERM GOAL #4   Title Pt will be able to complete buttons mod I.   Period Weeks   Status New     OT LONG TERM GOAL #5   Title Pt will don shirt on first attempt at least 90% of the time.   Status New               Plan - 08/20/16 1111    Clinical Impression Statement Pt should benefit  from HEP for LUE w/ focus on coordination and strengthening with green putty. He reports improved overall use, with deficits mainly w/ coordination.   Rehab Potential Good   OT Frequency 2x / week   OT Duration --  8 Visits + Eval   OT Treatment/Interventions Self-care/ADL training;Parrafin;Therapeutic exercise;DME and/or AE instruction;Therapeutic activities;Patient/family education;Neuromuscular education;Therapeutic exercises;Passive range of motion;Moist Heat;Fluidtherapy;Manual Therapy;Splinting   Plan Review HEP for coordination and putty. Consider shoulder strengthening LUE   Consulted and Agree with Plan of Care Patient      Patient will benefit from skilled therapeutic intervention in order to improve the following deficits and impairments:  Decreased coordination, Decreased strength, Impaired UE functional use, Impaired sensation  Visit Diagnosis: Hemiplegia 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 Hemiparesis as late effect of cerebrovascular accident (CVA) ([place]) 06/11/2016  . Left-sided weakness 05/16/2016  . Right Carotid artery occlusion with infarction ([place]) 05/16/2016  . Essential hypertension 05/16/2016  . Hyperlipidemia LDL goal <70 05/16/2016  . Cigarette smoker 05/16/2016  . Overweight (BMI 25.0-29.9) 05/16/2016  . 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 Acute respiratory failure ([place]) 05/16/2016  . Cerebral infarction involving right middle cerebral artery ([place]) 05/16/2016  . Gait disturbance, post-stroke   . Hypokalemia   . Hypophosphatemia   . Cerebrovascular accident (CVA) due to thrombosis of right carotid artery [facility]) s/p IV tPA, R ICA stent placement, thrombectomy     Barnhill, Amy Ardath Sax, OTR/L 08/20/2016, 11:16 AM  [facility] [address] [place], [place], [address] Phone: [phone]   Fax:  [phone]  Name: ADARIUS ORDERS MRN: [id number] Date of Birth: [birth year]

## 2016-08-27 ENCOUNTER — Ambulatory Visit: Payer: 59 | Admitting: Occupational Therapy

## 2016-08-27 DIAGNOSIS — I69354 Hemiplegia and hemiparesis following cerebral infarction affecting left non-dominant side: Secondary | ICD-10-CM | POA: Diagnosis not present

## 2016-08-27 DIAGNOSIS — R208 Other disturbances of skin sensation: Secondary | ICD-10-CM | POA: Diagnosis not present

## 2016-08-27 DIAGNOSIS — R278 Other lack of coordination: Secondary | ICD-10-CM

## 2016-08-27 NOTE — Therapy (Signed)
[facility] [address] [place], [place], [address] Phone: [phone]   Fax:  [phone]  Occupational Therapy Treatment  Patient Details  Name: Johnathan [name] MRN: [id number] Date of Birth: 01-31-1951 Referring Provider: Dr. Alysia Penna  Encounter Date: 08/27/2016      OT End of Session - 08/27/16 1413    Visit Number 3   Number of Visits 9   Date for OT Re-Evaluation 09/17/16   Authorization Type [facility] / Medicare (g-code needed)   Authorization Time Period cert. 07/31/16-09/29/16   Authorization - Visit Number 3   Authorization - Number of Visits 10   OT Start Time 1406   OT Stop Time 1445   OT Time Calculation (min) 39 min   Activity Tolerance Patient tolerated treatment well   Behavior During Therapy WFL for tasks assessed/performed      Past Medical History:  Diagnosis Date  . Stroke [facility])     Past Surgical History:  Procedure Laterality Date  . knee arthoscopy    . LEG SURGERY    . RADIOLOGY WITH ANESTHESIA N/A 05/12/2016   Procedure: RADIOLOGY WITH ANESTHESIA;  Surgeon: Luanne Bras, MD;  Location: [facility];  Service: Radiology;  Laterality: N/A;    There were no vitals filed for this visit.      Subjective Assessment - 08/27/16 1410    Subjective  Pt reports tingling at times.   Pertinent History CVA affecting L side   Limitations no driving, no use of power tools, decr sensation   Patient Stated Goals to be able to use hand more    Currently in Pain? No/denies                      OT Treatments/Exercises (OP) - 08/27/16 0001      Fine Motor Coordination   Fine Motor Coordination In hand manipuation training;Grooved pegs   In Hand Manipulation Training rotating 2 small balls in hand each direction with min difficulty 1 way and mod-max initially the opposite way, but improved with repetition  min cueing   Flipping cards with min diffiuclty   Dealing card with  thumb min difficulty   Manipulating coins with min difficulty wnad incr time   Stacking coins min difficulty   Tossing ball min difficulty; L hand (tossing and cathching in 1 hand and tossing between hands)   Grooved pegs with L hand with min-mod difficulty and min cues for shoulder hike     Functional Reaching Activities   High Level Placing/removing clothespins with1-8lb resistance on vertical pole for incr coordination, graded movement, and pinch strength with min difficulty/cues for postural/proximal compensation.                     OT Long Term Goals - 07/31/16 1745      OT LONG TERM GOAL #1   Title Pt will be independent with HEP for coordination and L shoulder strength.--check goals 09/29/16   Status New     OT LONG TERM GOAL #2   Title Pt will improve coordination for ADLs as shown by improving time on 9-hole peg test by at least 8sec with L hand.   Baseline 33.81sec   Status New     OT LONG TERM GOAL #3   Title Pt will report incr ease releasing objects, no instances of crushing objects, or closing his hand in door for at least 1 week.   Period Weeks  Status New     OT LONG TERM GOAL #4   Title Pt will be able to complete buttons mod I.   Period Weeks   Status New     OT LONG TERM GOAL #5   Title Pt will don shirt on first attempt at least 90% of the time.   Status New               Plan - 08/27/16 1416    Clinical Impression Statement Pt reports improved ability to don shirt.  Pt progressing with LUE coordination.   Rehab Potential Good   OT Frequency 2x / week   OT Duration --  8 Visits + Eval   OT Treatment/Interventions Self-care/ADL training;Parrafin;Therapeutic exercise;DME and/or AE instruction;Therapeutic activities;Patient/family education;Neuromuscular education;Therapeutic exercises;Passive range of motion;Moist Heat;Fluidtherapy;Manual Therapy;Splinting   Plan continue with coordination, LUE strengthening    Consulted and Agree  with Plan of Care Patient      Patient will benefit from skilled therapeutic intervention in order to improve the following deficits and impairments:  Decreased coordination, Decreased strength, Impaired UE functional use, Impaired sensation  Visit Diagnosis: Hemiplegia 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 Hemiparesis as late effect of cerebrovascular accident (CVA) ([place]) 06/11/2016  . Left-sided weakness 05/16/2016  . Right Carotid artery occlusion with infarction ([place]) 05/16/2016  . Essential hypertension 05/16/2016  . Hyperlipidemia LDL goal <70 05/16/2016  . Cigarette smoker 05/16/2016  . Overweight (BMI 25.0-29.9) 05/16/2016  . 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 Acute respiratory failure ([place]) 05/16/2016  . Cerebral infarction involving right middle cerebral artery ([place]) 05/16/2016  . Gait disturbance, post-stroke   . Hypokalemia   . Hypophosphatemia   . Cerebrovascular accident (CVA) due to thrombosis of right carotid artery [facility]) s/p IV tPA, R ICA stent placement, thrombectomy     [facility] 08/27/2016, 2:42 PM  [facility] [address] [place], [place], [address] Phone: [phone]   Fax:  [phone]  Name: Johnathan [name] MRN: [id number] Date of Birth: 1951-08-09   Vianne Bulls, OTR/L [facility] [address] [place], [place]  [address] [phone] phone [phone] 08/27/16 2:42 PM

## 2016-08-30 ENCOUNTER — Ambulatory Visit: Payer: 59 | Admitting: Occupational Therapy

## 2016-08-30 DIAGNOSIS — I69354 Hemiplegia and hemiparesis following cerebral infarction affecting left non-dominant side: Secondary | ICD-10-CM

## 2016-08-30 DIAGNOSIS — R278 Other lack of coordination: Secondary | ICD-10-CM | POA: Diagnosis not present

## 2016-08-30 DIAGNOSIS — R208 Other disturbances of skin sensation: Secondary | ICD-10-CM | POA: Diagnosis not present

## 2016-08-30 NOTE — Therapy (Signed)
[facility] [address] [place], [place], [address] Phone: [phone]   Fax:  [phone]  Occupational Therapy Treatment  Patient Details  Name: Johnathan [name] MRN: [id number] Date of Birth: [birth year] Referring Provider: Dr. Alysia Penna  Encounter Date: 08/30/2016      OT End of Session - 08/30/16 1538    Visit Number 4   Number of Visits 9   Date for OT Re-Evaluation 09/17/16   Authorization Type [facility] / Medicare (g-code needed)   Authorization Time Period cert. 07/31/16-09/29/16   Authorization - Visit Number 4   Authorization - Number of Visits 10   OT Start Time [id number]   OT Stop Time [id number]   OT Time Calculation (min) 40 min   Activity Tolerance Patient tolerated treatment well   Behavior During Therapy WFL for tasks assessed/performed      Past Medical History:  Diagnosis Date  . Stroke [facility])     Past Surgical History:  Procedure Laterality Date  . knee arthoscopy    . LEG SURGERY    . RADIOLOGY WITH ANESTHESIA N/A 05/12/2016   Procedure: RADIOLOGY WITH ANESTHESIA;  Surgeon: Luanne Bras, MD;  Location: [facility];  Service: Radiology;  Laterality: N/A;    There were no vitals filed for this visit.      Subjective Assessment - 08/30/16 1539    Subjective  Pt reports doing some yard work today   Pertinent History CVA affecting L side   Limitations no driving, no use of power tools, decr sensation   Patient Stated Goals to be able to use hand more    Currently in Pain? No/denies                      OT Treatments/Exercises (OP) - 08/30/16 0001      ADLs   UB Dressing Practiced buttoning/unbuttoning shirt on table with min difficulty and incr time x2     Fine Motor Coordination   Fine Motor Coordination Purdue Pegboard   Purdue Pegboard with min difficulty/incr time for coordination, removing with in-hand manipulation     Sensation Exercises   Stereognosis Pulling  various small items from rice bath with mod-max difficulty, pt only able to find larger objects and had a block in palm once but unable to tell.     Sensory Retraining Ambulating while carrying cup of water 3/4 full and plate with marble with no spills (switched hands) and then with category generation for divided attention with min difficulty.    Picking up blocks using gripper on 1-3 resistance (black spring), changing to retrain grading pressure on objects.                       OT Long Term Goals - 07/31/16 1745      OT LONG TERM GOAL #1   Title Pt will be independent with HEP for coordination and L shoulder strength.--check goals 09/29/16   Status New     OT LONG TERM GOAL #2   Title Pt will improve coordination for ADLs as shown by improving time on 9-hole peg test by at least 8sec with L hand.   Baseline 33.81sec   Status New     OT LONG TERM GOAL #3   Title Pt will report incr ease releasing objects, no instances of crushing objects, or closing his hand in door for at least 1 week.   Period Weeks   Status New  OT LONG TERM GOAL #4   Title Pt will be able to complete buttons mod I.   Period Weeks   Status New     OT LONG TERM GOAL #5   Title Pt will don shirt on first attempt at least 90% of the time.   Status New               Plan - 08/30/16 1537    Clinical Impression Statement Pt continues to progess with LUE coordination.   Rehab Potential Good   OT Frequency 2x / week   OT Duration --  8 Visits + Eval   OT Treatment/Interventions Self-care/ADL training;Parrafin;Therapeutic exercise;DME and/or AE instruction;Therapeutic activities;Patient/family education;Neuromuscular education;Therapeutic exercises;Passive range of motion;Moist Heat;Fluidtherapy;Manual Therapy;Splinting   Plan continue with coordination, sensory re-training   Consulted and Agree with Plan of Care Patient      Patient will benefit from skilled therapeutic intervention in  order to improve the following deficits and impairments:  Decreased coordination, Decreased strength, Impaired UE functional use, Impaired sensation  Visit Diagnosis: Hemiplegia 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 Hemiparesis as late effect of cerebrovascular accident (CVA) ([place]) 06/11/2016  . Left-sided weakness 05/16/2016  . Right Carotid artery occlusion with infarction ([place]) 05/16/2016  . Essential hypertension 05/16/2016  . Hyperlipidemia LDL goal <70 05/16/2016  . Cigarette smoker 05/16/2016  . Overweight (BMI 25.0-29.9) 05/16/2016  . 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 Acute respiratory failure ([place]) 05/16/2016  . Cerebral infarction involving right middle cerebral artery ([place]) 05/16/2016  . Gait disturbance, post-stroke   . Hypokalemia   . Hypophosphatemia   . Cerebrovascular accident (CVA) due to thrombosis of right carotid artery [facility]) s/p IV tPA, R ICA stent placement, thrombectomy     [facility] 08/30/2016, 4:26 PM  [facility] [address] [place], [place], [address] Phone: [phone]   Fax:  [phone]  Name: Johnathan [name] MRN: [id number] Date of Birth: 09-20-1951\  Vianne Bulls, OTR/L [facility] [address] [place], [place]  [address] [phone] phone [phone] 08/30/16 4:27 PM

## 2016-09-03 ENCOUNTER — Ambulatory Visit: Payer: 59 | Attending: Physical Medicine & Rehabilitation | Admitting: Occupational Therapy

## 2016-09-03 DIAGNOSIS — I69354 Hemiplegia and hemiparesis following cerebral infarction affecting left non-dominant side: Secondary | ICD-10-CM | POA: Diagnosis not present

## 2016-09-03 DIAGNOSIS — R208 Other disturbances of skin sensation: Secondary | ICD-10-CM | POA: Insufficient documentation

## 2016-09-03 DIAGNOSIS — R278 Other lack of coordination: Secondary | ICD-10-CM | POA: Insufficient documentation

## 2016-09-03 NOTE — Therapy (Signed)
[facility] [address] [place], [place], [address] Phone: [phone]   Fax:  [phone]  Occupational Therapy Treatment  Patient Details  Name: Johnathan [name] MRN: [id number] Date of Birth: 04-Oct-1951 Referring Provider: Dr. Alysia Penna  Encounter Date: 09/03/2016      OT End of Session - 09/03/16 1458    Visit Number 5   Number of Visits 9   Date for OT Re-Evaluation 09/17/16   Authorization Type [facility] / Medicare (g-code needed)   Authorization Time Period cert. 07/31/16-09/29/16   Authorization - Visit Number 5   Authorization - Number of Visits 10   OT Start Time [id number]   OT Stop Time [id number]   OT Time Calculation (min) 40 min   Activity Tolerance Patient tolerated treatment well   Behavior During Therapy WFL for tasks assessed/performed      Past Medical History:  Diagnosis Date  . Stroke [facility])     Past Surgical History:  Procedure Laterality Date  . knee arthoscopy    . LEG SURGERY    . RADIOLOGY WITH ANESTHESIA N/A 05/12/2016   Procedure: RADIOLOGY WITH ANESTHESIA;  Surgeon: Luanne Bras, MD;  Location: [place];  Service: Radiology;  Laterality: N/A;    There were no vitals filed for this visit.      Subjective Assessment - 09/03/16 1455    Subjective  Pt reports donning shirt easier and that he hasn't shut hand in door recently.   Pertinent History CVA affecting L side   Limitations no driving, no use of power tools, decr sensation   Patient Stated Goals to be able to use hand more    Currently in Pain? No/denies                      OT Treatments/Exercises (OP) - 09/03/16 0001      ADLs   UB Dressing Practiced buttoning/unbuttoning shirt on table top with min difficulty and incr time.  Unable to button sleeve button with L hand only.     Fine Motor Coordination   Fine Motor Coordination O'Connor pegs   O'Connor pegs attempted placing pegs in with tweezers with max  difficulty/unable due to vision (pt did not bring reading glasses, therefore, discontinued and requested pt bring reading glasses for future visits.   Purdue Pegboard with min difficulty/incr time for coordination, removing with in-hand manipulation   Other Fine Motor Exercises Nuts and bolts left hand for coordination with vision occluded to encourage sensory re-training.     Sensation Exercises   Sensory Retraining Removing small pegs from yellow putty to place in pegboard with min-mod difficulty and incr time.  Then removing with each finger/thumb for incr coordination.                       OT Long Term Goals - 09/03/16 1456      OT LONG TERM GOAL #1   Title Pt will be independent with HEP for coordination and L shoulder strength.--check goals 09/29/16   Status New     OT LONG TERM GOAL #2   Title Pt will improve coordination for ADLs as shown by improving time on 9-hole peg test by at least 8sec with L hand.   Baseline 33.81sec   Status New     OT LONG TERM GOAL #3   Title Pt will report incr ease releasing objects, no instances of crushing objects, or closing his hand in  door for at least 1 week.   Period Weeks   Status On-going     OT LONG TERM GOAL #4   Title Pt will be able to complete buttons mod I.   Period Weeks   Status New     OT LONG TERM GOAL #5   Title Pt will don shirt on first attempt at least 90% of the time.   Status Achieved  09/03/16 per pt report               Plan - 09/03/16 1457    Clinical Impression Statement Pt continues to progress with LUE functional use/coordination.  Pt reports improved ability to don t-shirt.   Rehab Potential Good   OT Frequency 2x / week   OT Duration --  8 Visits + Eval   OT Treatment/Interventions Self-care/ADL training;Parrafin;Therapeutic exercise;DME and/or AE instruction;Therapeutic activities;Patient/family education;Neuromuscular education;Therapeutic exercises;Passive range of motion;Moist  Heat;Fluidtherapy;Manual Therapy;Splinting   Plan continue with coordination, sensory re-training   Consulted and Agree with Plan of Care Patient      Patient will benefit from skilled therapeutic intervention in order to improve the following deficits and impairments:  Decreased coordination, Decreased strength, Impaired UE functional use, Impaired sensation  Visit Diagnosis: Hemiplegia 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 Hemiparesis as late effect of cerebrovascular accident (CVA) ([place]) 06/11/2016  . Left-sided weakness 05/16/2016  . Right Carotid artery occlusion with infarction ([place]) 05/16/2016  . Essential hypertension 05/16/2016  . Hyperlipidemia LDL goal <70 05/16/2016  . Cigarette smoker 05/16/2016  . Overweight (BMI 25.0-29.9) 05/16/2016  . 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 Acute respiratory failure ([place]) 05/16/2016  . 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 Gait disturbance, post-stroke   . Hypokalemia   . Hypophosphatemia   . Cerebrovascular accident (CVA) due to thrombosis of right carotid artery [facility]) s/p IV tPA, R ICA stent placement, thrombectomy     [facility] 09/03/2016, 3:49 PM  [facility] [address] [place], [place], [address] Phone: [phone]   Fax:  [phone]  Name: Johnathan [name] MRN: [id number] Date of Birth: September 30, 1951   Vianne Bulls, OTR/L [facility] [address] [place], [place]  [address] [phone] phone [phone] 09/03/16 3:50 PM

## 2016-09-05 ENCOUNTER — Ambulatory Visit: Payer: 59 | Admitting: Occupational Therapy

## 2016-09-05 DIAGNOSIS — R278 Other lack of coordination: Secondary | ICD-10-CM

## 2016-09-05 DIAGNOSIS — R208 Other disturbances of skin sensation: Secondary | ICD-10-CM

## 2016-09-05 DIAGNOSIS — I69354 Hemiplegia and hemiparesis following cerebral infarction affecting left non-dominant side: Secondary | ICD-10-CM

## 2016-09-05 NOTE — Therapy (Signed)
[facility] [address] [place], [place], [address] Phone: [phone]   Fax:  [phone]  Occupational Therapy Treatment  Patient Details  Name: Johnathan [name] MRN: [id number] Date of Birth: [birth year] Referring Provider: Dr. Alysia Penna  Encounter Date: 09/05/2016      OT End of Session - 09/05/16 1550    Visit Number 6   Number of Visits 9   Date for OT Re-Evaluation 09/17/16   Authorization Type [facility] / Medicare (g-code needed)   Authorization Time Period cert. 07/31/16-09/29/16   Authorization - Visit Number 6   Authorization - Number of Visits 10   OT Start Time [id number]   OT Stop Time [id number]   OT Time Calculation (min) 40 min   Activity Tolerance Patient tolerated treatment well   Behavior During Therapy WFL for tasks assessed/performed      Past Medical History:  Diagnosis Date  . Stroke [facility])     Past Surgical History:  Procedure Laterality Date  . knee arthoscopy    . LEG SURGERY    . RADIOLOGY WITH ANESTHESIA N/A 05/12/2016   Procedure: RADIOLOGY WITH ANESTHESIA;  Surgeon: Luanne Bras, MD;  Location: [place];  Service: Radiology;  Laterality: N/A;    There were no vitals filed for this visit.      Subjective Assessment - 09/05/16 1540    Subjective  Pt reports that he has not used weedeater or leaf blower yet.   Pertinent History CVA affecting L side   Limitations no driving, no use of power tools, decr sensation   Patient Stated Goals to be able to use hand more    Currently in Pain? No/denies                      OT Treatments/Exercises (OP) - 09/05/16 0001      Fine Motor Coordination   Fine Motor Coordination Small Pegboard   Small Pegboard Functional reaching to place small pegs in vertical pegboard to copy design with min difficulty/drops for coordination.   O'Connor pegs placing pegs in pegboard with fingers with min difficulty and then with tweezers with  mod difficulty and removed pegs with tweezers.     Sensation Exercises   Stereognosis Pulling various small items from rice bath with min-mod difficulty, pt able to find smaller objects today (small pegs, coins)                OT Education - 09/05/16 1544    Education Details Recommended that pt avoid using chainsaw for now due decr coordination/sensation   Person(s) Educated Patient   Methods Explanation   Comprehension Verbalized understanding             OT Long Term Goals - 09/03/16 1456      OT LONG TERM GOAL #1   Title Pt will be independent with HEP for coordination and L shoulder strength.--check goals 09/29/16   Status New     OT LONG TERM GOAL #2   Title Pt will improve coordination for ADLs as shown by improving time on 9-hole peg test by at least 8sec with L hand.   Baseline 33.81sec   Status New     OT LONG TERM GOAL #3   Title Pt will report incr ease releasing objects, no instances of crushing objects, or closing his hand in door for at least 1 week.   Period Weeks   Status On-going     OT LONG  TERM GOAL #4   Title Pt will be able to complete buttons mod I.   Period Weeks   Status New     OT LONG TERM GOAL #5   Title Pt will don shirt on first attempt at least 90% of the time.   Status Achieved  09/03/16 per pt report               Plan - 09/05/16 1551    Clinical Impression Statement Pt continues to progess with LUE functional use/coordination.  Sensation deficits remain a barrier, but pt able to distingush smaller objects from rice today with vision occluded so sensation appears to be improved some.   Rehab Potential Good   OT Frequency 2x / week   OT Duration --  8 Visits + Eval   OT Treatment/Interventions Self-care/ADL training;Parrafin;Therapeutic exercise;DME and/or AE instruction;Therapeutic activities;Patient/family education;Neuromuscular education;Therapeutic exercises;Passive range of motion;Moist Heat;Fluidtherapy;Manual  Therapy;Splinting   Plan theraband HEP for L shoulder, practice buttons, ?update coordination HEP prn   OT Home Exercise Plan Education provided:   Coordination HEP   Consulted and Agree with Plan of Care Patient      Patient will benefit from skilled therapeutic intervention in order to improve the following deficits and impairments:  Decreased coordination, Decreased strength, Impaired UE functional use, Impaired sensation  Visit Diagnosis: Hemiplegia 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 Hemiparesis as late effect of cerebrovascular accident (CVA) ([place]) 06/11/2016  . Left-sided weakness 05/16/2016  . Right Carotid artery occlusion with infarction ([place]) 05/16/2016  . Essential hypertension 05/16/2016  . Hyperlipidemia LDL goal <70 05/16/2016  . Cigarette smoker 05/16/2016  . Overweight (BMI 25.0-29.9) 05/16/2016  . 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 Acute respiratory failure ([place]) 05/16/2016  . 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 Gait disturbance, post-stroke   . Hypokalemia   . Hypophosphatemia   . Cerebrovascular accident (CVA) due to thrombosis of right carotid artery [facility]) s/p IV tPA, R ICA stent placement, thrombectomy     [facility] 09/05/2016, 4:45 PM  [facility] [address] [place], [place], [address] Phone: [phone]   Fax:  [phone]  Name: Johnathan [name] MRN: [id number] Date of Birth: Jan 04, 1951   Vianne Bulls, OTR/L [facility] [address] [place], [place]  [address] [phone] phone [phone] 09/05/16 4:45 PM

## 2016-09-06 ENCOUNTER — Telehealth (HOSPITAL_COMMUNITY): Payer: Self-pay

## 2016-09-06 NOTE — Telephone Encounter (Signed)
Called to schedule f/u, left message for pt to return call. AW 

## 2016-09-07 ENCOUNTER — Other Ambulatory Visit (HOSPITAL_COMMUNITY): Payer: Self-pay | Admitting: Interventional Radiology

## 2016-09-07 DIAGNOSIS — I639 Cerebral infarction, unspecified: Secondary | ICD-10-CM

## 2016-09-10 ENCOUNTER — Encounter: Payer: 59 | Attending: Physical Medicine & Rehabilitation

## 2016-09-10 ENCOUNTER — Encounter: Payer: Self-pay | Admitting: Physical Medicine & Rehabilitation

## 2016-09-10 ENCOUNTER — Ambulatory Visit (HOSPITAL_BASED_OUTPATIENT_CLINIC_OR_DEPARTMENT_OTHER): Payer: 59 | Admitting: Physical Medicine & Rehabilitation

## 2016-09-10 VITALS — BP 127/76 | HR 75 | Resp 14

## 2016-09-10 DIAGNOSIS — Z7982 Long term (current) use of aspirin: Secondary | ICD-10-CM | POA: Diagnosis not present

## 2016-09-10 DIAGNOSIS — R209 Unspecified disturbances of skin sensation: Secondary | ICD-10-CM

## 2016-09-10 DIAGNOSIS — I69398 Other sequelae of cerebral infarction: Secondary | ICD-10-CM

## 2016-09-10 DIAGNOSIS — Z95828 Presence of other vascular implants and grafts: Secondary | ICD-10-CM | POA: Insufficient documentation

## 2016-09-10 DIAGNOSIS — I63031 Cerebral infarction due to thrombosis of right carotid artery: Secondary | ICD-10-CM | POA: Insufficient documentation

## 2016-09-10 DIAGNOSIS — I69354 Hemiplegia and hemiparesis following cerebral infarction affecting left non-dominant side: Secondary | ICD-10-CM | POA: Insufficient documentation

## 2016-09-10 DIAGNOSIS — Z5189 Encounter for other specified aftercare: Secondary | ICD-10-CM | POA: Insufficient documentation

## 2016-09-10 DIAGNOSIS — I69359 Hemiplegia and hemiparesis following cerebral infarction affecting unspecified side: Secondary | ICD-10-CM

## 2016-09-10 DIAGNOSIS — I639 Cerebral infarction, unspecified: Secondary | ICD-10-CM

## 2016-09-10 NOTE — Patient Instructions (Addendum)
Graduated return to driving instructions were provided. It is recommended that the patient first drives with another licensed driver in an empty parking lot. If the patient does well with this, and they can drive on a quiet street with the licensed driver. If the patient does well with this they can drive on a busy street with a licensed driver. If the patient does well with this, the next time out they can go by himself.  I recommend no nighttime or Interstate driving.   May increase protonix to twice a day

## 2016-09-10 NOTE — Progress Notes (Signed)
Subjective:    Patient ID: Johnathan [name], male    DOB: 23-Mar-1951, 65 y.o.   MRN: [id number]  HPI   Has used push mower, no issues with that.  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 Occasionally His wife feels that overall he is thinking well. Sometimes the patient does hesitate.  Pain Inventory Average Pain 0 Pain Right Now 0 My pain is No pain  In the last 24 hours, has pain interfered with the following? General activity No pain Relation with others No pain Enjoyment of life No pain What TIME of day is your pain at its worst? No pain Sleep (in general) Fair  Pain is worse with: No pain Pain improves with: No pain Relief from Meds: No pain  Mobility walk without assistance how many minutes can you walk? 15 minutes ability to climb steps?  yes Do you have any goals in this area?  no  Function retired I need assistance with the following:  dressing, meal prep and household duties Do you have any goals in this area?  yes  Neuro/Psych numbness tingling loss of taste or smell  Prior Studies Any changes since last visit?  no  Physicians involved in your care Any changes since last visit?  no   No family history on file. Social History   Social History  . Marital status: Married    Spouse name: N/A  . Number of children: N/A  . Years of education: N/A   Social History Main Topics  . Smoking status: Former Smoker    Packs/day: 1.00    Types: Cigarettes    Quit date: 05/21/2016  . Smokeless tobacco: Never Used  . Alcohol use No  . Drug use: No  . Sexual activity: Not Asked   Other Topics Concern  . None   Social History Narrative  . None   Past Surgical History:  Procedure Laterality Date  . knee arthoscopy    . LEG SURGERY    . RADIOLOGY WITH ANESTHESIA N/A 05/12/2016   Procedure: RADIOLOGY WITH ANESTHESIA;  Surgeon: Luanne Bras, MD;  Location: [place];  Service: Radiology;  Laterality: N/A;   Past Medical History:  Diagnosis Date  . Stroke ([place])    BP 127/76   Pulse 75   Resp 14   SpO2 96%   Opioid Risk Score:   Fall Risk Score:  `1  Depression screen PHQ 2/9  Depression screen PHQ 2/9 06/11/2016  Decreased Interest 3  Down, Depressed, Hopeless 2  PHQ - 2 Score 5  Altered sleeping 0  Tired, decreased energy 3  Change in appetite 0  Feeling bad or failure about yourself  2  Trouble concentrating 0  Moving slowly or fidgety/restless 0  Suicidal thoughts 0  PHQ-9 Score 10    Review of Systems  All other systems reviewed and are negative.      Objective:   Physical Exam  Constitutional: He is oriented to person, place, and time. He appears well-developed and well-nourished.  HENT:  Head: Normocephalic and atraumatic.  Eyes: Conjunctivae are normal. Pupils are equal, round, and reactive to light.  Neck: Normal range of motion.  Neurological: He is alert and oriented to person, place, and time.  Psychiatric: He has a normal mood and affect.  Nursing note and vitals reviewed.   Visual fields intact to confrontation testing Patient able  to do tandem gait, as well as toe walk and heel walk.     Assessment & Plan:  1. History of right subcortical infarct with left hemiparesis and left hemisensory deficits. He still has fairly significant hemisensory deficits, but strength is improved. Balance is quite good  No significant cognitive dysfunction other than some post stroke mood disorder. May benefit from Celexa. However, he stopped taking this. This may be helpful for his altered taste sensation as well.  Left neglect has resolved  Feel patient can go back to driving on a limited basis as outlined below  Graduated return to driving instructions were provided. It is recommended that the patient first drives with another licensed driver in an empty parking lot. If the patient does well  with this, and they can drive on a quiet street with the licensed driver. If the patient does well with this they can drive on a busy street with a licensed driver. If the patient does well with this, the next time out they can go by himself. I recommend no nighttime or Interstate driving.   Return to clinic one month

## 2016-09-11 ENCOUNTER — Ambulatory Visit: Payer: 59 | Admitting: Occupational Therapy

## 2016-09-11 DIAGNOSIS — R208 Other disturbances of skin sensation: Secondary | ICD-10-CM | POA: Diagnosis not present

## 2016-09-11 DIAGNOSIS — R278 Other lack of coordination: Secondary | ICD-10-CM

## 2016-09-11 DIAGNOSIS — I69354 Hemiplegia and hemiparesis following cerebral infarction affecting left non-dominant side: Secondary | ICD-10-CM | POA: Diagnosis not present

## 2016-09-11 NOTE — Therapy (Signed)
[facility] [address] [place], [place], [address] Phone: [phone]   Fax:  [phone]  Occupational Therapy Treatment  Patient Details  Name: Johnathan [name] MRN: [id number] Date of Birth: 08/22/1951 Referring Provider: Dr. Alysia Penna  Encounter Date: 09/11/2016      OT End of Session - 09/11/16 1550    Visit Number 7   Number of Visits 9   Date for OT Re-Evaluation 09/17/16   Authorization Type [facility] / Medicare (g-code needed)   Authorization Time Period cert. 07/31/16-09/29/16   Authorization - Visit Number 7   Authorization - Number of Visits 10   OT Start Time 1536   OT Stop Time 1618   OT Time Calculation (min) 42 min   Activity Tolerance Patient tolerated treatment well   Behavior During Therapy WFL for tasks assessed/performed      Past Medical History:  Diagnosis Date  . Stroke [facility])     Past Surgical History:  Procedure Laterality Date  . knee arthoscopy    . LEG SURGERY    . RADIOLOGY WITH ANESTHESIA N/A 05/12/2016   Procedure: RADIOLOGY WITH ANESTHESIA;  Surgeon: Luanne Bras, MD;  Location: [place];  Service: Radiology;  Laterality: N/A;    There were no vitals filed for this visit.      Subjective Assessment - 09/11/16 1543    Subjective  Pt reports hx of multiple L shoulder dislocations.  Pt reports that MD cleared him to begin driving   Pertinent History CVA affecting L side with sensory deficits;  Pt reports hx of multiple L shoulder dislocations.   Limitations no driving, no use of power tools, decr sensation   Patient Stated Goals to be able to use hand more    Currently in Pain? No/denies            [facility] OT Assessment - 09/11/16 0001      Precautions   Precaution Comments pt reports that MD cleared him to begin driving                  OT Treatments/Exercises (OP) - 09/11/16 0001      ADLs   UB Dressing Practiced buttoning with min difficulty  (on tabletop)   Driving Discussed safety concerns to be aware of with decr L side sensation for driving and reviewed MD instructions for return to driving.  Pt verbalized understanding.     Fine Motor Coordination   Grooved pegs with L hand with min difficulty                OT Education - 09/11/16 1547    Education Details Yellow theraband HEP; Additional coordination HEP   Person(s) Educated Patient   Methods Explanation;Demonstration;Verbal cues;Handout   Comprehension Verbalized understanding;Returned demonstration;Verbal cues required  min cues             OT Long Term Goals - 09/03/16 1456      OT LONG TERM GOAL #1   Title Pt will be independent with HEP for coordination and L shoulder strength.--check goals 09/29/16   Status New     OT LONG TERM GOAL #2   Title Pt will improve coordination for ADLs as shown by improving time on 9-hole peg test by at least 8sec with L hand.   Baseline 33.81sec   Status New     OT LONG TERM GOAL #3   Title Pt will report incr ease releasing objects, no instances of crushing objects,  or closing his hand in door for at least 1 week.   Period Weeks   Status On-going     OT LONG TERM GOAL #4   Title Pt will be able to complete buttons mod I.   Period Weeks   Status New     OT LONG TERM GOAL #5   Title Pt will don shirt on first attempt at least 90% of the time.   Status Achieved  09/03/16 per pt report               Plan - 09/11/16 1550    Clinical Impression Statement Pt continues to progress with LUE coordination.  Sensation continues to be a barrier.   Rehab Potential Good   OT Frequency 2x / week   OT Duration --  8 Visits + Eval   OT Treatment/Interventions Self-care/ADL training;Parrafin;Therapeutic exercise;DME and/or AE instruction;Therapeutic activities;Patient/family education;Neuromuscular education;Therapeutic exercises;Passive range of motion;Moist Heat;Fluidtherapy;Manual Therapy;Splinting   Plan  review theraband HEP, continue with coordination, begin checking goals with anticipated d/c next week   OT Home Exercise Plan Education provided:   Coordination HEP   Consulted and Agree with Plan of Care Patient      Patient will benefit from skilled therapeutic intervention in order to improve the following deficits and impairments:  Decreased coordination, Decreased strength, Impaired UE functional use, Impaired sensation  Visit Diagnosis: Hemiplegia 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 Alteration of sensation as late effect of cerebrovascular accident (CVA) 09/10/2016  . Hemiparesis affecting nondominant side as late effect of cerebrovascular accident (CVA) ([place]) 06/11/2016  . Left-sided weakness 05/16/2016  . Right Carotid artery occlusion with infarction ([place]) 05/16/2016  . Essential hypertension 05/16/2016  . Hyperlipidemia LDL goal <70 05/16/2016  . Cigarette smoker 05/16/2016  . Overweight (BMI 25.0-29.9) 05/16/2016  . 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 Acute respiratory failure ([place]) 05/16/2016  . 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 Gait disturbance, post-stroke   . Hypokalemia   . Hypophosphatemia   . Cerebrovascular accident (CVA) due to thrombosis of right carotid artery [facility]) s/p IV tPA, R ICA stent placement, thrombectomy     [facility] 09/11/2016, 4:31 PM  [facility] [address] [place], [place], [address] Phone: [phone]   Fax:  [phone]  Name: DEMETRICS UREN MRN: [id number] Date of Birth: [birth year]   Vianne Bulls, OTR/L [facility] [address] [place], [place]  [address] [phone]  phone [phone] 09/11/16 4:31 PM

## 2016-09-11 NOTE — Patient Instructions (Addendum)
Strengthening: Resisted Flexion   Attach tube to door.  Hold tubing with one arm at side. Pull forward and up. Move shoulder through pain-free range of motion. Repeat 10-15 times per set.  Do 1-2 sessions per day.    Strengthening: Resisted Extension   Attach one end to door.  Hold tubing in one hand, arm forward. Pull arm back, elbow straight. Repeat 10-15 times per set. Do 1-2 sessions per day.   Resisted Horizontal Abduction: Bilateral   Sit or stand, tubing in both hands, arms out in front (belly button level). Keeping arms straight, pinch shoulder blades together and stretch arms out. Repeat 10-15 times per set.  Do 1-2 sessions per day.       Elbow Extension: Resisted   Hold band in right hand with elbow bent. Straighten  left elbow. Repeat 09-1514 times per set.  Do 1-2 sessions per day.        Rotating 2 golf balls in your left hand (both directions) Flipping/twirling card between each finger Rotate tennis-sized ball in fingertips (both directions)

## 2016-09-13 ENCOUNTER — Ambulatory Visit: Payer: 59 | Admitting: Occupational Therapy

## 2016-09-13 DIAGNOSIS — I69354 Hemiplegia and hemiparesis following cerebral infarction affecting left non-dominant side: Secondary | ICD-10-CM | POA: Diagnosis not present

## 2016-09-13 DIAGNOSIS — R278 Other lack of coordination: Secondary | ICD-10-CM

## 2016-09-13 DIAGNOSIS — R208 Other disturbances of skin sensation: Secondary | ICD-10-CM

## 2016-09-13 NOTE — Therapy (Signed)
[facility] [address] [place], [place], [address] Phone: [phone]   Fax:  [phone]  Occupational Therapy Treatment  Patient Details  Name: Johnathan [name] MRN: [id number] Date of Birth: 1951-08-19 Referring Provider: Dr. Alysia Penna  Encounter Date: 09/13/2016      OT End of Session - 09/13/16 1552    Visit Number 8   Number of Visits 9   Date for OT Re-Evaluation 09/17/16   Authorization Type [facility] / Medicare (g-code needed)   Authorization Time Period cert. 07/31/16-09/29/16   Authorization - Visit Number 8   Authorization - Number of Visits 10   OT Start Time 8366  pt arrived late   OT Stop Time 1618   OT Time Calculation (min) 38 min   Activity Tolerance Patient tolerated treatment well   Behavior During Therapy WFL for tasks assessed/performed      Past Medical History:  Diagnosis Date  . Stroke [facility])     Past Surgical History:  Procedure Laterality Date  . knee arthoscopy    . LEG SURGERY    . RADIOLOGY WITH ANESTHESIA N/A 05/12/2016   Procedure: RADIOLOGY WITH ANESTHESIA;  Surgeon: Luanne Bras, MD;  Location: [facility];  Service: Radiology;  Laterality: N/A;    There were no vitals filed for this visit.      Subjective Assessment - 09/13/16 1551    Subjective  just been busy   Pertinent History CVA affecting L side with sensory deficits;  Pt reports hx of multiple L shoulder dislocations.   Limitations no driving, no use of power tools, decr sensation   Patient Stated Goals to be able to use hand more    Currently in Pain? No/denies          Functional reaching overhead to place small pegs in vertical pegboard with min cueing for copying/visual perceptual skills and min difficulty with coordination.  Reviewed yellow theraband HEP.  Pt returned demo each x15 with min v.c.            OT Treatments/Exercises (OP) - 09/13/16 0001      Fine Motor Coordination    O'Connor pegs placing pegs in pegboard with fingers with min difficulty and with tweezers with min-mod difficulty/drops (incr difficulty initially due to difficulty grading pinch)                OT Education - 09/13/16 1612    Education Details Recommended pt do simple constructional activities without power tools (painting, jigsaw puzzles, word searches, simple assembly around the house, putting models together, ect) due to visual perceptual changes.   Person(s) Educated Patient   Methods Explanation   Comprehension Verbalized understanding             OT Long Term Goals - 09/13/16 1645      OT LONG TERM GOAL #1   Title Pt will be independent with HEP for coordination and L shoulder strength.--check goals 09/29/16   Status Not Met     OT LONG TERM GOAL #2   Title Pt will improve coordination for ADLs as shown by improving time on 9-hole peg test by at least 8sec with L hand.   Baseline 33.81sec   Status New     OT LONG TERM GOAL #3   Title Pt will report incr ease releasing objects, no instances of crushing objects, or closing his hand in door for at least 1 week.   Period Weeks   Status Achieved  at approx this level  09/13/16     OT LONG TERM GOAL #4   Title Pt will be able to complete buttons mod I.   Period Weeks   Status On-going     OT LONG TERM GOAL #5   Title Pt will don shirt on first attempt at least 90% of the time.   Status Achieved  09/03/16 per pt report               Plan - 09/13/16 1552    Clinical Impression Statement Pt continues to progress with LUE coordination and compensation strategies for sensory deficits.  Pt demo difficulty with visual perceptal skills for copying/comstructional activities.   Rehab Potential Good   OT Frequency 2x / week   OT Duration --  8 visits +eval   OT Treatment/Interventions Self-care/ADL training;Parrafin;Therapeutic exercise;DME and/or AE instruction;Therapeutic activities;Patient/family  education;Neuromuscular education;Therapeutic exercises;Passive range of motion;Moist Heat;Fluidtherapy;Manual Therapy;Splinting   Plan check remaining goals and d/c next session   OT Home Exercise Plan Education provided:   Coordination HEP   Consulted and Agree with Plan of Care Patient      Patient will benefit from skilled therapeutic intervention in order to improve the following deficits and impairments:  Decreased coordination, Decreased strength, Impaired UE functional use, Impaired sensation  Visit Diagnosis: Hemiplegia 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 Alteration of sensation as late effect of cerebrovascular accident (CVA) 09/10/2016  . Hemiparesis affecting nondominant side as late effect of cerebrovascular accident (CVA) ([place]) 06/11/2016  . Left-sided weakness 05/16/2016  . Right Carotid artery occlusion with infarction ([place]) 05/16/2016  . Essential hypertension 05/16/2016  . Hyperlipidemia LDL goal <70 05/16/2016  . Cigarette smoker 05/16/2016  . Overweight (BMI 25.0-29.9) 05/16/2016  . 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 Acute respiratory failure ([place]) 05/16/2016  . 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 Gait disturbance, post-stroke   . Hypokalemia   . Hypophosphatemia   . Cerebrovascular accident (CVA) due to thrombosis of right carotid artery [facility] s/p IV tPA, R ICA stent placement, thrombectomy     [facility] 09/13/2016, 4:51 PM  [facility] [address] [place], [place], [address] Phone: [phone]   Fax:  [phone]  Name: PARDEEP PAUTZ MRN: [id number] Date of Birth: [birth year]

## 2016-09-17 ENCOUNTER — Other Ambulatory Visit (HOSPITAL_COMMUNITY): Payer: Self-pay | Admitting: Interventional Radiology

## 2016-09-17 ENCOUNTER — Ambulatory Visit (HOSPITAL_COMMUNITY)
Admission: RE | Admit: 2016-09-17 | Discharge: 2016-09-17 | Disposition: A | Discharge status: Home / Self Care | Payer: 59 | Source: Ambulatory Visit | Attending: Interventional Radiology | Admitting: Interventional Radiology

## 2016-09-17 ENCOUNTER — Encounter: Payer: Medicare Other | Admitting: Occupational Therapy

## 2016-09-17 DIAGNOSIS — I639 Cerebral infarction, unspecified: Secondary | ICD-10-CM

## 2016-09-17 DIAGNOSIS — I771 Stricture of artery: Secondary | ICD-10-CM

## 2016-09-17 DIAGNOSIS — R202 Paresthesia of skin: Secondary | ICD-10-CM | POA: Diagnosis not present

## 2016-09-17 HISTORY — PX: IR GENERIC HISTORICAL: IMG1180011

## 2016-09-18 ENCOUNTER — Ambulatory Visit (HOSPITAL_COMMUNITY)
Admission: RE | Admit: 2016-09-18 | Discharge: 2016-09-18 | Disposition: A | Discharge status: Home / Self Care | Payer: 59 | Source: Ambulatory Visit | Attending: Interventional Radiology | Admitting: Interventional Radiology

## 2016-09-18 ENCOUNTER — Ambulatory Visit: Payer: 59 | Admitting: Occupational Therapy

## 2016-09-18 DIAGNOSIS — I771 Stricture of artery: Secondary | ICD-10-CM | POA: Diagnosis not present

## 2016-09-18 DIAGNOSIS — R278 Other lack of coordination: Secondary | ICD-10-CM

## 2016-09-18 DIAGNOSIS — R208 Other disturbances of skin sensation: Secondary | ICD-10-CM

## 2016-09-18 DIAGNOSIS — I69354 Hemiplegia and hemiparesis following cerebral infarction affecting left non-dominant side: Secondary | ICD-10-CM

## 2016-09-18 DIAGNOSIS — I6523 Occlusion and stenosis of bilateral carotid arteries: Secondary | ICD-10-CM | POA: Diagnosis not present

## 2016-09-18 NOTE — Therapy (Signed)
[facility] [address] [place], [place], [address] Phone: [phone]   Fax:  [phone]  Occupational Therapy Treatment  Patient Details  Name: Johnathan [name] MRN: [id number] Date of Birth: 21-Jun-1951 Referring Provider: Dr. Alysia Penna  Encounter Date: 09/18/2016      OT End of Session - 09/18/16 1504    Visit Number 9   Number of Visits 9   Date for OT Re-Evaluation 09/17/16   Authorization Type [facility] / Medicare (g-code needed)   Authorization Time Period cert. 07/31/16-09/29/16   Authorization - Visit Number 9   Authorization - Number of Visits 10   OT Start Time 1450   OT Stop Time 1529   OT Time Calculation (min) 39 min   Activity Tolerance Patient tolerated treatment well   Behavior During Therapy WFL for tasks assessed/performed      Past Medical History:  Diagnosis Date  . Stroke [facility])     Past Surgical History:  Procedure Laterality Date  . knee arthoscopy    . LEG SURGERY    . RADIOLOGY WITH ANESTHESIA N/A 05/12/2016   Procedure: RADIOLOGY WITH ANESTHESIA;  Surgeon: Luanne Bras, MD;  Location: [facility];  Service: Radiology;  Laterality: N/A;    There were no vitals filed for this visit.      Subjective Assessment - 09/18/16 1452    Subjective  I have a doppler for my L carotid today   Pertinent History CVA affecting L side with sensory deficits;  Pt reports hx of multiple L shoulder dislocations.   Limitations no driving, no use of power tools, decr sensation   Patient Stated Goals to be able to use hand more    Currently in Pain? No/denies                      OT Treatments/Exercises (OP) - 09/18/16 0001      ADLs   Overall ADLs checked remaining goals and discussed progress.   UB Dressing Fastening/unfastening buttons along tabletop with min difficulty     Fine Motor Coordination   O'Connor pegs placing pegs in pegboard with tweezers with min  difficulty/drops    Purdue Pegboard with min difficulty/incr time for coordination     Sensation Exercises   Stereognosis Pulling various small items from rice bath with min-mod difficulty, pt able to find smaller objects today (small pegs, coins)                OT Education - 09/18/16 1948    Education Details Ways to continue sensory re-training at home (pulling things from rice bath, picking up objects with tweezers, incr functional use).   Person(s) Educated Patient   Methods Explanation   Comprehension Verbalized understanding             OT Long Term Goals - 09/18/16 1453      OT LONG TERM GOAL #1   Title Pt will be independent with HEP for coordination and L shoulder strength.--check goals 09/29/16   Status Achieved  09/18/16     OT LONG TERM GOAL #2   Title Pt will improve coordination for ADLs as shown by improving time on 9-hole peg test by at least 8sec with L hand.   Baseline 33.81sec   Status Partially Met  10/19/16  approximating 25.91sec (improved by 7.9sec)     OT LONG TERM GOAL #3   Title Pt will report incr ease releasing objects, no instances of crushing  objects, or closing his hand in door for at least 1 week.   Period Weeks   Status Achieved  at approx this level  09/13/16     OT LONG TERM GOAL #4   Title Pt will be able to complete buttons mod I.   Period Weeks   Status Achieved  10-11-2016     OT LONG TERM GOAL #5   Title Pt will don shirt on first attempt at least 90% of the time.   Status Achieved  09/03/16 per pt report               Plan - 10/11/2016 1505    Clinical Impression Statement Pt has made good progress with LUE coordination, awareness, and compensation strategies for sensory deficits.  Pt is appropriate for d/c at this time.   Rehab Potential Good   OT Frequency 2x / week   OT Duration --  8 visits +eval   OT Treatment/Interventions Self-care/ADL training;Parrafin;Therapeutic exercise;DME and/or AE  instruction;Therapeutic activities;Patient/family education;Neuromuscular education;Therapeutic exercises;Passive range of motion;Moist Heat;Fluidtherapy;Manual Therapy;Splinting   Plan d/c OT   OT Home Exercise Plan Education provided:   Coordination HEP   Consulted and Agree with Plan of Care Patient      Patient will benefit from skilled therapeutic intervention in order to improve the following deficits and impairments:  Decreased coordination, Decreased strength, Impaired UE functional use, Impaired sensation  Visit Diagnosis: Hemiplegia and hemiparesis following cerebral infarction affecting left non-dominant side (HCC)  Other lack of coordination  Other disturbances of skin sensation      G-Codes - October 11, 2016 1951    Functional Assessment Tool Used 9-hole peg test L-25.91sec, able to fasten/unfasten buttons   Functional Limitation Carrying, moving and handling objects   Carrying, Moving and Handling Objects Current Status [phone]) At least 1 percent but less than 20 percent impaired, limited or restricted   Carrying, Moving and Handling Objects Goal Status (W1027) At least 1 percent but less than 20 percent impaired, limited or restricted      Problem List Patient Active Problem List   Diagnosis Date Noted  . Alteration of sensation as late effect of cerebrovascular accident (CVA) 09/10/2016  . Hemiparesis affecting nondominant side as late effect of cerebrovascular accident (CVA) ([place]) 06/11/2016  . Left-sided weakness 05/16/2016  . Right Carotid artery occlusion with infarction ([place]) 05/16/2016  . Essential hypertension 05/16/2016  . Hyperlipidemia LDL goal <70 05/16/2016  . Cigarette smoker 05/16/2016  . Overweight (BMI 25.0-29.9) 05/16/2016  . 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 Acute respiratory failure ([place]) 05/16/2016  . 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 Gait disturbance, post-stroke   . Hypokalemia    . Hypophosphatemia   . Cerebrovascular accident (CVA) due to thrombosis of right carotid artery ([place]) s/p IV tPA, R ICA stent placement, thrombectomy     OCCUPATIONAL THERAPY DISCHARGE SUMMARY  Visits from Start of Care: 9  Current functional level related to goals / functional outcomes: See above   Remaining deficits: decr sensation and decr coordination affecting LUE functional use, L shoulder weakness (which may be due to old injuries), and decr visual perceptual skills   Education / Equipment: Pt instructed in the following:  HEP/updated HEP for coordination, sensation, and visual perceptual skills.  Pt also instructed in precautions for deficits.  Pt verbalized understanding of all education provided.   Plan: Patient agrees to discharge.  Patient goals were met. Patient is being discharged due to meeting the  stated rehab goals.  ?????          [facility] 09/18/2016, 7:56 PM  [facility] [address] [place], [place], [address] Phone: [phone]   Fax:  [phone]  Name: Johnathan [name] MRN: [id number] Date of Birth: June 24, 1951   Vianne Bulls, OTR/L [facility] [address] [place], [place]  [address] [phone] phone [phone] 09/18/16 7:56 PM

## 2016-09-18 NOTE — Progress Notes (Signed)
*  PRELIMINARY RESULTS* Vascular Ultrasound Carotid Duplex (Doppler) has been completed.  Findings suggest <50% right internal carotid artery stenosis and 1-39% left internal carotid artery stenosis. Vertebral arteries are patent with antegrade flow.  09/18/2016  Maudry Mayhew, BS, RVT, RDCS, RDMS

## 2016-09-19 ENCOUNTER — Encounter (HOSPITAL_COMMUNITY): Payer: Self-pay | Admitting: Interventional Radiology

## 2016-09-19 LAB — VAS US CAROTID
LEFT ECA DIAS: -32 cm/s
LEFT VERTEBRAL DIAS: 12 cm/s
Left CCA dist dias: -38 cm/s
Left CCA dist sys: -109 cm/s
Left CCA prox dias: 38 cm/s
Left CCA prox sys: 131 cm/s
Left ICA dist dias: -70 cm/s
Left ICA dist sys: -160 cm/s
Left ICA prox dias: -31 cm/s
Left ICA prox sys: -94 cm/s
RIGHT ECA DIAS: -49 cm/s
RIGHT VERTEBRAL DIAS: 13 cm/s
Right CCA prox dias: 20 cm/s
Right CCA prox sys: 84 cm/s
Right cca dist sys: -91 cm/s

## 2016-09-21 ENCOUNTER — Telehealth (HOSPITAL_COMMUNITY): Payer: Self-pay

## 2016-09-21 NOTE — Telephone Encounter (Signed)
Left message for pt to return call. AW 

## 2016-09-28 MED FILL — CITALOPRAM HBR 10 MG TABLET: 10 | 30 days supply | Qty: 30 | Fill #1

## 2016-10-05 ENCOUNTER — Telehealth (HOSPITAL_COMMUNITY): Payer: Self-pay

## 2016-10-05 MED FILL — RAMIPRIL 5 MG CAPSULE: 5 | 90 days supply | Qty: 90 | Fill #1

## 2016-10-05 NOTE — Telephone Encounter (Signed)
Pt's wife agreed to have pt f/u in 6 months with us carotid. AW 

## 2016-10-08 ENCOUNTER — Encounter: Payer: 59 | Attending: Physical Medicine & Rehabilitation

## 2016-10-08 ENCOUNTER — Encounter: Payer: Self-pay | Admitting: Physical Medicine & Rehabilitation

## 2016-10-08 ENCOUNTER — Ambulatory Visit (HOSPITAL_BASED_OUTPATIENT_CLINIC_OR_DEPARTMENT_OTHER): Payer: 59 | Admitting: Physical Medicine & Rehabilitation

## 2016-10-08 VITALS — BP 147/89 | HR 64 | Resp 14

## 2016-10-08 DIAGNOSIS — R269 Unspecified abnormalities of gait and mobility: Secondary | ICD-10-CM

## 2016-10-08 DIAGNOSIS — I63031 Cerebral infarction due to thrombosis of right carotid artery: Secondary | ICD-10-CM

## 2016-10-08 DIAGNOSIS — Z95828 Presence of other vascular implants and grafts: Secondary | ICD-10-CM | POA: Diagnosis not present

## 2016-10-08 DIAGNOSIS — M25562 Pain in left knee: Secondary | ICD-10-CM

## 2016-10-08 DIAGNOSIS — I69398 Other sequelae of cerebral infarction: Secondary | ICD-10-CM

## 2016-10-08 DIAGNOSIS — I69359 Hemiplegia and hemiparesis following cerebral infarction affecting unspecified side: Secondary | ICD-10-CM

## 2016-10-08 DIAGNOSIS — R209 Unspecified disturbances of skin sensation: Secondary | ICD-10-CM

## 2016-10-08 DIAGNOSIS — I639 Cerebral infarction, unspecified: Secondary | ICD-10-CM

## 2016-10-08 DIAGNOSIS — I69354 Hemiplegia and hemiparesis following cerebral infarction affecting left non-dominant side: Secondary | ICD-10-CM | POA: Insufficient documentation

## 2016-10-08 DIAGNOSIS — Z7982 Long term (current) use of aspirin: Secondary | ICD-10-CM | POA: Diagnosis not present

## 2016-10-08 DIAGNOSIS — Z5189 Encounter for other specified aftercare: Secondary | ICD-10-CM | POA: Insufficient documentation

## 2016-10-08 MED ORDER — DICLOFENAC SODIUM 1 % TD GEL: 2.0000 g | Freq: Four times a day (QID) | TRANSDERMAL | 2 refills | Status: DC

## 2016-10-08 MED ORDER — PANTOPRAZOLE SODIUM 40 MG PO TBEC: 40.0000 mg | DELAYED_RELEASE_TABLET | Freq: Two times a day (BID) | ORAL | 1 refills | Status: DC

## 2016-10-08 MED FILL — PANTOPRAZOLE SOD DR 40 MG T: 40 | 30 days supply | Qty: 60 | Fill #0

## 2016-10-08 MED FILL — DICLOFENAC SODIUM 1% GEL: 1 | 25 days supply | Qty: 200 | Fill #0

## 2016-10-08 NOTE — Progress Notes (Signed)
Subjective:    Patient ID: Johnathan [name], male    DOB: December 04, 1950, 65 y.o.   MRN: [id number] 65 year old right-handed male, history of tobacco abuse, on no prescription medications.  He is married.  Lives with his wife.  He worked at [employer].  Presented on 05/12/2016 with left-sided weakness.  Initial CT scan showed no definite acute findings.  MRI of the brain showed extensive acute subacute infarct involving the posterior right MCA territory.  Additional involvement of the medial posterior right frontal lobe along the posterior cingulate gyrus.  Remote infarct in the right occipital lobe.  CT angio of the head and neck showed occlusion of the right ICA in the bulb.  Interventional Radiology consulted, underwent stenting of right ICA.  Remained on ventilator for a short time. Echocardiogram with ejection fraction of 60%.  No wall motion abnormalities.  No emboli.  The patient did not receive tPA.   HPI Patient has finished inpatient, home health and outpatient therapy. Performing home access program. Has been cleared to drive, graduated instructions given last visit, however, has not found the time to do this. Asking about power tools. Would like to use chainsaw circular saw and Dremel  Wife has gone back to work, patient at home by himself during the day Pain Inventory Average Pain 0 Pain Right Now 0 My pain is n/a  In the last 24 hours, has pain interfered with the following? General activity n/a Relation with others n/a Enjoyment of life n/a What TIME of day is your pain at its worst? n/a Sleep (in general) Fair  Pain is worse with: n/a Pain improves with: n/a Relief from Meds: n/a  Mobility walk without assistance how many minutes can you walk? 20 ability to climb steps?  yes do you drive?  no  Function retired I need assistance with the following:  dressing  Neuro/Psych numbness tingling loss of taste or smell  Prior Studies Any changes since  last visit?  no  Physicians involved in your care Any changes since last visit?  no   No family history on file. Social History   Social History  . Marital status: Married    Spouse name: N/A  . Number of children: N/A  . Years of education: N/A   Social History Main Topics  . Smoking status: Former Smoker    Packs/day: 1.00    Types: Cigarettes    Quit date: 05/21/2016  . Smokeless tobacco: Never Used  . Alcohol use No  . Drug use: No  . Sexual activity: Not Asked   Other Topics Concern  . None   Social History Narrative  . None   Past Surgical History:  Procedure Laterality Date  . IR GENERIC HISTORICAL  09/17/2016   IR RADIOLOGIST EVAL & MGMT 09/17/2016 [facility]-INTERV RAD  . knee arthoscopy    . LEG SURGERY    . RADIOLOGY WITH ANESTHESIA N/A 05/12/2016   Procedure: RADIOLOGY WITH ANESTHESIA;  Surgeon: Luanne Bras, MD;  Location: [facility];  Service: Radiology;  Laterality: N/A;   Past Medical History:  Diagnosis Date  . Stroke ([place])    BP (!) 147/89   Pulse 64   Resp 14   SpO2 96%   Opioid Risk Score:   Fall Risk Score:  `1  Depression screen PHQ 2/9  Depression screen PHQ 2/9 06/11/2016  Decreased Interest 3  Down, Depressed, Hopeless 2  PHQ - 2 Score 5  Altered sleeping 0  Tired, decreased energy 3  Change in appetite 0  Feeling bad or failure about yourself  2  Trouble concentrating 0  Moving slowly or fidgety/restless 0  Suicidal thoughts 0  PHQ-9 Score 10    Review of Systems  All other systems reviewed and are negative.      Objective:   Physical Exam  Constitutional: He is oriented to person, place, and time. He appears well-developed and well-nourished.  HENT:  Head: Normocephalic and atraumatic.  Eyes: Conjunctivae are normal. Pupils are equal, round, and reactive to light.  Neck: Normal range of motion.  Neurological: He is alert and oriented to person, place, and time.  Motor strength is 4 plus in the left deltoid, bicep,  triceps, grip, 5 in the right deltoid, bicep, tricep, grip 5/5 bilateral hip flexor, knee extensor, ankle dorsal flexor. Sensation intact to light touch in bilateral upper limbs. Visual fields are intact confrontation testing. Gait is normal  Psychiatric: He has a normal mood and affect.  Nursing note and vitals reviewed.         Assessment & Plan:  1. History of right ICA distribution infarct. He is about 5 months post stroke. He's had very good recovery. Recommend continue home access program May use chainsaw if somebody is at home , but needs someone to supervise him the first time he tries again. Also, advised only working for short periods of time  No circular saw our use  May use Dremel if someone is home  Over half of the 25 min visit was spent counseling and coordinating care.  2. Left knee pain, likely osteoarthritis medial joint line. Advised Voltaren gel 4 times a day Follow-up in 6 weeks. If no improvement, consider x-rays and injection

## 2016-10-08 NOTE — Patient Instructions (Addendum)
Graduated return to driving instructions were provided. It is recommended that the patient first drives with another licensed driver in an empty parking lot. If the patient does well with this, and they can drive on a quiet street with the licensed driver. If the patient does well with this they can drive on a busy street with a licensed driver. If the patient does well with this, the next time out they can go by himself. For the first month after resuming driving, I recommend no nighttime or Interstate driving.   May use chainsaw if somebody is home May use Dremel No Circular saw

## 2016-10-09 ENCOUNTER — Telehealth: Payer: Self-pay | Admitting: Physical Medicine & Rehabilitation

## 2016-10-09 NOTE — Telephone Encounter (Signed)
Johnathan [name] with [name] need to report a drug interaction between Plavix and Voltaren Gel.  Please call them at [phone].

## 2016-10-10 NOTE — Telephone Encounter (Signed)
Spoke with pharmacy and it was just an [place].

## 2016-10-16 DIAGNOSIS — E7800 Pure hypercholesterolemia, unspecified: Secondary | ICD-10-CM | POA: Diagnosis not present

## 2016-10-16 DIAGNOSIS — I6930 Unspecified sequelae of cerebral infarction: Secondary | ICD-10-CM | POA: Diagnosis not present

## 2016-10-16 DIAGNOSIS — Z23 Encounter for immunization: Secondary | ICD-10-CM | POA: Diagnosis not present

## 2016-10-16 DIAGNOSIS — I10 Essential (primary) hypertension: Secondary | ICD-10-CM | POA: Diagnosis not present

## 2016-10-16 DIAGNOSIS — K219 Gastro-esophageal reflux disease without esophagitis: Secondary | ICD-10-CM | POA: Diagnosis not present

## 2016-10-16 DIAGNOSIS — G4700 Insomnia, unspecified: Secondary | ICD-10-CM | POA: Diagnosis not present

## 2016-10-16 DIAGNOSIS — F324 Major depressive disorder, single episode, in partial remission: Secondary | ICD-10-CM | POA: Diagnosis not present

## 2016-10-22 MED FILL — CLOPIDOGREL 75 MG TABLET: 75 | 90 days supply | Qty: 90 | Fill #1

## 2016-10-31 MED FILL — ATORVASTATIN 10 MG TABLET: 10 | 90 days supply | Qty: 90 | Fill #1

## 2016-10-31 MED FILL — CITALOPRAM HBR 10 MG TABLET: 10 | 90 days supply | Qty: 90 | Fill #0

## 2016-10-31 MED FILL — PANTOPRAZOLE SOD DR 40 MG T: 40 | 30 days supply | Qty: 60 | Fill #1

## 2016-11-14 ENCOUNTER — Telehealth: Payer: Self-pay | Admitting: *Deleted

## 2016-11-14 NOTE — Telephone Encounter (Signed)
STROKE-AF research 6 month follow up telephone call attempted. Left message for patient to call research office.

## 2016-11-19 ENCOUNTER — Ambulatory Visit (HOSPITAL_BASED_OUTPATIENT_CLINIC_OR_DEPARTMENT_OTHER): Payer: 59 | Admitting: Physical Medicine & Rehabilitation

## 2016-11-19 ENCOUNTER — Encounter: Payer: 59 | Attending: Physical Medicine & Rehabilitation

## 2016-11-19 ENCOUNTER — Encounter: Payer: Self-pay | Admitting: Physical Medicine & Rehabilitation

## 2016-11-19 VITALS — BP 128/77 | HR 66

## 2016-11-19 DIAGNOSIS — I69354 Hemiplegia and hemiparesis following cerebral infarction affecting left non-dominant side: Secondary | ICD-10-CM | POA: Insufficient documentation

## 2016-11-19 DIAGNOSIS — Z7982 Long term (current) use of aspirin: Secondary | ICD-10-CM | POA: Diagnosis not present

## 2016-11-19 DIAGNOSIS — I69398 Other sequelae of cerebral infarction: Secondary | ICD-10-CM

## 2016-11-19 DIAGNOSIS — R209 Unspecified disturbances of skin sensation: Secondary | ICD-10-CM

## 2016-11-19 DIAGNOSIS — I639 Cerebral infarction, unspecified: Secondary | ICD-10-CM

## 2016-11-19 DIAGNOSIS — Z5189 Encounter for other specified aftercare: Secondary | ICD-10-CM | POA: Insufficient documentation

## 2016-11-19 DIAGNOSIS — I69359 Hemiplegia and hemiparesis following cerebral infarction affecting unspecified side: Secondary | ICD-10-CM

## 2016-11-19 DIAGNOSIS — Z95828 Presence of other vascular implants and grafts: Secondary | ICD-10-CM | POA: Diagnosis not present

## 2016-11-19 DIAGNOSIS — I63031 Cerebral infarction due to thrombosis of right carotid artery: Secondary | ICD-10-CM | POA: Diagnosis not present

## 2016-11-19 NOTE — Progress Notes (Signed)
Subjective:    Patient ID: Johnathan [name], male    DOB: 12-16-1950, 65 y.o.   MRN: [id number]  HPI Has driven twice since last visit , went ok. Left knee pain better since started on Voltaren gel Occ tingling in front of thigh and foot Occ tingling and numbness in hand These sensations are not painful Pain Inventory Average Pain 0 Pain Right Now 0 My pain is na  In the last 24 hours, has pain interfered with the following? General activity 0 Relation with others 0 Enjoyment of life 0 What TIME of day is your pain at its worst? na Sleep (in general) Good  Pain is worse with: na Pain improves with: na Relief from Meds: na  Mobility walk without assistance walk with assistance use a cane ability to climb steps?  yes  Function retired  Neuro/Psych numbness depression anxiety loss of taste or smell  Prior Studies Any changes since last visit?  no  Physicians involved in your care Any changes since last visit?  no   No family history on file. Social History   Social History  . Marital status: Married    Spouse name: N/A  . Number of children: N/A  . Years of education: N/A   Social History Main Topics  . Smoking status: Former Smoker    Packs/day: 1.00    Types: Cigarettes    Quit date: 05/21/2016  . Smokeless tobacco: Never Used  . Alcohol use No  . Drug use: No  . Sexual activity: Not on file   Other Topics Concern  . Not on file   Social History Narrative  . No narrative on file   Past Surgical History:  Procedure Laterality Date  . IR GENERIC HISTORICAL  09/17/2016   IR RADIOLOGIST EVAL & MGMT 09/17/2016 [facility]-INTER[facility]  . knee arthoscopy    . LEG SURGERY    . RADIOLOGY WITH ANESTHESIA N/A 05/12/2016   Procedure: RADIOLOGY WITH ANESTHESIA;  Surgeon: Luanne Bras, MD;  Location: [facility];  Service: Radiology;  Laterality: N/A;   Past Medical History:  Diagnosis Date  . Stroke [facility])    There were no vitals taken for this  visit.  Opioid Risk Score:   Fall Risk Score:  `1  Depression screen PHQ 2/9  Depression screen PHQ 2/9 06/11/2016  Decreased Interest 3  Down, Depressed, Hopeless 2  PHQ - 2 Score 5  Altered sleeping 0  Tired, decreased energy 3  Change in appetite 0  Feeling bad or failure about yourself  2  Trouble concentrating 0  Moving slowly or fidgety/restless 0  Suicidal thoughts 0  PHQ-9 Score 10   Review of Systems  Constitutional: Negative.   HENT: Negative.   Eyes: Negative.   Respiratory: Negative.   Cardiovascular: Negative.   Gastrointestinal: Negative.   Endocrine: Negative.   Genitourinary: Negative.   Musculoskeletal: Negative.   Skin: Negative.   Allergic/Immunologic: Negative.   Neurological: Positive for numbness.       Loss taste/smell  Hematological: Negative.   Psychiatric/Behavioral: Positive for dysphoric mood. The patient is nervous/anxious.   All other systems reviewed and are negative.      Objective:   Physical Exam  Constitutional: He is oriented to person, place, and time. He appears well-developed and well-nourished.  HENT:  Head: Normocephalic and atraumatic.  Eyes: Conjunctivae and EOM are normal. Pupils are equal, round, and reactive to light.  Neck: Normal range of motion.  Neurological: He is alert and oriented  to person, place, and time.  Skin: Skin is warm and dry.  Nursing note and vitals reviewed.  5/5 right deltoid, bicep, triceps, grip, hip flexor, knee extensor, ankle dorsiflexor 5 minus in the left deltoid, bicep, tricep, grip 5/5 in the left hip flexor, knee extensor, ankle dorsal flexor. Sensation to pinprick intact. Left upper extremity versus right upper extremity. Still feels some difference in sensation bilaterally but localization is intact. Visual fields are intact. Extraocular muscles intact. Gait without evidence of toe drag or knee instability Mild dysdiadochokinesis with rapid alternating supination and pronation of  the left upper extremity      Assessment & Plan:  1. Right frontal infarct, status post ICA stenting with residual mild left upper extremity weakness and mild incoordination Do not think patient needs any formal PT or OT at this time. I have encouraged him to continue his home access program. 2. History of posterior depression. Continue Celexa. This is now prescribed by his PCP. 3. Left knee pain, likely OA improved with Voltaren gel 4 times a day, his PCP, we'll assume this prescription as well F/u PMR on prn basis if he experiences a slow decline of his functioning. If he experiences a rapid decline in function would recommend called 911

## 2016-11-19 NOTE — Patient Instructions (Signed)
Please keep up with her home exercise program, remain active as possible, but pace  yourself since your endurance is not what it used to be

## 2016-11-20 ENCOUNTER — Encounter: Payer: Self-pay | Admitting: *Deleted

## 2016-11-20 DIAGNOSIS — Z006 Encounter for examination for normal comparison and control in clinical research program: Secondary | ICD-10-CM

## 2016-11-22 NOTE — Progress Notes (Signed)
STROKE-AF Research study month 6 follow up completed. (see source)

## 2016-11-29 ENCOUNTER — Encounter: Payer: Self-pay | Admitting: Family Medicine

## 2016-12-11 MED FILL — PANTOPRAZOLE SOD DR 40 MG T: 40 | 90 days supply | Qty: 90 | Fill #1

## 2016-12-11 MED FILL — DICLOFENAC SODIUM 1% GEL: 1 | 25 days supply | Qty: 200 | Fill #1

## 2017-01-04 MED FILL — RAMIPRIL 2.5 MG CAPSULE: 2.5 | 30 days supply | Qty: 30 | Fill #1

## 2017-01-17 DIAGNOSIS — E7800 Pure hypercholesterolemia, unspecified: Secondary | ICD-10-CM | POA: Diagnosis not present

## 2017-01-17 DIAGNOSIS — I10 Essential (primary) hypertension: Secondary | ICD-10-CM | POA: Diagnosis not present

## 2017-01-17 DIAGNOSIS — G4700 Insomnia, unspecified: Secondary | ICD-10-CM | POA: Diagnosis not present

## 2017-01-17 DIAGNOSIS — F324 Major depressive disorder, single episode, in partial remission: Secondary | ICD-10-CM | POA: Diagnosis not present

## 2017-01-17 DIAGNOSIS — K219 Gastro-esophageal reflux disease without esophagitis: Secondary | ICD-10-CM | POA: Diagnosis not present

## 2017-01-17 DIAGNOSIS — I6930 Unspecified sequelae of cerebral infarction: Secondary | ICD-10-CM | POA: Diagnosis not present

## 2017-01-17 MED FILL — CLOPIDOGREL 75 MG TABLET: 75 | 90 days supply | Qty: 90 | Fill #0

## 2017-01-17 MED FILL — RAMIPRIL 5 MG CAPSULE: 5 | 90 days supply | Qty: 90 | Fill #0

## 2017-01-28 ENCOUNTER — Encounter: Payer: Self-pay | Admitting: Neurology

## 2017-01-28 ENCOUNTER — Ambulatory Visit (INDEPENDENT_AMBULATORY_CARE_PROVIDER_SITE_OTHER): Payer: 59 | Admitting: Neurology

## 2017-01-28 VITALS — BP 116/78 | HR 82 | Ht 66.0 in | Wt 175.4 lb

## 2017-01-28 DIAGNOSIS — I63131 Cerebral infarction due to embolism of right carotid artery: Secondary | ICD-10-CM

## 2017-01-28 MED FILL — CITALOPRAM HBR 10 MG TABLET: 10 | 90 days supply | Qty: 90 | Fill #1

## 2017-01-28 MED FILL — DICLOFENAC SODIUM 1% GEL: 1 | 25 days supply | Qty: 200 | Fill #2

## 2017-01-28 NOTE — Progress Notes (Signed)
[facility] [address] [place]. [place] [address] [phone]       OFFICE FOLLOW-UP NOTE  Johnathan. Johnathan [name] Date of Birth:  05/07/1951 Medical Record Number:  [id number]   HPI: Initial visit 07/30/2016 Johnathan [name] is a 41 year Caucasian male seen today for first offcie f/u visit after [facility] admission for stroke in June 2017.He is accompanied by his wife today.Johnathan [name] is a 66 y.o. male who was in his normal state of health earlier working outside in the lawn. He had a syncopal episode At 1705 on 05/12/2016 (LKW). EMS was called and arrived on scene for the syncope. After their arrival, at 1728 he developed severe left-sided weakness.They called a code stroke and transported him to the ER where he was found to have a left hemiparesis with neglect. He was given IV tPA and and taken to IR where he had  superselective infusion of 5mg  of tpa and endovascul;ar revascularization of symptomatic occluded RT ICA prox with stent assisted angioplasty ,and infusion of 18 mg of IA Integrelin and 9 mg of Reopro into intrastent thrombosis with gradual clearance of filling defects   He was kept in the intensive care unit and blood pressure was tightly controlled. MRI scan of the brain showed extensive acute and subacute infarcts involving posterior division of the right MCA with slight involvement of the medial aspect of the posterior right frontal lobe and cingulate gyrus. Transthoracic echo showed normal ejection fraction. LDL cholesterol 70 mg percent and hemoglobin A1c was 5.6. Patient was started on dual antiplatelet therapy because of his carotid stent and advised to quit smoking which he has done.Marland Kitchen He was transferred to inpatient rehabilitation for ongoing therapy needs and did well there and has now been discharged home. He is able to walk independently without assistance. He still has mild weakness of his left hand and some residual numbness in his left hand and leg. He states he tires  more easily and stamina is nondistended. He also has some Percocet to deficits in his left hand. He has quit smoking. He is tolerating aspirin and Plavix with minor bruising in his skin but no bleeding episodes. His blood pressure is well controlled though it is borderline today at 142/80. Is tolerating Lipitor without muscle aches or pains. Patient to has finished therapy at home and plans to start outpatient therapy soon. Update 01/28/2017 : He returns for follow-up after last visit 6 months ago. He continues to do well but does have some mild left-sided weakness, heaviness as well as tingling particularly when he started and has worked hard. He still feels his left side is not as strong as the right. Patient continues to tolerate Plavix well without bleeding or bruising. His blood pressure is well controlled today it is 116/78. He is tolerating Lipitor well without muscle aches and pains. The patient has not yet lost any weight in fact may have gained a few pounds. He has not been on a strict diet and not following her regular exercise regimen as well. He has no new complaints today. ROS:   14 system review of systems is positive for   frequent waking, daytime sleepiness, snoring only and all other systems negative PMH:  Past Medical History:  Diagnosis Date  . Stroke [facility])     Social History:  Social History   Social History  . Marital status: Married    Spouse name: N/A  . Number of children: N/A  . Years of education: N/A  Occupational History  . Not on file.   Social History Main Topics  . Smoking status: Former Smoker    Packs/day: 1.00    Types: Cigarettes    Quit date: 05/21/2016  . Smokeless tobacco: Never Used  . Alcohol use No  . Drug use: No  . Sexual activity: Not on file   Other Topics Concern  . Not on file   Social History Narrative  . No narrative on file    Medications:   Current Outpatient Prescriptions on File Prior to Visit  Medication Sig Dispense  Refill  . atorvastatin (LIPITOR) 10 MG tablet TAKE 1 TABLET BY MOUTH ONCE DAILY AT 6 PM 30 tablet 0  . citalopram (CELEXA) 10 MG tablet Take 1 tablet (10 mg total) by mouth daily. 30 tablet 1  . clopidogrel (PLAVIX) 75 MG tablet TAKE 1 TABLET BY MOUTH ONCE DAILY WITH BREAKFAST 30 tablet 0  . diclofenac sodium (VOLTAREN) 1 % GEL Apply 2 g topically 4 (four) times daily. 2 g 2  . pantoprazole (PROTONIX) 40 MG tablet Take 1 tablet (40 mg total) by mouth 2 (two) times daily. 60 tablet 1   No current facility-administered medications on file prior to visit.     Allergies:  No Known Allergies  Physical Exam General: well developed, well nourished middle-age Caucasian male, seated, in no evident distress Head: head normocephalic and atraumatic.  Neck: supple with no carotid or supraclavicular bruits Cardiovascular: regular rate and rhythm, no murmurs Musculoskeletal: no deformity Skin:  no rash/petichiae Vascular:  Normal pulses all extremities Vitals:   01/28/17 1641  BP: 116/78  Pulse: 82   Neurologic Exam Mental Status: Awake and fully alert. Oriented to place and time. Recent and remote memory intact. Attention span, concentration and fund of knowledge appropriate. Mood and affect appropriate.  Cranial Nerves: Fundoscopic examNot donetive to light. Extraocular movements full without nystagmus. Visual fields full to confrontation. Hearing intact. Facial sensation intact. Face, tongue, palate moves normally and symmetrically.  Motor: Normal bulk and tone. Normal strength in all tested extremity muscles.Mild weakness left grip and intrinsic hand muscles. Orbits right over left upper extremity. Fine finger movements are diminished on the left.mildly weakness of left ankle dorsiflexors  Sensory.: intact to touch ,pinprick .position and vibratory sensation Except for slight diminished sensation in the left hand and left leg..  Coordination: Rapid alternating movements normal in all extremities.  Finger-to-nose and heel-to-shin performed accurately bilaterally. Gait and Station: Arises from chair without difficulty. Stance is normal. Gait demonstrates normal stride length and balance . Able to heel, toe and tandem walk without difficulty.  Reflexes: 1+ and symmetric. Toes downgoing.   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 Multiple vascular risk factors of hypertension, hyperlipidemia, smoking, obesity. Patient is doing extremely well with significant functional improvement    PLAN: I had a long d/w patient and his wife about his recent stroke from right carotid occlusion, risk for recurrent stroke/TIAs, personally independently reviewed imaging studies and stroke evaluation results and answered questions.Continue Plavix for secondary stroke prevention and maintain strict control of hypertension with blood pressure goal below 130/90, diabetes with hemoglobin A1c goal below 6.5% and lipids with LDL cholesterol goal below 70 mg/dL. I also advised the patient to eat a healthy diet with plenty of whole grains,  cereals, fruits and vegetables, exercise regularly and maintain ideal body weight Followup in the future with my nurse practitioner in 6 months or call earlier if necessary.Greater than 50% of time during this 25 minute visit was spent on counseling about stroke recurrence risk, need for risk factor modification and particularly diet, exercise and weight loss and answering questions  Antony Contras, MD  [facility] [address] [place], [place] [address]  Phone [phone] Fax [phone] Note: This document was prepared with digital dictation and possible smart phrase technology. Any transcriptional errors that result from this  process are unintentional

## 2017-01-28 NOTE — Patient Instructions (Signed)
I had a long d/w patient and his wife about his recent stroke from right carotid occlusion, risk for recurrent stroke/TIAs, personally independently reviewed imaging studies and stroke evaluation results and answered questions.Continue Plavix for secondary stroke prevention and maintain strict control of hypertension with blood pressure goal below 130/90, diabetes with hemoglobin A1c goal below 6.5% and lipids with LDL cholesterol goal below 70 mg/dL. I also advised the patient to eat a healthy diet with plenty of whole grains, cereals, fruits and vegetables, exercise regularly and maintain ideal body weight Followup in the future with my nurse practitioner in 6 months or call earlier if necessary  Stroke Prevention Some medical conditions and behaviors are associated with an increased chance of having a stroke. You may prevent a stroke by making healthy choices and managing medical conditions. How can I reduce my risk of having a stroke?  Stay physically active. Get at least 30 minutes of activity on most or all days.  Do not smoke. It may also be helpful to avoid exposure to secondhand smoke.  Limit alcohol use. Moderate alcohol use is considered to be:  No more than 2 drinks per day for men.  No more than 1 drink per day for nonpregnant women.  Eat healthy foods. This involves:  Eating 5 or more servings of fruits and vegetables a day.  Making dietary changes that address high blood pressure (hypertension), high cholesterol, diabetes, or obesity.  Manage your cholesterol levels.  Making food choices that are high in fiber and low in saturated fat, trans fat, and cholesterol may control cholesterol levels.  Take any prescribed medicines to control cholesterol as directed by your health care provider.  Manage your diabetes.  Controlling your carbohydrate and sugar intake is recommended to manage diabetes.  Take any prescribed medicines to control diabetes as directed by your health  care provider.  Control your hypertension.  Making food choices that are low in salt (sodium), saturated fat, trans fat, and cholesterol is recommended to manage hypertension.  Ask your health care provider if you need treatment to lower your blood pressure. Take any prescribed medicines to control hypertension as directed by your health care provider.  If you are 76-8 years of age, have your blood pressure checked every 3-5 years. If you are 19 years of age or older, have your blood pressure checked every year.  Maintain a healthy weight.  Reducing calorie intake and making food choices that are low in sodium, saturated fat, trans fat, and cholesterol are recommended to manage weight.  Stop drug abuse.  Avoid taking birth control pills.  Talk to your health care provider about the risks of taking birth control pills if you are over 40 years old, smoke, get migraines, or have ever had a blood clot.  Get evaluated for sleep disorders (sleep apnea).  Talk to your health care provider about getting a sleep evaluation if you snore a lot or have excessive sleepiness.  Take medicines only as directed by your health care provider.  For some people, aspirin or blood thinners (anticoagulants) are helpful in reducing the risk of forming abnormal blood clots that can lead to stroke. If you have the irregular heart rhythm of atrial fibrillation, you should be on a blood thinner unless there is a good reason you cannot take them.  Understand all your medicine instructions.  Make sure that other conditions (such as anemia or atherosclerosis) are addressed. Get help right away if:  You have sudden weakness or numbness of  the face, arm, or leg, especially on one side of the body.  Your face or eyelid droops to one side.  You have sudden confusion.  You have trouble speaking (aphasia) or understanding.  You have sudden trouble seeing in one or both eyes.  You have sudden trouble  walking.  You have dizziness.  You have a loss of balance or coordination.  You have a sudden, severe headache with no known cause.  You have new chest pain or an irregular heartbeat. Any of these symptoms may represent a serious problem that is an emergency. Do not wait to see if the symptoms will go away. Get medical help at once. Call your local emergency services (911 in [place] Do not drive yourself to the hospital.  This information is not intended to replace advice given to you by your health care provider. Make sure you discuss any questions you have with your health care provider. Document Released: 12/27/2004 Document Revised: 04/26/2016 Document Reviewed: 05/22/2013 Elsevier Interactive Patient Education  2017 [employer].

## 2017-01-29 MED FILL — ATORVASTATIN 10 MG TABLET: 10 | 90 days supply | Qty: 90 | Fill #0

## 2017-02-14 ENCOUNTER — Encounter: Payer: Self-pay | Admitting: Nurse Practitioner

## 2017-02-21 ENCOUNTER — Encounter: Payer: Self-pay | Admitting: Nurse Practitioner

## 2017-02-21 ENCOUNTER — Ambulatory Visit (INDEPENDENT_AMBULATORY_CARE_PROVIDER_SITE_OTHER): Payer: 59 | Admitting: Nurse Practitioner

## 2017-02-21 VITALS — BP 108/70 | HR 86 | Ht 66.0 in | Wt 170.0 lb

## 2017-02-21 DIAGNOSIS — Z1211 Encounter for screening for malignant neoplasm of colon: Secondary | ICD-10-CM | POA: Diagnosis not present

## 2017-02-21 DIAGNOSIS — K219 Gastro-esophageal reflux disease without esophagitis: Secondary | ICD-10-CM | POA: Diagnosis not present

## 2017-02-21 DIAGNOSIS — I63131 Cerebral infarction due to embolism of right carotid artery: Secondary | ICD-10-CM

## 2017-02-21 MED ORDER — SUCRALFATE 1 G PO TABS: 1.0000 g | ORAL_TABLET | Freq: Three times a day (TID) | ORAL | 1 refills | Status: DC

## 2017-02-21 MED ORDER — PANTOPRAZOLE SODIUM 40 MG PO TBEC: 40.0000 mg | DELAYED_RELEASE_TABLET | Freq: Two times a day (BID) | ORAL | 1 refills | Status: DC

## 2017-02-21 MED FILL — SUCRALFATE 1 GM TABLET: 1 | 30 days supply | Qty: 90 | Fill #0

## 2017-02-21 MED FILL — PANTOPRAZOLE SOD DR 40 MG T: 40 | 30 days supply | Qty: 60 | Fill #0

## 2017-02-21 NOTE — Patient Instructions (Addendum)
If you are age 66 or older, your body mass index should be between 23-30. Your Body mass index is 27.44 kg/m. If this is out of the aforementioned range listed, please consider follow up with your Primary Care Provider.  If you are age 60 or younger, your body mass index should be between 19-25. Your Body mass index is 27.44 kg/m. If this is out of the aformentioned range listed, please consider follow up with your Primary Care Provider.   We have sent the following medications to your pharmacy for you to pick up at your convenience:  Protonix  Carafate  Please follow up with Johnathan [name] in 3-4 weeks. This is scheduled for 03/14/17 at 2:00pm.  Johnathan [name] will contact you after speaking to Dr. Leonie Man about holding your Plavix prior to your endoscopic procedure.  Thank you.

## 2017-02-21 NOTE — Progress Notes (Addendum)
HPI:  Patient is a 66 year old male here for evaluation of GERD. He was seen her many years ago by Dr. Velora Heckler. Patient took Prilosec or Nexium for years with fairly good control of reflux symptoms. In June of this year he had a CVA, underwent stenting and started on plavix. Subsequently he was changed from priloses to protonix. Since that time he has had recurrent heartburn almost on a daily basis. In September Protonix was increased to twice a day without any improvement. He has frequent belching, burning in his chest. No dysphasia. No chest pains or shortness of breath.   Past Medical History:  Diagnosis Date  . Stroke [facility])     Past Surgical History:  Procedure Laterality Date  . IR GENERIC HISTORICAL  09/17/2016   IR RADIOLOGIST EVAL & MGMT 09/17/2016 [facility]-INTERV RAD  . knee arthoscopy    . LEG SURGERY    . RADIOLOGY WITH ANESTHESIA N/A 05/12/2016   Procedure: RADIOLOGY WITH ANESTHESIA;  Surgeon: Luanne Bras, MD;  Location: [facility];  Service: Radiology;  Laterality: N/A;   History reviewed. No pertinent family history. Social History  Substance Use Topics  . Smoking status: Former Smoker    Packs/day: 1.00    Types: Cigarettes    Quit date: 05/21/2016  . Smokeless tobacco: Never Used  . Alcohol use No   Current Outpatient Prescriptions  Medication Sig Dispense Refill  . atorvastatin (LIPITOR) 10 MG tablet TAKE 1 TABLET BY MOUTH ONCE DAILY AT 6 PM 30 tablet 0  . citalopram (CELEXA) 10 MG tablet Take 1 tablet (10 mg total) by mouth daily. 30 tablet 1  . clopidogrel (PLAVIX) 75 MG tablet TAKE 1 TABLET BY MOUTH ONCE DAILY WITH BREAKFAST 30 tablet 0  . diclofenac sodium (VOLTAREN) 1 % GEL Apply 2 g topically 4 (four) times daily. 2 g 2  . pantoprazole (PROTONIX) 40 MG tablet Take 1 tablet (40 mg total) by mouth 2 (two) times daily. 60 tablet 1  . ramipril (ALTACE) 5 MG capsule   1   No current facility-administered medications for this visit.    No Known  Allergies   Review of Systems: All systems reviewed and negative except where noted in HPI.    Physical Exam: BP 108/70   Pulse 86   Ht 5\' 6"  (1.676 m)   Wt 170 lb (77.1 kg)   BMI 27.44 kg/m  Constitutional:  Well-developed white male in no acute distress. Psychiatric: Normal mood and affect. Behavior is normal. EENT:  Conjunctivae are normal. No scleral icterus. Neck supple.  Cardiovascular: Normal rate, regular rhythm.  Pulmonary/chest: Effort normal and breath sounds normal. No wheezing, rales or rhonchi. Abdominal: Soft, nondistended, nontender. Bowel sounds active throughout. There are no masses palpable. No hepatomegaly. Extremities: no edema Lymphadenopathy: No cervical adenopathy noted. Neurological: Alert and oriented to person place and time. Skin: Skin is warm and dry. No rashes noted.   ASSESSMENT AND PLAN:  1. 66 yo male with GERD, pyrosis uncontrolled since changing from prilosec to protonix after starting plavix .   -GERD literature given -continue bid protonix -trial of carafate ac. -follow up with me in 3-4 weeks.   2. Colon cancer screening, never had one.   -Hold off for now because he may need EGD as well if GERD symptoms don't improve. It would be best to do both procedures at once since patient would need to be off plavix. Will discuss at folllow up appt  3. CVA. Right MCA branch  infarct secondary to right ICA occlusion June 2017. Given tPA, underwent mechanical thrombectomy with right ICA angioplasty and stenting. He stopped smoking. On plavix.    Tye Savoy, NP  02/21/2017, 2:34 PM   Agree with Ms. Kenna Seward's assessment and plan. Gatha Mayer, MD, [facility]

## 2017-03-12 ENCOUNTER — Other Ambulatory Visit (HOSPITAL_COMMUNITY): Payer: Self-pay | Admitting: Interventional Radiology

## 2017-03-12 ENCOUNTER — Telehealth (HOSPITAL_COMMUNITY): Payer: Self-pay

## 2017-03-12 NOTE — Telephone Encounter (Signed)
Called to schedule 6 month f/u US carotid, no answer. AW

## 2017-03-14 ENCOUNTER — Ambulatory Visit: Payer: 59 | Admitting: Nurse Practitioner

## 2017-03-18 MED FILL — PANTOPRAZOLE SOD DR 40 MG T: 40 | 30 days supply | Qty: 60 | Fill #1

## 2017-04-11 MED FILL — RAMIPRIL 5 MG CAPSULE: 5 | 90 days supply | Qty: 90 | Fill #1

## 2017-04-11 MED FILL — CLOPIDOGREL 75 MG TABLET: 75 | 90 days supply | Qty: 90 | Fill #1

## 2017-04-11 MED FILL — PANTOPRAZOLE SOD DR 40 MG T: 40 | 90 days supply | Qty: 90 | Fill #0

## 2017-04-11 MED FILL — CITALOPRAM HBR 10 MG TABLET: 10 | 90 days supply | Qty: 90 | Fill #0

## 2017-05-03 MED FILL — ATORVASTATIN 10 MG TABLET: 10 | 90 days supply | Qty: 90 | Fill #1

## 2017-05-15 ENCOUNTER — Other Ambulatory Visit (HOSPITAL_COMMUNITY): Payer: Self-pay | Admitting: Interventional Radiology

## 2017-05-15 ENCOUNTER — Telehealth (HOSPITAL_COMMUNITY): Payer: Self-pay

## 2017-05-15 NOTE — Telephone Encounter (Signed)
Called to schedule f/u us carotid, left message for pt to return call. AW 

## 2017-06-11 ENCOUNTER — Telehealth (HOSPITAL_COMMUNITY): Payer: Self-pay

## 2017-06-11 ENCOUNTER — Other Ambulatory Visit (HOSPITAL_COMMUNITY): Payer: Self-pay | Admitting: Interventional Radiology

## 2017-06-11 NOTE — Telephone Encounter (Signed)
Called to schedule f/u us carotid, no answer. AW 

## 2017-07-11 ENCOUNTER — Telehealth: Payer: Self-pay | Admitting: *Deleted

## 2017-07-11 NOTE — Telephone Encounter (Signed)
Attempted to call patient for 12 month follow up for STROKE - AF. No answer or voice mail to leave message. Will try again.

## 2017-07-12 ENCOUNTER — Telehealth: Payer: Self-pay | Admitting: *Deleted

## 2017-07-12 MED FILL — CITALOPRAM HBR 10 MG TABLET: 10 | 90 days supply | Qty: 90 | Fill #1

## 2017-07-12 MED FILL — PANTOPRAZOLE SOD DR 40 MG T: 40 | 90 days supply | Qty: 90 | Fill #1

## 2017-07-12 NOTE — Telephone Encounter (Signed)
Left message for patient to call back, for follow up in STROKE-AF study.

## 2017-07-19 MED FILL — RAMIPRIL 5 MG CAPSULE: 5 | 90 days supply | Qty: 90 | Fill #0

## 2017-07-19 MED FILL — CLOPIDOGREL 75 MG TABLET: 75 | 90 days supply | Qty: 90 | Fill #0

## 2017-07-26 DIAGNOSIS — Z23 Encounter for immunization: Secondary | ICD-10-CM | POA: Diagnosis not present

## 2017-07-26 DIAGNOSIS — K219 Gastro-esophageal reflux disease without esophagitis: Secondary | ICD-10-CM | POA: Diagnosis not present

## 2017-07-26 DIAGNOSIS — F324 Major depressive disorder, single episode, in partial remission: Secondary | ICD-10-CM | POA: Diagnosis not present

## 2017-07-26 DIAGNOSIS — I10 Essential (primary) hypertension: Secondary | ICD-10-CM | POA: Diagnosis not present

## 2017-07-26 DIAGNOSIS — E7800 Pure hypercholesterolemia, unspecified: Secondary | ICD-10-CM | POA: Diagnosis not present

## 2017-07-26 DIAGNOSIS — I6930 Unspecified sequelae of cerebral infarction: Secondary | ICD-10-CM | POA: Diagnosis not present

## 2017-07-26 DIAGNOSIS — G4700 Insomnia, unspecified: Secondary | ICD-10-CM | POA: Diagnosis not present

## 2017-07-26 DIAGNOSIS — Z1389 Encounter for screening for other disorder: Secondary | ICD-10-CM | POA: Diagnosis not present

## 2017-07-29 ENCOUNTER — Ambulatory Visit: Payer: 59 | Admitting: Neurology

## 2017-08-14 ENCOUNTER — Telehealth: Payer: Self-pay | Admitting: *Deleted

## 2017-08-14 NOTE — Telephone Encounter (Signed)
Left message for patient 's wife to call research office.

## 2017-08-21 ENCOUNTER — Telehealth: Payer: Self-pay | Admitting: *Deleted

## 2017-08-21 NOTE — Telephone Encounter (Addendum)
  Patient signed for this letter 08/23/17

## 2017-09-17 ENCOUNTER — Telehealth: Payer: Self-pay | Admitting: *Deleted

## 2017-09-17 NOTE — Telephone Encounter (Signed)
Left message for patient to call the Research office to complete follow up visit over phone.

## 2017-09-19 ENCOUNTER — Telehealth: Payer: Self-pay | Admitting: *Deleted

## 2017-09-19 NOTE — Telephone Encounter (Signed)
Left message for patient or  to call research office

## 2017-09-20 MED FILL — ATORVASTATIN 10 MG TABLET: 10 | 90 days supply | Qty: 90 | Fill #0

## 2017-10-08 ENCOUNTER — Telehealth: Payer: Self-pay | Admitting: *Deleted

## 2017-10-08 NOTE — Telephone Encounter (Signed)
Left message for patient about New STROKE-AF research consent form. Instructed patient to sign/date and return if he would like to continue participating. All remaining follow up visits can be completed over the phone and he will continue to receive payment. Instructed patient to call office for any questions.

## 2017-10-21 MED FILL — CLOPIDOGREL 75 MG TABLET: 75 | 90 days supply | Qty: 90 | Fill #0

## 2017-10-21 MED FILL — CITALOPRAM HBR 10 MG TABLET: 10 | 90 days supply | Qty: 90 | Fill #0

## 2017-10-21 MED FILL — RAMIPRIL 5 MG CAPSULE: 5 | 90 days supply | Qty: 90 | Fill #0

## 2017-10-21 MED FILL — PANTOPRAZOLE SOD DR 40 MG T: 40 | 90 days supply | Qty: 90 | Fill #0

## 2017-10-30 ENCOUNTER — Encounter: Payer: Self-pay | Admitting: *Deleted

## 2017-10-30 DIAGNOSIS — Z006 Encounter for examination for normal comparison and control in clinical research program: Secondary | ICD-10-CM

## 2017-10-30 NOTE — Progress Notes (Signed)
[place] study month 18 telephone follow up visit completed. Patients wife states patient status has not changed. He has not had any symptoms, changes in medication, Diagnostic testing, Hospitalizations or ED visits. I reminded wife that patient needed to sign new Informed consent and mail it back to the research office with provided stamped envelope. Next research required visit window is 18/May/2019-13/Jul/2019.

## 2017-11-01 ENCOUNTER — Telehealth (HOSPITAL_COMMUNITY): Payer: Self-pay

## 2017-11-01 NOTE — Telephone Encounter (Signed)
Called to schedule f/u us carotid, no answer, no vm. AW 

## 2017-12-23 MED FILL — ATORVASTATIN 10 MG TABLET: 10 | 90 days supply | Qty: 90 | Fill #1

## 2018-01-09 ENCOUNTER — Telehealth: Payer: Self-pay | Admitting: Internal Medicine

## 2018-01-09 NOTE — Telephone Encounter (Signed)
Dr. Ardis Hughs will you accept and Dr. Carlean Purl ok to make change to [place]?  Johnathan [name] saw the patient

## 2018-01-09 NOTE — Telephone Encounter (Signed)
Aok w/ me

## 2018-01-09 NOTE — Telephone Encounter (Signed)
Patient wife states primary care doctor wants patient to see Dr.Jacobs. Pt seen by Nevin Bloodgood 3.22.18 and supervising doc that day was Dr.Gessner. Is it okay for patient to transfer care to Dr.Jacobs?

## 2018-01-10 NOTE — Telephone Encounter (Signed)
Called patients wife and scheduled appt with Dr.Jacobs.

## 2018-01-10 NOTE — Telephone Encounter (Signed)
I'm happy to see him. It has been almost a year since [place] saw him and he never followed up with her in the office as she recommended. Not sure what he needs but please do not book him for any cases until he can see me in the office (ROV next available with me).  Thanks

## 2018-01-10 NOTE — Telephone Encounter (Signed)
See note from Dr. Ardis Hughs below.

## 2018-01-20 MED FILL — CITALOPRAM HBR 10 MG TABLET: 10 | 90 days supply | Qty: 90 | Fill #1

## 2018-01-20 MED FILL — RAMIPRIL 5 MG CAPS: 5 | 90 days supply | Qty: 90 | Fill #1

## 2018-01-20 MED FILL — CLOPIDOGREL 75 MG TABLET: 75 | 90 days supply | Qty: 90 | Fill #1

## 2018-01-20 MED FILL — PANTOPRAZOLE SOD DR 40 MG T: 40 | 90 days supply | Qty: 90 | Fill #1

## 2018-01-24 DIAGNOSIS — Z0000 Encounter for general adult medical examination without abnormal findings: Secondary | ICD-10-CM | POA: Diagnosis not present

## 2018-01-24 DIAGNOSIS — F324 Major depressive disorder, single episode, in partial remission: Secondary | ICD-10-CM | POA: Diagnosis not present

## 2018-01-24 DIAGNOSIS — Z23 Encounter for immunization: Secondary | ICD-10-CM | POA: Diagnosis not present

## 2018-01-24 DIAGNOSIS — Z125 Encounter for screening for malignant neoplasm of prostate: Secondary | ICD-10-CM | POA: Diagnosis not present

## 2018-01-24 DIAGNOSIS — Z1211 Encounter for screening for malignant neoplasm of colon: Secondary | ICD-10-CM | POA: Diagnosis not present

## 2018-01-24 DIAGNOSIS — G4700 Insomnia, unspecified: Secondary | ICD-10-CM | POA: Diagnosis not present

## 2018-01-24 DIAGNOSIS — K219 Gastro-esophageal reflux disease without esophagitis: Secondary | ICD-10-CM | POA: Diagnosis not present

## 2018-01-24 DIAGNOSIS — E7800 Pure hypercholesterolemia, unspecified: Secondary | ICD-10-CM | POA: Diagnosis not present

## 2018-01-24 DIAGNOSIS — R972 Elevated prostate specific antigen [PSA]: Secondary | ICD-10-CM | POA: Diagnosis not present

## 2018-01-24 DIAGNOSIS — I6930 Unspecified sequelae of cerebral infarction: Secondary | ICD-10-CM | POA: Diagnosis not present

## 2018-01-24 DIAGNOSIS — I10 Essential (primary) hypertension: Secondary | ICD-10-CM | POA: Diagnosis not present

## 2018-01-30 ENCOUNTER — Other Ambulatory Visit (HOSPITAL_COMMUNITY): Payer: Self-pay | Admitting: Interventional Radiology

## 2018-01-30 ENCOUNTER — Telehealth (HOSPITAL_COMMUNITY): Payer: Self-pay

## 2018-01-30 NOTE — Telephone Encounter (Signed)
Called to schedule 6 months f/u US carotid. AW

## 2018-03-04 ENCOUNTER — Encounter: Payer: Self-pay | Admitting: Gastroenterology

## 2018-03-04 ENCOUNTER — Telehealth: Payer: Self-pay

## 2018-03-04 ENCOUNTER — Ambulatory Visit (INDEPENDENT_AMBULATORY_CARE_PROVIDER_SITE_OTHER): Payer: Medicare Other | Admitting: Gastroenterology

## 2018-03-04 VITALS — BP 144/82 | HR 74 | Ht 66.0 in | Wt 173.5 lb

## 2018-03-04 DIAGNOSIS — K219 Gastro-esophageal reflux disease without esophagitis: Secondary | ICD-10-CM

## 2018-03-04 DIAGNOSIS — Z1211 Encounter for screening for malignant neoplasm of colon: Secondary | ICD-10-CM

## 2018-03-04 MED ORDER — PEG 3350-KCL-NA BICARB-NACL 420 G PO SOLR: 4000.0000 mL | ORAL | 0 refills | Status: DC

## 2018-03-04 MED ORDER — ESOMEPRAZOLE MAGNESIUM 40 MG PO CPDR: 40.0000 mg | DELAYED_RELEASE_CAPSULE | Freq: Every day | ORAL | 3 refills | Status: DC

## 2018-03-04 MED FILL — ESOMEPRAZOLE MAG DR 40 MG C: 40 | 30 days supply | Qty: 30 | Fill #0

## 2018-03-04 MED FILL — GAVILYTE-N SOLUTION: 420 | 2 days supply | Qty: 4000 | Fill #0

## 2018-03-04 NOTE — Patient Instructions (Addendum)
You will be set up for a colonoscopy for colon cancer screening. Stop protonix Start nexium 40mg  pills, take 20-30 min before your first meal of the day. Plavix 5 days before the colonoscopy; will communicate with your PCP about the safety of this recommendation.  Normal BMI (Body Mass Index- based on height and weight) is between 23 and 30. Your BMI today is Body mass index is 28 kg/m. Marland Kitchen Please consider follow up  regarding your BMI with your Primary Care Provider.

## 2018-03-04 NOTE — Progress Notes (Signed)
HPI: This is a very pleasant 67 year old man whom I am meeting for the first time today.  Chief complaint is chronic GERD, routine risk for colon cancer  He was here in our office 1 year ago and saw [facility].  That visit was for heartburn.,  Burning in his chest and belching a lot.  No dysphasia.  He was given GERD literature, recommended to stay on proton pump inhibitor twice daily.  He was given a trial of Carafate with meals.  He was told to follow-up in 3-4 weeks after that visit.  We have not heard from him since then.  During the visit it was also noted that he had never had colon cancer screening.  nexium and prevacid work well in the past, protonix does not.  He was told he should not take other proton pump inhibitors due to potential interaction with Plavix  Since one year ago, continues to have spells of burning in chest, burning in mouth, tastes acidy.  Will occur throghout the day.  Not usually bad at night.  No dysphagia.  His weight has been stable.  NO overt GI bleeding.  Certain foods make it worse such as spicy foods, coffee will  Takes protonix 40mg  once daily in AM.  Eats about 2-3 hours later.  Has nasal congestion;   He tells me that he had a colonoscopy about 15 years ago at [facility] and that it was normal.  He has not had colon cancer screening since then that he is aware of.   ROS: complete GI ROS as described in HPI, all other review negative.  Constitutional:  No unintentional weight loss   Past Medical History:  Diagnosis Date  . Depression   . GERD (gastroesophageal reflux disease)   . HTN (hypertension)   . Hyperlipidemia   . Insomnia   . Stroke [facility])     Past Surgical History:  Procedure Laterality Date  . IR GENERIC HISTORICAL  09/17/2016   IR RADIOLOGIST EVAL & MGMT 09/17/2016 [facility]-INTERV RAD  . knee arthoscopy    . LEG SURGERY    . RADIOLOGY WITH ANESTHESIA N/A 05/12/2016   Procedure: RADIOLOGY WITH ANESTHESIA;  Surgeon: Luanne Bras, MD;  Location: [facility];  Service: Radiology;  Laterality: N/A;    Current Outpatient Medications  Medication Sig Dispense Refill  . atorvastatin (LIPITOR) 10 MG tablet TAKE 1 TABLET BY MOUTH ONCE DAILY AT 6 PM 30 tablet 0  . citalopram (CELEXA) 10 MG tablet Take 1 tablet (10 mg total) by mouth daily. (Patient taking differently: Take 20 mg by mouth daily. ) 30 tablet 1  . clopidogrel (PLAVIX) 75 MG tablet TAKE 1 TABLET BY MOUTH ONCE DAILY WITH BREAKFAST 30 tablet 0  . diclofenac sodium (VOLTAREN) 1 % GEL Apply 2 g topically 4 (four) times daily. 2 g 2  . pantoprazole (PROTONIX) 40 MG tablet Take 1 tablet (40 mg total) by mouth 2 (two) times daily. Take one tablet twice daily before a meal (Patient taking differently: Take 40 mg by mouth daily. Take one tablet twice daily before a meal) 60 tablet 1  . ramipril (ALTACE) 5 MG capsule   1   No current facility-administered medications for this visit.     Allergies as of 03/04/2018  . (No Known Allergies)    History reviewed. No pertinent family history.  Social History   Socioeconomic History  . Marital status: Married    Spouse name: Not on file  . Number of children: Not  on file  . Years of education: Not on file  . Highest education level: Not on file  Occupational History  . Not on file  Social Needs  . Financial resource strain: Not on file  . Food insecurity:    Worry: Not on file    Inability: Not on file  . Transportation needs:    Medical: Not on file    Non-medical: Not on file  Tobacco Use  . Smoking status: Former Smoker    Packs/day: 1.00    Types: Cigarettes    Last attempt to quit: 05/21/2016    Years since quitting: 1.7  . Smokeless tobacco: Never Used  Substance and Sexual Activity  . Alcohol use: No  . Drug use: No  . Sexual activity: Not on file  Lifestyle  . Physical activity:    Days per week: Not on file    Minutes per session: Not on file  . Stress: Not on file  Relationships  .  Social connections:    Talks on phone: Not on file    Gets together: Not on file    Attends religious service: Not on file    Active member of club or organization: Not on file    Attends meetings of clubs or organizations: Not on file    Relationship status: Not on file  . Intimate partner violence:    Fear of current or ex partner: Not on file    Emotionally abused: Not on file    Physically abused: Not on file    Forced sexual activity: Not on file  Other Topics Concern  . Not on file  Social History Narrative  . Not on file     Physical Exam: BP (!) 144/82   Pulse 74   Ht 5\' 6"  (1.676 m)   Wt 173 lb 8 oz (78.7 kg)   BMI 28.00 kg/m  Constitutional: generally well-appearing Psychiatric: alert and oriented x3 Abdomen: soft, nontender, nondistended, no obvious ascites, no peritoneal signs, normal bowel sounds No peripheral edema noted in lower extremities  Assessment and plan: 67 y.o. male with chronic GERD without alarm symptoms, routine risk for colon cancer  First he is not taking proton pump inhibitor at the correct time in relation to meals.  We know it works better if taken shortly before decent meal, 20-30 minutes.  He is also pretty clear that Nexium and Prilosec have helped him quite a bit better than Protonix.  He was told he should not take these proton pump inhibitors due to potential interaction with Plavix.  The data on Plavix interaction is quite old and has been largely debunked.  I think it is certainly safe if he takes a proton pump inhibitor that has historically worked better for him such as Nexium.  I will write a new prescription.  It is best to be takes it shortly before a meal and we will be clear about that.  He has not had colon cancer screening in about 15 years and I recommended that he go ahead with that now.  We will set up colonoscopy at his soonest convenience.  He is on Plavix for previous stroke and we will make sure via communicating with his  primary care physician that it is safe that he hold it for 5 days.  Please see the "Patient Instructions" section for addition details about the plan.  Owens Loffler, MD [facility] 03/04/2018, 9:28 AM

## 2018-03-04 NOTE — Telephone Encounter (Signed)
03/04/2018   RE: WILLIAN DONSON DOB: [birth year] MRN: [id number]   Dear Moreen Fowler   We have scheduled the above patient for an endoscopic procedure. Our records show that he is on anticoagulation therapy.   Please advise as to how long the patient may come off his therapy of Plavix prior to the procedure, which is scheduled for 04/11/18.  Please fax back/ or route to [facility] at [phone].   Sincerely,    Angie Fava

## 2018-03-05 DIAGNOSIS — R972 Elevated prostate specific antigen [PSA]: Secondary | ICD-10-CM | POA: Diagnosis not present

## 2018-03-13 MED FILL — CITALOPRAM HBR 20 MG TABLET: 20 | 90 days supply | Qty: 90 | Fill #0

## 2018-03-16 MED FILL — ATORVASTATIN 10 MG TABLET: 10 | 90 days supply | Qty: 90 | Fill #0

## 2018-03-25 ENCOUNTER — Encounter: Payer: Self-pay | Admitting: Neurology

## 2018-03-25 ENCOUNTER — Ambulatory Visit (INDEPENDENT_AMBULATORY_CARE_PROVIDER_SITE_OTHER): Payer: Medicare Other | Admitting: Neurology

## 2018-03-25 VITALS — BP 125/73 | HR 72 | Ht 66.0 in | Wt 173.2 lb

## 2018-03-25 DIAGNOSIS — I6990 Unspecified sequelae of unspecified cerebrovascular disease: Secondary | ICD-10-CM

## 2018-03-25 NOTE — Progress Notes (Signed)
[facility] [address] [place]. [place] [address] [phone]       OFFICE FOLLOW-UP NOTE  Johnathan. Johnathan [name] Date of Birth:  1951-02-06 Medical Record Number:  [id number]   HPI: Initial visit 07/30/2016 Johnathan [name] is a 55 year Caucasian male seen today for first offcie f/u visit after [facility] admission for stroke in June 2017.He is accompanied by his wife today.Johnathan [name] is a 67 y.o. male who was in his normal state of health earlier working outside in the lawn. He had a syncopal episode At 1705 on 05/12/2016 ([facility]). EMS was called and arrived on scene for the syncope. After their arrival, at 1728 he developed severe left-sided weakness.They called a code stroke and transported him to the ER where he was found to have a left hemiparesis with neglect. He was given IV tPA and and taken to IR where he had  superselective infusion of 5mg  of tpa and endovascul;ar revascularization of symptomatic occluded RT ICA prox with stent assisted angioplasty ,and infusion of 18 mg of IA Integrelin and 9 mg of Reopro into intrastent thrombosis with gradual clearance of filling defects   He was kept in the intensive care unit and blood pressure was tightly controlled. MRI scan of the brain showed extensive acute and subacute infarcts involving posterior division of the right MCA with slight involvement of the medial aspect of the posterior right frontal lobe and cingulate gyrus. Transthoracic echo showed normal ejection fraction. LDL cholesterol 70 mg percent and hemoglobin A1c was 5.6. Patient was started on dual antiplatelet therapy because of his carotid stent and advised to quit smoking which he has done.Marland Kitchen He was transferred to inpatient rehabilitation for ongoing therapy needs and did well there and has now been discharged home. He is able to walk independently without assistance. He still has mild weakness of his left hand and some residual numbness in his left hand and leg. He states he tires  more easily and stamina is nondistended. He also has some Percocet to deficits in his left hand. He has quit smoking. He is tolerating aspirin and Plavix with minor bruising in his skin but no bleeding episodes. His blood pressure is well controlled though it is borderline today at 142/80. Is tolerating Lipitor without muscle aches or pains. Patient to has finished therapy at home and plans to start outpatient therapy soon. Update 01/28/2017 : He returns for follow-up after last visit 6 months ago. He continues to do well but does have some mild left-sided weakness, heaviness as well as tingling particularly when he started and has worked hard. He still feels his left side is not as strong as the right. Patient continues to tolerate Plavix well without bleeding or bruising. His blood pressure is well controlled today it is 116/78. He is tolerating Lipitor well without muscle aches and pains. The patient has not yet lost any weight in fact may have gained a few pounds. He has not been on a strict diet and not following her regular exercise regimen as well. He has no new complaints today. Update 03/25/2018 :he returns for follow-up after last one year ago.he continues to do well without recft hand numbness pa long time. This improves after he rests. He is tolerating Plavix well wit He remains on Lipitor which is tolerating wel lwithout any muscle aches or pains.. He quit smoking since this stroke today it is 125/73 He has no new complaints. He has  not had any carotid ultrasound checked in  more than a year. ROS:   14 system review of systems is positive for   Easy bruising and bleeding, restless only and all other systems negative PMH:  Past Medical History:  Diagnosis Date  . Depression   . GERD (gastroesophageal reflux disease)   . HTN (hypertension)   . Hyperlipidemia   . Insomnia   . Stroke [facility])     Social History:  Social History   Socioeconomic History  . Marital status: Married    Spouse  name: Not on file  . Number of children: Not on file  . Years of education: Not on file  . Highest education level: Not on file  Occupational History  . Not on file  Social Needs  . Financial resource strain: Not on file  . Food insecurity:    Worry: Not on file    Inability: Not on file  . Transportation needs:    Medical: Not on file    Non-medical: Not on file  Tobacco Use  . Smoking status: Former Smoker    Packs/day: 1.00    Types: Cigarettes    Last attempt to quit: 05/21/2016    Years since quitting: 1.8  . Smokeless tobacco: Never Used  Substance and Sexual Activity  . Alcohol use: No  . Drug use: No  . Sexual activity: Not on file  Lifestyle  . Physical activity:    Days per week: Not on file    Minutes per session: Not on file  . Stress: Not on file  Relationships  . Social connections:    Talks on phone: Not on file    Gets together: Not on file    Attends religious service: Not on file    Active member of club or organization: Not on file    Attends meetings of clubs or organizations: Not on file    Relationship status: Not on file  . Intimate partner violence:    Fear of current or ex partner: Not on file    Emotionally abused: Not on file    Physically abused: Not on file    Forced sexual activity: Not on file  Other Topics Concern  . Not on file  Social History Narrative  . Not on file    Medications:   Current Outpatient Medications on File Prior to Visit  Medication Sig Dispense Refill  . atorvastatin (LIPITOR) 10 MG tablet TAKE 1 TABLET BY MOUTH ONCE DAILY AT 6 PM 30 tablet 0  . citalopram (CELEXA) 10 MG tablet Take 1 tablet (10 mg total) by mouth daily. (Patient taking differently: Take 20 mg by mouth daily. ) 30 tablet 1  . clopidogrel (PLAVIX) 75 MG tablet TAKE 1 TABLET BY MOUTH ONCE DAILY WITH BREAKFAST 30 tablet 0  . diclofenac sodium (VOLTAREN) 1 % GEL Apply 2 g topically 4 (four) times daily. 2 g 2  . esomeprazole (NEXIUM) 40 MG capsule  Take 1 capsule (40 mg total) by mouth daily at 12 noon. 30 capsule 3  . pantoprazole (PROTONIX) 40 MG tablet Take 1 tablet (40 mg total) by mouth 2 (two) times daily. Take one tablet twice daily before a meal (Patient taking differently: Take 40 mg by mouth daily. Take one tablet twice daily before a meal) 60 tablet 1  . polyethylene glycol-electrolytes (NULYTELY/GOLYTELY) 420 g solution Take 4,000 mLs by mouth as directed. 4000 mL 0  . ramipril (ALTACE) 5 MG capsule   1   No current facility-administered medications on file prior to  visit.     Allergies:  No Known Allergies  Physical Exam General: well developed, well nourished middle-age Caucasian male, seated, in no evident distress Head: head normocephalic and atraumatic.  Neck: supple with no carotid or supraclavicular bruits Cardiovascular: regular rate and rhythm, no murmurs Musculoskeletal: no deformity Skin:  no rash/petichiae Vascular:  Normal pulses all extremities Vitals:   03/25/18 1557  BP: 125/73  Pulse: 72   Neurologic Exam Mental Status: Awake and fully alert. Oriented to place and time. Recent and remote memory intact. Attention span, concentration and fund of knowledge appropriate. Mood and affect appropriate.  Cranial Nerves: Fundoscopic exam not done Pupils equal reactive to light. Extraocular movements full without nystagmus. Visual fields full to confrontation. Hearing intact. Facial sensation intact. Face, tongue, palate moves normally and symmetrically.  Motor: Normal bulk and tone. Normal strength in all tested extremity muscles.Mild weakness left grip and intrinsic hand muscles. Orbits right over left upper extremity. Fine finger movements are diminished on the left.mildly weakness of left ankle dorsiflexors  Sensory.: intact to touch ,pinprick .position and vibratory sensation Except for slight diminished sensation in the left hand and left leg..  Coordination: Rapid alternating movements normal in all  extremities. Finger-to-nose and heel-to-shin performed accurately bilaterally. Gait and Station: Arises from chair without difficulty. Stance is normal. Gait demonstrates normal stride length and balance . Able to heel, toe and tandem walk without difficulty.  Reflexes: 1+ and symmetric. Toes downgoing.   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 Multiple vascular risk factors of hypertension, hyperlipidemia, smoking, obesity. Patient is doing extremely well with significant functional improvement    PLAN: I had a long d/w patient about his remote stroke,right carotid occlusion, risk for recurrent stroke/TIAs, personally independently reviewed imaging studies and stroke evaluation results and answered questions.Continue Plavix for secondary stroke prevention and maintain strict control of hypertension with blood pressure goal below 130/90, diabetes with hemoglobin A1c goal below 6.5% and lipids with LDL cholesterol goal below 70 mg/dL. I also advised the patient to eat a healthy diet with plenty of whole grains, cereals, fruits and vegetables, exercise regularly and maintain ideal body weight .  Follow-up carotid and transcranial doppler studies..Followup in the future with my nurse practitionerJessica in one year or call earlier if necessary.Greater than 50% of time during this 25 minute visit was spent on counseling about stroke recurrence risk, need for risk factor modification and particularly diet, exercise and weight loss and answering questions  Antony Contras, MD  [facility] [address] [place], [place] [address]  Phone [phone] Fax [phone] Note: This document was prepared with digital dictation and possible smart phrase  technology. Any transcriptional errors that result from this process are unintentional

## 2018-03-25 NOTE — Patient Instructions (Addendum)
I had a long d/w patient about his remote stroke,right carotid occlusion, risk for recurrent stroke/TIAs, personally independently reviewed imaging studies and stroke evaluation results and answered questions.Continue Plavix for secondary stroke prevention and maintain strict control of hypertension with blood pressure goal below 130/90, diabetes with hemoglobin A1c goal below 6.5% and lipids with LDL cholesterol goal below 70 mg/dL. I also advised the patient to eat a healthy diet with plenty of whole grains, cereals, fruits and vegetables, exercise regularly and maintain ideal body weight .  Follow-up carotid and transcranial doppler studies..Followup in the future with my nurse practitionerJessica in one year or call earlier if necessary.

## 2018-03-25 NOTE — Telephone Encounter (Signed)
I have tried to contact patient several times and have left voice mail messages to inform him to hold his Plavix 5 days prior to his endoscopic procedure on 04/11/18. Per Dr Moreen Fowler. I have mailed him the information, and have asked him to call our office for any questions he may have.

## 2018-03-28 ENCOUNTER — Encounter: Payer: Self-pay | Admitting: Gastroenterology

## 2018-04-02 ENCOUNTER — Ambulatory Visit (HOSPITAL_COMMUNITY): Payer: No Typology Code available for payment source

## 2018-04-02 MED FILL — ESOMEPRAZOLE MAG DR 40 MG C: 40 | 30 days supply | Qty: 30 | Fill #1

## 2018-04-11 ENCOUNTER — Encounter: Payer: Medicare Other | Admitting: Gastroenterology

## 2018-04-22 ENCOUNTER — Encounter: Payer: Self-pay | Admitting: *Deleted

## 2018-04-22 DIAGNOSIS — Z006 Encounter for examination for normal comparison and control in clinical research program: Secondary | ICD-10-CM

## 2018-04-22 MED FILL — CLOPIDOGREL 75 MG TABLET: 75 | 90 days supply | Qty: 90 | Fill #0

## 2018-04-22 MED FILL — RAMIPRIL 5 MG CAPS: 5 | 90 days supply | Qty: 90 | Fill #0

## 2018-04-22 NOTE — Progress Notes (Signed)
STROKE~AF research study  month 24 follow up visit completed. Patient recently had follow up visit with Dr. Leonie Man and has had no changes since that visit. No changes in medication. NIHSS 1 Modified rankin2 Next research required visit is due 19/Nov/2019-14/Jan/2020

## 2018-04-30 ENCOUNTER — Ambulatory Visit (HOSPITAL_BASED_OUTPATIENT_CLINIC_OR_DEPARTMENT_OTHER)
Admission: RE | Admit: 2018-04-30 | Discharge: 2018-04-30 | Disposition: A | Discharge status: Home / Self Care | Payer: Medicare Other | Source: Ambulatory Visit | Attending: Neurology | Admitting: Neurology

## 2018-04-30 ENCOUNTER — Ambulatory Visit (HOSPITAL_COMMUNITY)
Admission: RE | Admit: 2018-04-30 | Discharge: 2018-04-30 | Disposition: A | Discharge status: Home / Self Care | Payer: Medicare Other | Source: Ambulatory Visit | Attending: Neurology | Admitting: Neurology

## 2018-04-30 DIAGNOSIS — I6990 Unspecified sequelae of unspecified cerebrovascular disease: Secondary | ICD-10-CM

## 2018-04-30 DIAGNOSIS — I6521 Occlusion and stenosis of right carotid artery: Secondary | ICD-10-CM | POA: Insufficient documentation

## 2018-05-07 ENCOUNTER — Telehealth: Payer: Self-pay

## 2018-05-07 NOTE — Telephone Encounter (Signed)
Notes recorded by Marval Regal, RN on 05/07/2018 at 5:43 PM EDT Rn spoke with wife about carotid ultrasound study. Rn stated per Dr.Sethi the ultrasound study shows no significant restenosis in the right carotid stent which appears to be open. The left carotid artery appears to show 60 to 79% narrowing which appears to have progressed compared with previous study from 2017. Since patient has had no new stroke symptoms recommend repeat carotid ultrasound in 6 months or earlier if new symptoms. No need to worry about carotid surgery or stent on the left at the present time.The wife verbalized understanding, and will call back. The wife will call back in 6 months to get carotid ultrasound after thanksgiving to request test to be order per Dr. Leonie Man recommendations or earlier is pt is having some neurological issues. ------

## 2018-05-07 NOTE — Telephone Encounter (Signed)
-----   Message from Garvin Fila, MD sent at 05/06/2018 10:31 AM EDT ----- Johnathan [name] inform the patient that carotid ultrasound study shows no significant restenosis in the right carotid stent which appears to be open.  The left carotid artery appears to show 60 to 79% narrowing which appears to have progressed compared with previous study from 2017.  Since patient has had no new stroke symptoms recommend repeat carotid ultrasound in 6 months or earlier if new symptoms.  No need to worry about carotid surgery or stent on the left at the present time

## 2018-05-07 NOTE — Telephone Encounter (Signed)
Notes recorded by Marval Regal, RN on 05/07/2018 at 5:44 PM EDT RN call patients wife on dpr that transcranial Doppler study was normal.The wife verbalized understanding. ------

## 2018-05-14 ENCOUNTER — Telehealth: Payer: Self-pay | Admitting: Gastroenterology

## 2018-05-14 NOTE — Telephone Encounter (Signed)
Ok, thanks.

## 2018-05-14 NOTE — Telephone Encounter (Signed)
Noted apt change 

## 2018-05-16 ENCOUNTER — Encounter: Payer: Medicare Other | Admitting: Gastroenterology

## 2018-06-04 MED FILL — ESOMEPRAZOLE MAG DR 40 MG C: 40 | 30 days supply | Qty: 30 | Fill #2

## 2018-06-04 MED FILL — CITALOPRAM HBR 20 MG TABLET: 20 | 90 days supply | Qty: 90 | Fill #1

## 2018-07-01 MED FILL — ESOMEPRAZOLE MAG DR 40 MG C: 40 | 30 days supply | Qty: 30 | Fill #3

## 2018-07-01 MED FILL — ATORVASTATIN 10 MG TABLET: 10 | 90 days supply | Qty: 90 | Fill #1

## 2018-07-23 MED FILL — RAMIPRIL 5 MG CAPS: 5 | 90 days supply | Qty: 90 | Fill #1

## 2018-07-23 MED FILL — CLOPIDOGREL 75 MG TABLET: 75 | 90 days supply | Qty: 90 | Fill #1

## 2018-07-24 DIAGNOSIS — I10 Essential (primary) hypertension: Secondary | ICD-10-CM | POA: Diagnosis not present

## 2018-07-24 DIAGNOSIS — E7800 Pure hypercholesterolemia, unspecified: Secondary | ICD-10-CM | POA: Diagnosis not present

## 2018-07-24 DIAGNOSIS — Z1389 Encounter for screening for other disorder: Secondary | ICD-10-CM | POA: Diagnosis not present

## 2018-07-24 DIAGNOSIS — R0681 Apnea, not elsewhere classified: Secondary | ICD-10-CM | POA: Diagnosis not present

## 2018-07-24 DIAGNOSIS — I6930 Unspecified sequelae of cerebral infarction: Secondary | ICD-10-CM | POA: Diagnosis not present

## 2018-07-24 DIAGNOSIS — F324 Major depressive disorder, single episode, in partial remission: Secondary | ICD-10-CM | POA: Diagnosis not present

## 2018-07-24 DIAGNOSIS — K219 Gastro-esophageal reflux disease without esophagitis: Secondary | ICD-10-CM | POA: Diagnosis not present

## 2018-07-24 DIAGNOSIS — G4700 Insomnia, unspecified: Secondary | ICD-10-CM | POA: Diagnosis not present

## 2018-07-30 ENCOUNTER — Ambulatory Visit (AMBULATORY_SURGERY_CENTER): Payer: No Typology Code available for payment source | Admitting: Gastroenterology

## 2018-07-30 ENCOUNTER — Encounter: Payer: Self-pay | Admitting: Gastroenterology

## 2018-07-30 VITALS — BP 133/85 | HR 62 | Temp 99.1°F | Resp 17 | Ht 66.0 in | Wt 173.0 lb

## 2018-07-30 DIAGNOSIS — Z1211 Encounter for screening for malignant neoplasm of colon: Secondary | ICD-10-CM

## 2018-07-30 DIAGNOSIS — D122 Benign neoplasm of ascending colon: Secondary | ICD-10-CM | POA: Diagnosis not present

## 2018-07-30 DIAGNOSIS — K635 Polyp of colon: Secondary | ICD-10-CM

## 2018-07-30 DIAGNOSIS — D125 Benign neoplasm of sigmoid colon: Secondary | ICD-10-CM | POA: Diagnosis not present

## 2018-07-30 MED ORDER — SODIUM CHLORIDE 0.9 % IV SOLN: 500.0000 mL | Freq: Once | INTRAVENOUS | Status: AC

## 2018-07-30 NOTE — Patient Instructions (Signed)
YOU HAD AN ENDOSCOPIC PROCEDURE TODAY AT [facility]:   Refer to the procedure report that was given to you for any specific questions about what was found during the examination.  If the procedure report does not answer your questions, please call your gastroenterologist to clarify.  If you requested that your care partner not be given the details of your procedure findings, then the procedure report has been included in a sealed envelope for you to review at your convenience later.  YOU SHOULD EXPECT: Some feelings of bloating in the abdomen. Passage of more gas than usual.  Walking can help get rid of the air that was put into your GI tract during the procedure and reduce the bloating. If you had a lower endoscopy (such as a colonoscopy or flexible sigmoidoscopy) you may notice spotting of blood in your stool or on the toilet paper. If you underwent a bowel prep for your procedure, you may not have a normal bowel movement for a few days.  Please Note:  You might notice some irritation and congestion in your nose or some drainage.  This is from the oxygen used during your procedure.  There is no need for concern and it should clear up in a day or so.  SYMPTOMS TO REPORT IMMEDIATELY:   Following lower endoscopy (colonoscopy or flexible sigmoidoscopy):  Excessive amounts of blood in the stool  Significant tenderness or worsening of abdominal pains  Swelling of the abdomen that is new, acute  Fever of 100F or higher  Please see handouts given to you on Polyps.  You may restart your Plavix today.  For urgent or emergent issues, a gastroenterologist can be reached at any hour by calling [phone].   DIET:  We do recommend a small meal at first, but then you may proceed to your regular diet.  Drink plenty of fluids but you should avoid alcoholic beverages for 24 hours.  ACTIVITY:  You should plan to take it easy for the rest of today and you should NOT DRIVE or use heavy  machinery until tomorrow (because of the sedation medicines used during the test).    FOLLOW UP: Our staff will call the number listed on your records the next business day following your procedure to check on you and address any questions or concerns that you may have regarding the information given to you following your procedure. If we do not reach you, we will leave a message.  However, if you are feeling well and you are not experiencing any problems, there is no need to return our call.  We will assume that you have returned to your regular daily activities without incident.  If any biopsies were taken you will be contacted by phone or by letter within the next 1-3 weeks.  Please call us at [phone] if you have not heard about the biopsies in 3 weeks.    SIGNATURES/CONFIDENTIALITY: You and/or your care partner have signed paperwork which will be entered into your electronic medical record.  These signatures attest to the fact that that the information above on your After Visit Summary has been reviewed and is understood.  Full responsibility of the confidentiality of this discharge information lies with you and/or your care-partner.  Thank you for letting us take care of your healthcare needs today.

## 2018-07-30 NOTE — Progress Notes (Signed)
Called to room to assist during endoscopic procedure.  Patient ID and intended procedure confirmed with present staff. Received instructions for my participation in the procedure from the performing physician.  

## 2018-07-30 NOTE — Progress Notes (Signed)
Pt's states no medical or surgical changes since previsit or office visit. 

## 2018-07-30 NOTE — Op Note (Signed)
[facility] Patient Name: Johnathan [name] Procedure Date: 07/30/2018 4:19 PM MRN: [id number] Endoscopist: Milus Banister , MD Age: 67 Referring MD:  Date of Birth: [birth year] Gender: Male Account #: [id number] Procedure:                Colonoscopy Indications:              Screening for colorectal malignant neoplasm Medicines:                Monitored Anesthesia Care Procedure:                Pre-Anesthesia Assessment:                           - Prior to the procedure, a History and Physical                            was performed, and patient medications and                            allergies were reviewed. The patient's tolerance of                            previous anesthesia was also reviewed. The risks                            and benefits of the procedure and the sedation                            options and risks were discussed with the patient.                            All questions were answered, and informed consent                            was obtained. Prior Anticoagulants: The patient has                            taken Plavix (clopidogrel), last dose was 5 days                            prior to procedure. ASA Grade Assessment: II - A                            patient with mild systemic disease. After reviewing                            the risks and benefits, the patient was deemed in                            satisfactory condition to undergo the procedure.                           After obtaining informed consent, the colonoscope  was passed under direct vision. Throughout the                            procedure, the patient's blood pressure, pulse, and                            oxygen saturations were monitored continuously. The                            Colonoscope was introduced through the anus and                            advanced to the the cecum, identified by                            appendiceal  orifice and ileocecal valve. The                            colonoscopy was performed without difficulty. The                            patient tolerated the procedure well. The quality                            of the bowel preparation was good. The ileocecal                            valve, appendiceal orifice, and rectum were                            photographed. Scope In: 4:45:15 PM Scope Out: 5:01:32 PM Scope Withdrawal Time: 0 hours 8 minutes 3 seconds  Total Procedure Duration: 0 hours 16 minutes 17 seconds  Findings:                 Three sessile polyps were found in the sigmoid                            colon and ascending colon. The polyps were 2 to 3                            mm in size. These polyps were removed with a cold                            snare. Resection and retrieval were complete.                           The exam was otherwise without abnormality on                            direct and retroflexion views. Complications:            No immediate complications. Estimated blood loss:  None. Estimated Blood Loss:     Estimated blood loss: none. Impression:               - Three 2 to 3 mm polyps in the sigmoid colon and                            in the ascending colon, removed with a cold snare.                            Resected and retrieved.                           - The examination was otherwise normal on direct                            and retroflexion views. Recommendation:           - Patient has a contact number available for                            emergencies. The signs and symptoms of potential                            delayed complications were discussed with the                            patient. Return to normal activities tomorrow.                            Written discharge instructions were provided to the                            patient.                           - Resume previous diet.                            - Continue present medications.                           You will receive a letter within 2-3 weeks with the                            pathology results and my final recommendations.                           If the polyp(s) is proven to be 'pre-cancerous' on                            pathology, you will need repeat colonoscopy in 3-5                            years. If the polyp(s) is NOT 'precancerous' on  pathology then you should repeat colon cancer                            screening in 10 years with colonoscopy without need                            for colon cancer screening by any method prior to                            then (including stool testing). Milus Banister, MD 07/30/2018 5:04:11 PM This report has been signed electronically.

## 2018-07-30 NOTE — Progress Notes (Signed)
To PACU, VSS. Report to [email or web address].tb 

## 2018-07-31 ENCOUNTER — Telehealth: Payer: Self-pay | Admitting: *Deleted

## 2018-07-31 NOTE — Telephone Encounter (Signed)
  Follow up Call-  Call back number 07/30/2018  Post procedure Call Back phone  # [phone]  Permission to leave phone message Yes  Some recent data might be hidden    Spoke with wife Patient questions:  Do you have a fever, pain , or abdominal swelling? No. Pain Score  0 *  Have you tolerated food without any problems? Yes.    Have you been able to return to your normal activities? Yes.    Do you have any questions about your discharge instructions: Diet   No. Medications  No. Follow up visit  No.  Do you have questions or concerns about your Care? No.  Actions: * If pain score is 4 or above: No action needed, pain <4.

## 2018-08-06 ENCOUNTER — Encounter: Payer: Self-pay | Admitting: Gastroenterology

## 2018-09-12 ENCOUNTER — Other Ambulatory Visit: Payer: Self-pay | Admitting: Gastroenterology

## 2018-09-12 MED FILL — PANTOPRAZOLE SOD DR 40 MG T: 40 | 90 days supply | Qty: 90 | Fill #0

## 2018-09-12 MED FILL — CITALOPRAM HBR 20 MG TABLET: 20 | 90 days supply | Qty: 90 | Fill #0

## 2018-09-15 ENCOUNTER — Telehealth: Payer: Self-pay | Admitting: Gastroenterology

## 2018-09-15 NOTE — Telephone Encounter (Signed)
Patient is no longer taking Nexium

## 2018-09-15 NOTE — Telephone Encounter (Signed)
[facility] pharmacy needs clarification on Nexium. Pls call them.

## 2018-10-03 MED FILL — ATORVASTATIN 10 MG TABLET: 10 | 90 days supply | Qty: 90 | Fill #0

## 2018-10-23 MED FILL — RAMIPRIL 5 MG CAPS: 5 | 90 days supply | Qty: 90 | Fill #0

## 2018-10-23 MED FILL — CLOPIDOGREL 75 MG TABLET: 75 | 90 days supply | Qty: 90 | Fill #0

## 2018-10-29 ENCOUNTER — Encounter: Payer: Self-pay | Admitting: *Deleted

## 2018-10-29 DIAGNOSIS — Z006 Encounter for examination for normal comparison and control in clinical research program: Secondary | ICD-10-CM

## 2018-10-29 NOTE — Research (Addendum)
[facility] Research documentation. Follow up has continued via phone or email. I have not received new updated signed ICF for the study from patient. I have emailed wife and mailed another ICF for signature. Patient has 2 remaining follow up phone visits.

## 2018-12-12 MED FILL — PANTOPRAZOLE SOD DR 40 MG T: 40 | 90 days supply | Qty: 90 | Fill #1

## 2018-12-12 MED FILL — CITALOPRAM HBR 20 MG TABLET: 20 | 90 days supply | Qty: 90 | Fill #1

## 2018-12-22 ENCOUNTER — Telehealth: Payer: Self-pay | Admitting: *Deleted

## 2018-12-22 NOTE — Telephone Encounter (Signed)
STROKE~AF month 30 follow up visit completed with patients wife via phone/email. See adverse events or changes in cardiovascular medications.

## 2019-01-13 MED FILL — ATORVASTATIN 10 MG TABLET: 10 | 90 days supply | Qty: 90 | Fill #1

## 2019-01-15 MED FILL — CLOPIDOGREL 75 MG TABLET: 75 | 90 days supply | Qty: 90 | Fill #1

## 2019-01-15 MED FILL — RAMIPRIL 5 MG CAPS: 5 | 90 days supply | Qty: 90 | Fill #1

## 2019-03-14 MED FILL — CITALOPRAM HBR 20 MG TABLET: 20 | 90 days supply | Qty: 90 | Fill #0

## 2019-03-14 MED FILL — PANTOPRAZOLE SOD DR 40 MG T: 40 | 90 days supply | Qty: 90 | Fill #0

## 2019-04-06 ENCOUNTER — Telehealth: Payer: Self-pay

## 2019-04-06 NOTE — Telephone Encounter (Signed)
If pts wife call back please get verbal consent to do video visit and to file insurance. Also if she has a cell phone that has a camera on it. We need to verify her cell number. VM left that we are only doing video visit due to COVID 19. We are not seeing pts in the office at this time.

## 2019-04-07 NOTE — Telephone Encounter (Signed)
pts wife called in and stated she can help with video visit and her phone may be used but it will not be tomorrow , she will be able 04/13/2019  [place]

## 2019-04-07 NOTE — Telephone Encounter (Signed)
I called pts wife Katharine Look that we change r/s him in a week with Dr.Sethi. Katharine Look stated she will not be at home for the visit.She has a smart phone with a camera. She gave verbal consent to do video and to file insurance. I explain that Dr. Leonie Man will send a text to her cell phone day of the visit. She will click the link above 5-46 minutes before your appointment time. You will be asked to enter pts  name (First and Last) and then it will put you into Dr.Sethi  "waiting room." he  should be with you at your scheduled appointment time, but please be patient because hemay be running a few minutes.

## 2019-04-07 NOTE — Telephone Encounter (Signed)
The wife verbalized understands.

## 2019-04-08 ENCOUNTER — Ambulatory Visit: Payer: Medicare Other | Admitting: Neurology

## 2019-04-08 MED FILL — ATORVASTATIN 10 MG TABLET: 10 | 90 days supply | Qty: 90 | Fill #0

## 2019-04-15 MED FILL — CLOPIDOGREL 75 MG TABLET: 75 | 90 days supply | Qty: 90 | Fill #0

## 2019-04-15 MED FILL — RAMIPRIL 5 MG CAPS: 5 | 90 days supply | Qty: 90 | Fill #0

## 2019-04-20 ENCOUNTER — Ambulatory Visit (INDEPENDENT_AMBULATORY_CARE_PROVIDER_SITE_OTHER): Payer: 59 | Admitting: Neurology

## 2019-04-20 ENCOUNTER — Other Ambulatory Visit: Payer: Self-pay

## 2019-04-20 ENCOUNTER — Encounter: Payer: Self-pay | Admitting: Neurology

## 2019-04-20 DIAGNOSIS — I6990 Unspecified sequelae of unspecified cerebrovascular disease: Secondary | ICD-10-CM | POA: Diagnosis not present

## 2019-04-20 DIAGNOSIS — I6529 Occlusion and stenosis of unspecified carotid artery: Secondary | ICD-10-CM

## 2019-04-20 NOTE — Progress Notes (Signed)
Virtual Visit via Video Note  I connected with Elease Hashimoto on 04/20/19 at  9:30 AM EDT by a video enabled telemedicine application and verified that I am speaking with the correct person using two identifiers. This visit was performed using [email or web address] app for audio and visual.  The patient was accompanied by his wife who facilitated this visit. Location: Patient: At his home  Provider: At [facility] office   I discussed the limitations of evaluation and management by telemedicine and the availability of in person appointments. The patient expressed understanding and agreed to proceed.  History of Present Illness: Mr. Pulliam is seen for video follow-up visit after last visit a year ago.  He continues to do well from stroke standpoint without any recurrent stroke or TIA symptoms.  He states he has not been feeling well with low energy and tiredness last Christmas.  He has seen his primary care physician who initially thought this may have been related to his cholesterol medicine and stopped it for a while but symptoms did not improve and he is back on Lipitor now.  He had some lab work done as well which is not available for my review but apparently no obvious cause was found.  Patient is tolerating Plavix well without bruising or bleeding.  He states his last lipid profile checked by primary physician was satisfactory.  His blood pressure is well controlled.  He states he does eat healthy but has not lost any weight.  He has not been exercising regularly due to the coronavirus pandemic.  He has not had any follow-up carotid Doppler studies done for more than a year.  He has no new neurological complaints today.   Observations/Objective: Physical and neurological exam are limited due to constraints of video visit.  Pleasant middle-aged obese Caucasian male who appears not to be in distress.  He is awake alert oriented to time place and person.  Speech and language appear normal.  Extraocular movements are full  range without nystagmus.  Face is symmetric without weakness.  Tongue is midline.  Motor system exam reveals no upper extremity drift.  Fine finger movements are slightly diminished on the left.  He orbits right over left upper extremity.  He is able to stand well and walks with a steady gait.  He can stand on his toes, heels and either foot unsupported. 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  Multiple vascular risk factors of hypertension, hyperlipidemia, smoking, obesity, carotid disease.  He continues to do well with functional improvement.  Plan: I had a long discussion with the patient and his wife regarding his remote stroke and his multiple vascular risk factors discussed importance of aggressive risk factor modification and compliance with medications and also continue Plavix for stroke prevention with strict control of hypertension with blood pressure goal below 130/90, lipids with LDL cholesterol goal below 70 mg percent and diabetes with hemoglobin A1c goal below 6.5% increase.  Healthy diet with lots of fruits, vegetables, whole grains and not active and to lose weight.  We will check follow-up carotid ultrasound and transcranial Doppler studies to follow this stent for restenosis.  He was advised to follow-up with his primary care doctor for evaluation and treatment for his symptoms of generalized fatigue and tiredness Follow Up Instructions: Follow-up in 1 year or call earlier if necessary.   I  discussed the assessment and treatment plan with the patient. The patient was provided an opportunity to ask questions and all were answered. The patient agreed with the plan and demonstrated an understanding of the instructions.   The patient was advised to call back or seek an in-person evaluation if the symptoms worsen or if the  condition fails to improve as anticipated.  I provided 25 minutes of non-face-to-face time during this encounter.   Antony Contras, MD

## 2019-05-05 ENCOUNTER — Ambulatory Visit (HOSPITAL_COMMUNITY)
Admission: RE | Admit: 2019-05-05 | Discharge: 2019-05-05 | Disposition: A | Discharge status: Home / Self Care | Payer: Medicare Other | Source: Ambulatory Visit | Attending: Neurology | Admitting: Neurology

## 2019-05-05 ENCOUNTER — Encounter (HOSPITAL_COMMUNITY): Payer: No Typology Code available for payment source

## 2019-05-05 ENCOUNTER — Other Ambulatory Visit: Payer: Self-pay

## 2019-05-05 ENCOUNTER — Other Ambulatory Visit (HOSPITAL_COMMUNITY): Payer: No Typology Code available for payment source

## 2019-05-05 DIAGNOSIS — I6529 Occlusion and stenosis of unspecified carotid artery: Secondary | ICD-10-CM

## 2019-05-05 NOTE — Progress Notes (Addendum)
Patient arrived today for his TCD and carotid study and had a temperature of 100.2 to 100.9. The patient was instructed to go home, call his PCP about his elevated temperature and quarantine. Both studies were cancelled. Oda Cogan, BS, RDMS, RVT

## 2019-06-08 MED FILL — CITALOPRAM HBR 20 MG TABLET: 20 | 90 days supply | Qty: 90 | Fill #0

## 2019-06-09 MED FILL — PANTOPRAZOLE SOD DR 40 MG T: 40 | 30 days supply | Qty: 30 | Fill #0

## 2019-06-11 ENCOUNTER — Other Ambulatory Visit (HOSPITAL_COMMUNITY): Payer: Self-pay | Admitting: Neurology

## 2019-06-11 DIAGNOSIS — G4700 Insomnia, unspecified: Secondary | ICD-10-CM | POA: Diagnosis not present

## 2019-06-11 DIAGNOSIS — K219 Gastro-esophageal reflux disease without esophagitis: Secondary | ICD-10-CM | POA: Diagnosis not present

## 2019-06-11 DIAGNOSIS — F324 Major depressive disorder, single episode, in partial remission: Secondary | ICD-10-CM | POA: Diagnosis not present

## 2019-06-11 DIAGNOSIS — I10 Essential (primary) hypertension: Secondary | ICD-10-CM | POA: Diagnosis not present

## 2019-06-11 DIAGNOSIS — E7800 Pure hypercholesterolemia, unspecified: Secondary | ICD-10-CM | POA: Diagnosis not present

## 2019-06-11 DIAGNOSIS — I6930 Unspecified sequelae of cerebral infarction: Secondary | ICD-10-CM | POA: Diagnosis not present

## 2019-06-11 DIAGNOSIS — I6529 Occlusion and stenosis of unspecified carotid artery: Secondary | ICD-10-CM

## 2019-06-19 ENCOUNTER — Other Ambulatory Visit: Payer: Self-pay

## 2019-06-19 ENCOUNTER — Ambulatory Visit (HOSPITAL_COMMUNITY)
Admission: RE | Admit: 2019-06-19 | Discharge: 2019-06-19 | Disposition: A | Discharge status: Home / Self Care | Payer: 59 | Source: Ambulatory Visit | Attending: Neurology | Admitting: Neurology

## 2019-06-19 ENCOUNTER — Ambulatory Visit (HOSPITAL_BASED_OUTPATIENT_CLINIC_OR_DEPARTMENT_OTHER)
Admission: RE | Admit: 2019-06-19 | Discharge: 2019-06-19 | Disposition: A | Discharge status: Home / Self Care | Payer: 59 | Source: Ambulatory Visit | Attending: Neurology | Admitting: Neurology

## 2019-06-19 DIAGNOSIS — I6529 Occlusion and stenosis of unspecified carotid artery: Secondary | ICD-10-CM | POA: Insufficient documentation

## 2019-06-29 ENCOUNTER — Other Ambulatory Visit: Payer: Self-pay

## 2019-06-29 DIAGNOSIS — R6889 Other general symptoms and signs: Secondary | ICD-10-CM | POA: Diagnosis not present

## 2019-06-29 DIAGNOSIS — Z20822 Contact with and (suspected) exposure to covid-19: Secondary | ICD-10-CM

## 2019-07-01 LAB — NOVEL CORONAVIRUS, NAA: SARS-CoV-2, NAA: NOT DETECTED

## 2019-07-01 MED FILL — ATORVASTATIN 10 MG TABLET: 10 | 90 days supply | Qty: 90 | Fill #0

## 2019-07-03 DIAGNOSIS — E7800 Pure hypercholesterolemia, unspecified: Secondary | ICD-10-CM | POA: Diagnosis not present

## 2019-07-04 MED FILL — PANTOPRAZOLE SOD DR 40 MG T: 40 | 90 days supply | Qty: 90 | Fill #0

## 2019-07-24 MED FILL — RAMIPRIL 5 MG CAPS: 5 | 90 days supply | Qty: 90 | Fill #0

## 2019-07-24 MED FILL — CLOPIDOGREL 75 MG TABLET: 75 | 90 days supply | Qty: 90 | Fill #0

## 2019-09-17 MED FILL — CITALOPRAM HBR 20 MG TABLET: 20 | 90 days supply | Qty: 90 | Fill #0

## 2019-09-23 MED FILL — ATORVASTATIN 10 MG TABLET: 10 | 90 days supply | Qty: 90 | Fill #1

## 2019-09-26 MED FILL — PANTOPRAZOLE SOD DR 40 MG T: 40 | 90 days supply | Qty: 90 | Fill #1

## 2019-10-05 DIAGNOSIS — B349 Viral infection, unspecified: Secondary | ICD-10-CM | POA: Diagnosis not present

## 2019-10-05 DIAGNOSIS — J0100 Acute maxillary sinusitis, unspecified: Secondary | ICD-10-CM | POA: Diagnosis not present

## 2019-10-05 MED FILL — AZITHROMYCIN 250 MG TABLET: 250 | 5 days supply | Qty: 6 | Fill #0

## 2019-10-05 MED FILL — BENZONATATE 200 MG CAP: 200 | 10 days supply | Qty: 30 | Fill #0

## 2019-10-16 MED FILL — RAMIPRIL 5 MG CAPS: 5 | 90 days supply | Qty: 90 | Fill #1

## 2019-10-16 MED FILL — CLOPIDOGREL 75 MG TABLET: 75 | 90 days supply | Qty: 90 | Fill #1

## 2019-10-21 ENCOUNTER — Other Ambulatory Visit: Payer: Self-pay

## 2019-10-21 DIAGNOSIS — Z20822 Contact with and (suspected) exposure to covid-19: Secondary | ICD-10-CM

## 2019-10-23 LAB — NOVEL CORONAVIRUS, NAA: SARS-CoV-2, NAA: NOT DETECTED

## 2019-11-20 ENCOUNTER — Telehealth: Payer: Self-pay

## 2019-11-20 NOTE — Telephone Encounter (Signed)
Wife called to get test results for husband. Informed her that a nurs e would call back with an update.  Pt voiced understanding.   [place]

## 2019-11-20 NOTE — Telephone Encounter (Signed)
Called patient and they were looking for result on there son and not the dad

## 2019-12-10 MED FILL — CITALOPRAM HBR 20 MG TABLET: 20 | 90 days supply | Qty: 90 | Fill #1

## 2019-12-22 MED FILL — ATORVASTATIN 10 MG TABLET: 10 | 90 days supply | Qty: 90 | Fill #0

## 2019-12-26 MED FILL — PANTOPRAZOLE SOD DR 40 MG T: 40 | 90 days supply | Qty: 90 | Fill #0

## 2019-12-31 DIAGNOSIS — R0681 Apnea, not elsewhere classified: Secondary | ICD-10-CM | POA: Diagnosis not present

## 2019-12-31 DIAGNOSIS — I6930 Unspecified sequelae of cerebral infarction: Secondary | ICD-10-CM | POA: Diagnosis not present

## 2019-12-31 DIAGNOSIS — E7800 Pure hypercholesterolemia, unspecified: Secondary | ICD-10-CM | POA: Diagnosis not present

## 2019-12-31 DIAGNOSIS — K219 Gastro-esophageal reflux disease without esophagitis: Secondary | ICD-10-CM | POA: Diagnosis not present

## 2019-12-31 DIAGNOSIS — Z23 Encounter for immunization: Secondary | ICD-10-CM | POA: Diagnosis not present

## 2019-12-31 DIAGNOSIS — F324 Major depressive disorder, single episode, in partial remission: Secondary | ICD-10-CM | POA: Diagnosis not present

## 2019-12-31 DIAGNOSIS — I10 Essential (primary) hypertension: Secondary | ICD-10-CM | POA: Diagnosis not present

## 2019-12-31 MED FILL — ATORVASTATIN 10 MG TABLET: 10 | 90 days supply | Qty: 90 | Fill #0

## 2020-01-13 MED FILL — RAMIPRIL 5 MG CAPS: 5 | 90 days supply | Qty: 90 | Fill #0

## 2020-01-14 MED FILL — CLOPIDOGREL 75 MG TABLET: 75 | 90 days supply | Qty: 90 | Fill #0

## 2020-01-20 MED FILL — ATORVASTATIN 10 MG TABLET: 10 | 90 days supply | Qty: 90 | Fill #0

## 2020-02-01 ENCOUNTER — Other Ambulatory Visit (HOSPITAL_BASED_OUTPATIENT_CLINIC_OR_DEPARTMENT_OTHER): Payer: Self-pay

## 2020-02-01 DIAGNOSIS — R0683 Snoring: Secondary | ICD-10-CM

## 2020-02-01 DIAGNOSIS — G4730 Sleep apnea, unspecified: Secondary | ICD-10-CM

## 2020-02-01 DIAGNOSIS — G4710 Hypersomnia, unspecified: Secondary | ICD-10-CM

## 2020-03-09 MED FILL — CITALOPRAM HBR 20 MG TABLET: 20 | 90 days supply | Qty: 90 | Fill #0

## 2020-03-19 MED FILL — PANTOPRAZOLE SOD DR 40 MG T: 40 | 90 days supply | Qty: 90 | Fill #0

## 2020-03-24 MED FILL — CITALOPRAM HBR 20 MG TABLET: 20 | 90 days supply | Qty: 90 | Fill #0

## 2020-04-01 MED FILL — ATORVASTATIN 10 MG TABLET: 10 | 90 days supply | Qty: 90 | Fill #0

## 2020-04-04 MED FILL — PANTOPRAZOLE SOD DR 40 MG T: 40 | 90 days supply | Qty: 90 | Fill #0

## 2020-04-04 MED FILL — CITALOPRAM HBR 20 MG TABLET: 20 | 90 days supply | Qty: 90 | Fill #0

## 2020-04-06 MED FILL — RAMIPRIL 5 MG CAPS: 5 | 90 days supply | Qty: 90 | Fill #1

## 2020-04-07 ENCOUNTER — Ambulatory Visit (HOSPITAL_BASED_OUTPATIENT_CLINIC_OR_DEPARTMENT_OTHER): Payer: 59 | Attending: Internal Medicine | Admitting: Internal Medicine

## 2020-04-07 ENCOUNTER — Other Ambulatory Visit: Payer: Self-pay

## 2020-04-07 DIAGNOSIS — R0683 Snoring: Secondary | ICD-10-CM | POA: Diagnosis present

## 2020-04-07 DIAGNOSIS — G4730 Sleep apnea, unspecified: Secondary | ICD-10-CM

## 2020-04-07 DIAGNOSIS — G4733 Obstructive sleep apnea (adult) (pediatric): Secondary | ICD-10-CM | POA: Diagnosis not present

## 2020-04-07 DIAGNOSIS — G4710 Hypersomnia, unspecified: Secondary | ICD-10-CM

## 2020-04-07 MED FILL — CLOPIDOGREL 75 MG TABLET: 75 | 90 days supply | Qty: 90 | Fill #1

## 2020-04-17 NOTE — Procedures (Signed)
    NAME: Johnathan [name] DATE OF BIRTH:  07-27-1951 MEDICAL RECORD NUMBER [id number]  LOCATION: [facility]  PHYSICIAN: Marius Ditch  DATE OF STUDY: 04/07/2020  SLEEP STUDY TYPE: Out of Center Sleep Test                REFERRING PHYSICIAN: Marius Ditch, MD  INDICATION FOR STUDY: excessive daytime sleepiness, snoring, witnessed apnea  EPWORTH SLEEPINESS SCORE:  16 HEIGHT: 5\' 6"  (167.6 cm)  WEIGHT: 180 lb (81.6 kg)    Body mass index is 29.05 kg/m.  NECK SIZE: 16 in.  MEDICATIONS Patient self administered medications include: N/A. Medications administered during study include No sleep medicine administered.Marland Kitchen SLEEP STUDY TECHNIQUE A multi-channel overnight portable sleep study was performed. The channels recorded were: nasal and oral airflow, thoracic and abdominal respiratory movement, and oxygen saturation with a pulse oximetry. Snoring and body position were also monitored.  TECHNICIAN COMMENTS Comments added by Technician: N/A Comments added by Scorer: N/A  RECORDING SUMMARY The study was initiated at 11:41:16 PM and terminated at 6:38:04 AM. The total recorded time was 416.8 minutes. Time in bed was 372.2 minutes.  RESPIRATORY PARAMETERS The overall AHI was 14.1 per hour, with a central apnea index of 0.0 per hour. Supine the AHI was 28/hr and non-supine it was 10/hr. The sleep efficiency was 98.4 % and the patient was supine for 20.2%. The arousal index was 0.0 per hour.The oxygen nadir was 86% during sleep. He had an O2 sat of <89% for < 1 minute of the night.   CARDIAC DATA Mean heart rate during sleep was 58.6 bpm.  IMPRESSIONS - Mild Obstructive Sleep apnea(OSA)  DIAGNOSIS - Obstructive Sleep Apnea (327.23 [G47.33 ICD-10])  RECOMMENDATIONS - Due to excessive daytime sleepiness, the patient should be treated with weight loss (if appropriate), an oral appliance, or CPAP. If CPAP is chosen, this could be started by auto-CPAP or by CPAP  titration in the lab.  Marius Ditch Sleep specialist, [place] [employer]  ELECTRONICALLY SIGNED ON:  04/17/2020, 7:56 PM [place] PH: [phone]   FX: [phone] [place]

## 2020-06-24 MED FILL — ATORVASTATIN CALCIUM 10 MG: 10 | 90 days supply | Qty: 90 | Fill #1

## 2020-06-27 MED FILL — PANTOPRAZOLE SOD DR 40 MG T: 40 | 90 days supply | Qty: 90 | Fill #1

## 2020-06-27 MED FILL — CITALOPRAM HBR 20 MG TABLET: 20 | 90 days supply | Qty: 90 | Fill #1

## 2020-06-30 ENCOUNTER — Other Ambulatory Visit (HOSPITAL_COMMUNITY): Payer: Self-pay | Admitting: Family Medicine

## 2020-06-30 DIAGNOSIS — R5383 Other fatigue: Secondary | ICD-10-CM | POA: Diagnosis not present

## 2020-06-30 DIAGNOSIS — E7800 Pure hypercholesterolemia, unspecified: Secondary | ICD-10-CM | POA: Diagnosis not present

## 2020-06-30 DIAGNOSIS — F324 Major depressive disorder, single episode, in partial remission: Secondary | ICD-10-CM | POA: Diagnosis not present

## 2020-06-30 DIAGNOSIS — G4730 Sleep apnea, unspecified: Secondary | ICD-10-CM | POA: Diagnosis not present

## 2020-06-30 DIAGNOSIS — I10 Essential (primary) hypertension: Secondary | ICD-10-CM | POA: Diagnosis not present

## 2020-06-30 DIAGNOSIS — I6930 Unspecified sequelae of cerebral infarction: Secondary | ICD-10-CM | POA: Diagnosis not present

## 2020-06-30 DIAGNOSIS — R7309 Other abnormal glucose: Secondary | ICD-10-CM | POA: Diagnosis not present

## 2020-06-30 DIAGNOSIS — K219 Gastro-esophageal reflux disease without esophagitis: Secondary | ICD-10-CM | POA: Diagnosis not present

## 2020-07-05 MED FILL — RAMIPRIL 5 MG CAPS: 5 | 90 days supply | Qty: 90 | Fill #0

## 2020-07-06 MED FILL — CLOPIDOGREL 75 MG TABLET: 75 | 90 days supply | Qty: 90 | Fill #0

## 2020-09-22 MED FILL — ATORVASTATIN CALCIUM 10 MG: 10 | 90 days supply | Qty: 90 | Fill #0

## 2020-09-26 MED FILL — PANTOPRAZOLE SOD DR 40 MG T: 40 | 90 days supply | Qty: 90 | Fill #0

## 2020-09-26 MED FILL — CITALOPRAM HBR 20 MG TABLET: 20 | 90 days supply | Qty: 90 | Fill #0

## 2020-09-27 MED FILL — RAMIPRIL 5 MG CAPS: 5 | 90 days supply | Qty: 90 | Fill #1

## 2020-09-28 MED FILL — CLOPIDOGREL 75 MG TABLET: 75 | 90 days supply | Qty: 90 | Fill #1

## 2020-12-18 MED FILL — ATORVASTATIN CALCIUM 10 MG: 10 | 90 days supply | Qty: 90 | Fill #1

## 2020-12-19 MED FILL — CITALOPRAM HBR 20 MG TABLET: 20 | 90 days supply | Qty: 90 | Fill #1

## 2020-12-19 MED FILL — PANTOPRAZOLE SOD DR 40 MG T: 40 | 90 days supply | Qty: 90 | Fill #1

## 2020-12-26 ENCOUNTER — Other Ambulatory Visit (HOSPITAL_COMMUNITY): Payer: Self-pay | Admitting: Family Medicine

## 2020-12-26 MED FILL — RAMIPRIL 5 MG CAPS: 5 | 90 days supply | Qty: 90 | Fill #0

## 2020-12-27 ENCOUNTER — Other Ambulatory Visit (HOSPITAL_COMMUNITY): Payer: Self-pay | Admitting: Family Medicine

## 2020-12-27 DIAGNOSIS — R7303 Prediabetes: Secondary | ICD-10-CM | POA: Diagnosis not present

## 2020-12-27 DIAGNOSIS — K219 Gastro-esophageal reflux disease without esophagitis: Secondary | ICD-10-CM | POA: Diagnosis not present

## 2020-12-27 DIAGNOSIS — E7800 Pure hypercholesterolemia, unspecified: Secondary | ICD-10-CM | POA: Diagnosis not present

## 2020-12-27 DIAGNOSIS — I6930 Unspecified sequelae of cerebral infarction: Secondary | ICD-10-CM | POA: Diagnosis not present

## 2020-12-27 DIAGNOSIS — I10 Essential (primary) hypertension: Secondary | ICD-10-CM | POA: Diagnosis not present

## 2020-12-27 DIAGNOSIS — F324 Major depressive disorder, single episode, in partial remission: Secondary | ICD-10-CM | POA: Diagnosis not present

## 2020-12-27 DIAGNOSIS — R350 Frequency of micturition: Secondary | ICD-10-CM | POA: Diagnosis not present

## 2020-12-27 DIAGNOSIS — G4730 Sleep apnea, unspecified: Secondary | ICD-10-CM | POA: Diagnosis not present

## 2020-12-27 DIAGNOSIS — Z125 Encounter for screening for malignant neoplasm of prostate: Secondary | ICD-10-CM | POA: Diagnosis not present

## 2020-12-27 MED FILL — CLOPIDOGREL 75 MG TABLET: 75 | 90 days supply | Qty: 90 | Fill #0

## 2021-01-02 DIAGNOSIS — G4733 Obstructive sleep apnea (adult) (pediatric): Secondary | ICD-10-CM | POA: Diagnosis not present

## 2021-02-15 DIAGNOSIS — G4733 Obstructive sleep apnea (adult) (pediatric): Secondary | ICD-10-CM | POA: Diagnosis not present

## 2021-02-23 ENCOUNTER — Other Ambulatory Visit (HOSPITAL_BASED_OUTPATIENT_CLINIC_OR_DEPARTMENT_OTHER): Payer: Self-pay

## 2021-03-18 DIAGNOSIS — G4733 Obstructive sleep apnea (adult) (pediatric): Secondary | ICD-10-CM | POA: Diagnosis not present

## 2021-04-17 DIAGNOSIS — G4733 Obstructive sleep apnea (adult) (pediatric): Secondary | ICD-10-CM | POA: Diagnosis not present

## 2021-04-18 DIAGNOSIS — G4733 Obstructive sleep apnea (adult) (pediatric): Secondary | ICD-10-CM | POA: Diagnosis not present

## 2021-04-25 ENCOUNTER — Other Ambulatory Visit (HOSPITAL_COMMUNITY): Payer: Self-pay

## 2021-04-25 DIAGNOSIS — G4733 Obstructive sleep apnea (adult) (pediatric): Secondary | ICD-10-CM | POA: Diagnosis not present

## 2021-04-25 MED FILL — Citalopram Hydrobromide Tab 20 MG (Base Equiv): ORAL | 90 days supply | Qty: 90 | Fill #0 | Status: AC

## 2021-04-25 MED FILL — Atorvastatin Calcium Tab 10 MG (Base Equivalent): ORAL | 90 days supply | Qty: 90 | Fill #0 | Status: AC

## 2021-04-25 MED FILL — Ramipril Cap 5 MG: ORAL | 90 days supply | Qty: 90 | Fill #0 | Status: AC

## 2021-04-25 MED FILL — Clopidogrel Bisulfate Tab 75 MG (Base Equiv): ORAL | 90 days supply | Qty: 90 | Fill #0 | Status: AC

## 2021-04-25 MED FILL — Pantoprazole Sodium EC Tab 40 MG (Base Equiv): ORAL | 90 days supply | Qty: 90 | Fill #0 | Status: AC

## 2021-05-15 DIAGNOSIS — G4733 Obstructive sleep apnea (adult) (pediatric): Secondary | ICD-10-CM | POA: Diagnosis not present

## 2021-05-18 DIAGNOSIS — G4733 Obstructive sleep apnea (adult) (pediatric): Secondary | ICD-10-CM | POA: Diagnosis not present

## 2021-05-30 ENCOUNTER — Other Ambulatory Visit (HOSPITAL_BASED_OUTPATIENT_CLINIC_OR_DEPARTMENT_OTHER): Payer: Self-pay

## 2021-05-30 DIAGNOSIS — G4733 Obstructive sleep apnea (adult) (pediatric): Secondary | ICD-10-CM

## 2021-05-30 DIAGNOSIS — G4731 Primary central sleep apnea: Secondary | ICD-10-CM

## 2021-06-19 ENCOUNTER — Encounter (HOSPITAL_BASED_OUTPATIENT_CLINIC_OR_DEPARTMENT_OTHER): Payer: 59 | Admitting: Internal Medicine

## 2021-06-26 ENCOUNTER — Other Ambulatory Visit (HOSPITAL_COMMUNITY): Payer: Self-pay

## 2021-06-26 DIAGNOSIS — K219 Gastro-esophageal reflux disease without esophagitis: Secondary | ICD-10-CM | POA: Diagnosis not present

## 2021-06-26 DIAGNOSIS — G4733 Obstructive sleep apnea (adult) (pediatric): Secondary | ICD-10-CM | POA: Diagnosis not present

## 2021-06-26 DIAGNOSIS — E7800 Pure hypercholesterolemia, unspecified: Secondary | ICD-10-CM | POA: Diagnosis not present

## 2021-06-26 DIAGNOSIS — G4730 Sleep apnea, unspecified: Secondary | ICD-10-CM | POA: Diagnosis not present

## 2021-06-26 DIAGNOSIS — I6930 Unspecified sequelae of cerebral infarction: Secondary | ICD-10-CM | POA: Diagnosis not present

## 2021-06-26 DIAGNOSIS — R7303 Prediabetes: Secondary | ICD-10-CM | POA: Diagnosis not present

## 2021-06-26 DIAGNOSIS — I10 Essential (primary) hypertension: Secondary | ICD-10-CM | POA: Diagnosis not present

## 2021-06-26 DIAGNOSIS — F324 Major depressive disorder, single episode, in partial remission: Secondary | ICD-10-CM | POA: Diagnosis not present

## 2021-06-26 MED ORDER — RAMIPRIL 5 MG PO CAPS
ORAL_CAPSULE | ORAL | 1 refills | Status: DC
  Filled 2021-07-06: qty 90, 90d supply, fill #0
  Filled 2021-10-29: qty 90, 90d supply, fill #1

## 2021-06-26 MED ORDER — PANTOPRAZOLE SODIUM 40 MG PO TBEC
DELAYED_RELEASE_TABLET | ORAL | 1 refills | Status: DC
  Filled 2021-07-06: qty 90, 90d supply, fill #0
  Filled 2021-10-29: qty 90, 90d supply, fill #1

## 2021-06-26 MED ORDER — ATORVASTATIN CALCIUM 10 MG PO TABS
ORAL_TABLET | ORAL | 1 refills | Status: DC
  Filled 2021-06-26: qty 90, 90d supply, fill #0
  Filled 2021-10-29: qty 90, 90d supply, fill #1

## 2021-06-26 MED ORDER — CITALOPRAM HYDROBROMIDE 20 MG PO TABS
ORAL_TABLET | ORAL | 1 refills | Status: DC
  Filled 2021-06-26: qty 90, 90d supply, fill #0
  Filled 2021-10-29: qty 90, 90d supply, fill #1

## 2021-06-26 MED ORDER — CLOPIDOGREL BISULFATE 75 MG PO TABS
ORAL_TABLET | ORAL | 1 refills | Status: DC
  Filled 2021-07-06: qty 90, 90d supply, fill #0
  Filled 2021-10-29: qty 90, 90d supply, fill #1

## 2021-07-06 ENCOUNTER — Other Ambulatory Visit (HOSPITAL_COMMUNITY): Payer: Self-pay

## 2021-07-09 ENCOUNTER — Other Ambulatory Visit: Payer: Self-pay

## 2021-07-09 ENCOUNTER — Ambulatory Visit (HOSPITAL_BASED_OUTPATIENT_CLINIC_OR_DEPARTMENT_OTHER): Payer: 59 | Attending: Internal Medicine | Admitting: Internal Medicine

## 2021-07-09 DIAGNOSIS — G4761 Periodic limb movement disorder: Secondary | ICD-10-CM | POA: Insufficient documentation

## 2021-07-09 DIAGNOSIS — G4733 Obstructive sleep apnea (adult) (pediatric): Secondary | ICD-10-CM | POA: Diagnosis not present

## 2021-07-09 DIAGNOSIS — G4731 Primary central sleep apnea: Secondary | ICD-10-CM

## 2021-07-10 DIAGNOSIS — G4733 Obstructive sleep apnea (adult) (pediatric): Secondary | ICD-10-CM | POA: Diagnosis not present

## 2021-07-17 NOTE — Procedures (Signed)
   NAME: Johnathan [name] DATE OF BIRTH:  1951-01-02 MEDICAL RECORD NUMBER [id number]  LOCATION: [facility]  PHYSICIAN: Marius Ditch  DATE OF STUDY: 07/09/2021  SLEEP STUDY TYPE: Positive Airway Pressure Titration               REFERRING PHYSICIAN: Marius Ditch, MD  EPWORTH SLEEPINESS SCORE:   HEIGHT: '5\' 6"'$  (167.6 cm)  WEIGHT: 174 lb (78.9 kg)    Body mass index is 28.08 kg/m.  NECK SIZE: 17 in.  CLINICAL INFORMATION The patient was referred to the sleep center for PAP titration. He had a HST dated 04/07/2020 that revealed OSA with an AHI of 14.1/h. No centrals were seen. He started APAP and developed some CSA. AHI was only down to 10/hr  MEDICATIONS No sleep medicine administered.Marland Kitchen  SLEEP STUDY TECHNIQUE The patient underwent an attended overnight polysomnography titration to assess the effects of cpap therapy. The following variables were monitored: EEG(C4-A1, C3-A2, O1-A2, O2-A1), EOG, submental and leg EMG, ECG, oxyhemoglobin saturation by pulse oximetry, thoracic and abdominal respiratory effort belts, nasal/oral airflow by pressure sensor, body position sensor and snoring sensor. CPAP pressure was titrated to eliminate apneas, hypopneas and oxygen desaturation.  TECHNICAL COMMENTS Comments added by Technician: Patient had difficulty initiating sleep. Patient was restless all through the night. Comments added by Scorer: N/A  SLEEP ARCHITECTURE The study was initiated at 11:09:15 PM and terminated at 5:11:19 AM. Total recorded time was 362.1 minutes. EEG confirmed total sleep time was 98 minutes yielding a sleep efficiency of 27.1%%. Sleep onset after lights out was 192.9 minutes with a REM latency of 69.5 minutes. The patient spent 4.1%% of the night in stage N1 sleep, 71.4%% in stage N2 sleep, 0.0%% in stage N3 and 24.5% in REM. The Arousal Index was 23.9/hour.  RESPIRATORY PARAMETERS The overall AHI was 0.0 per hour, and the RDI was 9.8 events/hour  with a central apnea index of 0per hour. The most appropriate setting of CPAP was IPAP/EPAP 14/14 cm H2O. At this setting, the sleep efficiency was 40% and the patient was supine for 98%. The AHI was 0 events per hour, and the RDI was 3.9 events/hour (with 0 central events) and the arousal index was 7.8 per hour.The oxygen nadir was 93.0% during sleep.  LEG MOVEMENT DATA The total leg movements were 112 with a resulting leg movement index of 68.6/hr. Associated arousal with leg movement index was 9.2/hr.  CARDIAC DATA The underlying cardiac rhythm was most consistent with sinus rhythm. Mean heart rate during sleep was 49.6 bpm. Additional rhythm abnormalities include None.  IMPRESSIONS - Mark reduced sleep efficiency - Obstructive Sleep Apnea (OSA). Optimal pressure attained. No central apnea noted.  - Moderate leg movements during sleep. Associated arousals were elevated with an arousal index of 9.2 /hour. Once optimal pressure reached, few leg movements noted  DIAGNOSIS - Obstructive Sleep Apnea (G47.33) - Periodic Limb Movement During Sleep (G47.61)  RECOMMENDATIONS - Trial of CPAP therapy on 14 cm H2O with ramp from 6 cm H20. Fixed pressure may resolved events better than APAP.   Marius Ditch Sleep specialist, [place] Board of [employer] Medicine  ELECTRONICALLY SIGNED ON:  07/17/2021, 8:13 PM [place] PH: [phone]   FX: [phone] [place]

## 2021-10-12 DIAGNOSIS — G4733 Obstructive sleep apnea (adult) (pediatric): Secondary | ICD-10-CM | POA: Diagnosis not present

## 2021-10-29 ENCOUNTER — Other Ambulatory Visit (HOSPITAL_COMMUNITY): Payer: Self-pay

## 2021-10-30 ENCOUNTER — Other Ambulatory Visit (HOSPITAL_COMMUNITY): Payer: Self-pay

## 2022-01-12 ENCOUNTER — Other Ambulatory Visit (HOSPITAL_COMMUNITY): Payer: Self-pay

## 2022-01-12 DIAGNOSIS — G4730 Sleep apnea, unspecified: Secondary | ICD-10-CM | POA: Diagnosis not present

## 2022-01-12 DIAGNOSIS — M6208 Separation of muscle (nontraumatic), other site: Secondary | ICD-10-CM | POA: Diagnosis not present

## 2022-01-12 DIAGNOSIS — R7303 Prediabetes: Secondary | ICD-10-CM | POA: Diagnosis not present

## 2022-01-12 DIAGNOSIS — Z23 Encounter for immunization: Secondary | ICD-10-CM | POA: Diagnosis not present

## 2022-01-12 DIAGNOSIS — I6930 Unspecified sequelae of cerebral infarction: Secondary | ICD-10-CM | POA: Diagnosis not present

## 2022-01-12 DIAGNOSIS — E7800 Pure hypercholesterolemia, unspecified: Secondary | ICD-10-CM | POA: Diagnosis not present

## 2022-01-12 DIAGNOSIS — I10 Essential (primary) hypertension: Secondary | ICD-10-CM | POA: Diagnosis not present

## 2022-01-12 DIAGNOSIS — Z0000 Encounter for general adult medical examination without abnormal findings: Secondary | ICD-10-CM | POA: Diagnosis not present

## 2022-01-12 DIAGNOSIS — Z1211 Encounter for screening for malignant neoplasm of colon: Secondary | ICD-10-CM | POA: Diagnosis not present

## 2022-01-12 DIAGNOSIS — K219 Gastro-esophageal reflux disease without esophagitis: Secondary | ICD-10-CM | POA: Diagnosis not present

## 2022-01-12 DIAGNOSIS — F324 Major depressive disorder, single episode, in partial remission: Secondary | ICD-10-CM | POA: Diagnosis not present

## 2022-01-12 MED ORDER — PANTOPRAZOLE SODIUM 40 MG PO TBEC
40.0000 mg | DELAYED_RELEASE_TABLET | Freq: Every day | ORAL | 1 refills | Status: DC
  Filled 2022-01-12: qty 90, 90d supply, fill #0
  Filled 2022-04-27: qty 90, 90d supply, fill #1

## 2022-01-12 MED ORDER — CLOPIDOGREL BISULFATE 75 MG PO TABS
75.0000 mg | ORAL_TABLET | Freq: Every day | ORAL | 1 refills | Status: DC
  Filled 2022-01-12: qty 90, 90d supply, fill #0
  Filled 2022-04-27: qty 90, 90d supply, fill #1

## 2022-01-12 MED ORDER — ATORVASTATIN CALCIUM 10 MG PO TABS
10.0000 mg | ORAL_TABLET | Freq: Every day | ORAL | 1 refills | Status: DC
  Filled 2022-01-12: qty 90, 90d supply, fill #0
  Filled 2022-04-27: qty 90, 90d supply, fill #1

## 2022-01-12 MED ORDER — RAMIPRIL 5 MG PO CAPS
5.0000 mg | ORAL_CAPSULE | Freq: Every day | ORAL | 1 refills | Status: DC
  Filled 2022-01-12: qty 90, 90d supply, fill #0
  Filled 2022-04-27: qty 90, 90d supply, fill #1

## 2022-01-12 MED ORDER — CITALOPRAM HYDROBROMIDE 20 MG PO TABS
20.0000 mg | ORAL_TABLET | Freq: Every day | ORAL | 1 refills | Status: DC
  Filled 2022-01-12: qty 90, 90d supply, fill #0
  Filled 2022-04-27: qty 90, 90d supply, fill #1

## 2022-01-26 DIAGNOSIS — E875 Hyperkalemia: Secondary | ICD-10-CM | POA: Diagnosis not present

## 2022-03-07 ENCOUNTER — Emergency Department: Payer: 59

## 2022-03-07 ENCOUNTER — Encounter: Payer: Self-pay | Admitting: Emergency Medicine

## 2022-03-07 ENCOUNTER — Emergency Department
Admission: EM | Admit: 2022-03-07 | Discharge: 2022-03-07 | Disposition: A | Discharge status: Home / Self Care | Payer: 59 | Attending: Emergency Medicine | Admitting: Emergency Medicine

## 2022-03-07 ENCOUNTER — Other Ambulatory Visit: Payer: Self-pay

## 2022-03-07 DIAGNOSIS — W228XXA Striking against or struck by other objects, initial encounter: Secondary | ICD-10-CM | POA: Diagnosis not present

## 2022-03-07 DIAGNOSIS — S62635A Displaced fracture of distal phalanx of left ring finger, initial encounter for closed fracture: Secondary | ICD-10-CM | POA: Diagnosis not present

## 2022-03-07 DIAGNOSIS — S61215A Laceration without foreign body of left ring finger without damage to nail, initial encounter: Secondary | ICD-10-CM | POA: Diagnosis not present

## 2022-03-07 DIAGNOSIS — S6992XA Unspecified injury of left wrist, hand and finger(s), initial encounter: Secondary | ICD-10-CM | POA: Diagnosis present

## 2022-03-07 DIAGNOSIS — Z23 Encounter for immunization: Secondary | ICD-10-CM | POA: Insufficient documentation

## 2022-03-07 DIAGNOSIS — S61315A Laceration without foreign body of left ring finger with damage to nail, initial encounter: Secondary | ICD-10-CM | POA: Insufficient documentation

## 2022-03-07 DIAGNOSIS — Y9389 Activity, other specified: Secondary | ICD-10-CM | POA: Diagnosis not present

## 2022-03-07 MED ORDER — CEPHALEXIN 500 MG PO CAPS: 500.0000 mg | ORAL_CAPSULE | Freq: Four times a day (QID) | ORAL | 0 refills | Status: AC

## 2022-03-07 MED ORDER — LIDOCAINE HCL 1 % IJ SOLN
5.0000 mL | Freq: Once | INTRAMUSCULAR | Status: AC
  Administered 2022-03-07: 5 mL
  Filled 2022-03-07: qty 10

## 2022-03-07 MED ORDER — TETANUS-DIPHTH-ACELL PERTUSSIS 5-2.5-18.5 LF-MCG/0.5 IM SUSY
0.5000 mL | PREFILLED_SYRINGE | Freq: Once | INTRAMUSCULAR | Status: AC
  Administered 2022-03-07: 0.5 mL via INTRAMUSCULAR
  Filled 2022-03-07: qty 0.5

## 2022-03-07 NOTE — ED Triage Notes (Signed)
Pt comes into the ED via POV c/o left ring finger injury.  Pt states he smashed it today while splitting wood.  Bleeding under control at this time.  Pt in NAD with even and unlabored respirations.  ?

## 2022-03-07 NOTE — Discharge Instructions (Addendum)
Take Keflex 4 times daily for the next 7 days. ?Sutures need to stay in place for 10 days. ?Please change dressings once daily. ?You can leave Surgicel, (antibleeding paper) in place as it will come off on its own. ?Foreskin that is sutured, you can apply a thin layer of Vaseline once daily to prevent scabbing from trapping suture. ?If you notice any redness or streaking surround the wound site, please return for reevaluation. ?Please make a follow-up appointment with your orthopedist, Dr. Dean/ ?

## 2022-03-07 NOTE — ED Provider Notes (Signed)
? ?[facility] ?Provider Note ? ?Patient Contact: 3:21 PM (approximate) ? ? ?History  ? ?Finger Injury ? ? ?HPI ? ?Johnathan [name] is a 71 y.o. male presents to the emergency department with a left ring finger laceration that was sustained from a hydraulic wood splitter.  Patient reports that his tetanus status is up-to-date.  No numbness or tingling in the left hand.  No similar injuries in the past. ? ?  ? ? ?Physical Exam  ? ?Triage Vital Signs: ?ED Triage Vitals  ?Enc Vitals Group  ?   BP 03/07/22 1433 129/77  ?   Pulse Rate 03/07/22 1433 76  ?   Resp 03/07/22 1433 18  ?   Temp 03/07/22 1433 97.8 ?F (36.6 ?C)  ?   Temp Source 03/07/22 1433 Oral  ?   SpO2 03/07/22 1433 97 %  ?   Weight 03/07/22 1430 173 lb 15.1 oz (78.9 kg)  ?   Height 03/07/22 1430 '5\' 6"'$  (1.676 m)  ?   Head Circumference --   ?   Peak Flow --   ?   Pain Score 03/07/22 1430 5  ?   Pain Loc --   ?   Pain Edu? --   ?   Excl. in [place]? --   ? ? ?Most recent vital signs: ?Vitals:  ? 03/07/22 1433  ?BP: 129/77  ?Pulse: 76  ?Resp: 18  ?Temp: 97.8 ?F (36.6 ?C)  ?SpO2: 97%  ? ? ? ?General: Alert and in no acute distress. ?Eyes:  PERRL. EOMI. ?Head: No acute traumatic findings ?ENT: ?     Ears:  ?     Nose: No congestion/rhinnorhea. ?     Mouth/Throat: Mucous membranes are moist.  ?Neck: No stridor. No cervical spine tenderness to palpation. ?Cardiovascular:  Good peripheral perfusion ?Respiratory: Normal respiratory effort without tachypnea or retractions. Lungs CTAB. Good air entry to the bases with no decreased or absent breath sounds. ?Gastrointestinal: Bowel sounds ?4 quadrants. Soft and nontender to palpation. No guarding or rigidity. No palpable masses. No distention. No CVA tenderness. ?Musculoskeletal: No flexor or extensor tendon deficits appreciated with testing.  Patient has 2 cm gaping laceration of left ring finger with complete avulsion of the fingernail.  Palpable radial and ulnar pulses bilaterally and symmetrically.   Capillary refill less than 2 seconds on the left. ?Neurologic:  No gross focal neurologic deficits are appreciated.  ?Skin:   No rash noted ?Other: ? ? ?ED Results / Procedures / Treatments  ? ?Labs ?(all labs ordered are listed, but only abnormal results are displayed) ?Labs Reviewed - No data to display ? ? ? ? ?RADIOLOGY ? ?I personally viewed and evaluated these images as part of my medical decision making, as well as reviewing the written report by the radiologist. ? ?ED Provider Interpretation: I personally reviewed x-ray of left third digit and patient has a severely comminuted distal phalanx fracture consistent with an open distal tuft injury. ? ? ?PROCEDURES: ? ?Critical Care performed: No ? ?Marland Kitchen.Laceration Repair ? ?Date/Time: 03/07/2022 3:25 PM ?Performed by: Vallarie Mare M, PA-C ?Authorized by: Vallarie Mare M, PA-C  ? ?Consent:  ?  Consent obtained:  Verbal ?  Risks discussed:  Infection and pain ?Universal protocol:  ?  Procedure explained and questions answered to patient or proxy's satisfaction: yes   ?  Patient identity confirmed:  Verbally with patient ?Anesthesia:  ?  Anesthesia method:  Nerve block ?  Block anesthetic:  Lidocaine 1% w/o  epi ?Laceration details:  ?  Location:  Finger ?  Finger location:  L ring finger ?  Length (cm):  2 ?  Depth (mm):  5 ?Pre-procedure details:  ?  Preparation:  Patient was prepped and draped in usual sterile fashion ?Exploration:  ?  Limited defect created (wound extended): yes   ?  Contaminated: yes   ?Treatment:  ?  Area cleansed with:  Povidone-iodine ?  Amount of cleaning:  Standard ?  Irrigation method:  Pressure wash ?  Debridement:  None ?Skin repair:  ?  Repair method:  Sutures ?  Suture size:  5-0 ?  Suture technique:  Running locked ?  Number of sutures:  12 ?Approximation:  ?  Approximation:  Close ?Repair type:  ?  Repair type:  Intermediate ?Post-procedure details:  ?  Dressing:  Non-adherent dressing and splint for protection ? ? ?MEDICATIONS ORDERED  IN ED: ?Medications  ?Tdap (BOOSTRIX) injection 0.5 mL (has no administration in time range)  ?lidocaine (XYLOCAINE) 1 % (with pres) injection 5 mL (5 mLs Infiltration Given by Other 03/07/22 1533)  ? ? ? ?IMPRESSION / MDM / ASSESSMENT AND PLAN / ED COURSE  ?I reviewed the triage vital signs and the nursing notes. ?             ?               ?Assessment and plan ?Open fracture ?Differential diagnosis includes, but is not limited to, laceration, fracture ? ?71 year old male presents to the emergency department with a 2 cm laceration along the volar aspect of the left ring finger. ? ?X-ray was reviewed which shows a comminuted distal phalanx fracture. ?  ?Laceration repair occurred without complication and patient's digit was splinted into extension.  I recommended that patient follow-up with orthopedics and patient requested to follow-up with his orthopedist, Dr. Marlou Sa.  Patient was discharged with Keflex and his tetanus status was updated prior to discharge. ? ?FINAL CLINICAL IMPRESSION(S) / ED DIAGNOSES  ? ?Final diagnoses:  ?Laceration of left ring finger without foreign body with damage to nail, initial encounter  ? ? ? ?Rx / DC Orders  ? ?ED Discharge Orders   ? ?      Ordered  ?  cephALEXin (KEFLEX) 500 MG capsule  4 times daily       ? 03/07/22 1612  ? ?  ?  ? ?  ? ? ? ?Note:  This document was prepared using Dragon voice recognition software and may include unintentional dictation errors. ?  ?Lannie Fields, PA-C ?03/07/22 1617 ? ?  ?Naaman Plummer, MD ?03/07/22 2158 ? ?

## 2022-03-18 ENCOUNTER — Ambulatory Visit
Admission: RE | Admit: 2022-03-18 | Discharge: 2022-03-18 | Disposition: A | Discharge status: Home / Self Care | Payer: 59 | Source: Ambulatory Visit | Attending: Family Medicine | Admitting: Family Medicine

## 2022-03-18 NOTE — ED Triage Notes (Signed)
Suture removal for ring finger on left  hand ?

## 2022-04-28 ENCOUNTER — Other Ambulatory Visit (HOSPITAL_COMMUNITY): Payer: Self-pay

## 2022-05-01 ENCOUNTER — Other Ambulatory Visit (HOSPITAL_COMMUNITY): Payer: Self-pay

## 2022-07-24 ENCOUNTER — Other Ambulatory Visit (HOSPITAL_COMMUNITY): Payer: Self-pay

## 2022-07-24 DIAGNOSIS — F324 Major depressive disorder, single episode, in partial remission: Secondary | ICD-10-CM | POA: Diagnosis not present

## 2022-07-24 DIAGNOSIS — I10 Essential (primary) hypertension: Secondary | ICD-10-CM | POA: Diagnosis not present

## 2022-07-24 DIAGNOSIS — G4730 Sleep apnea, unspecified: Secondary | ICD-10-CM | POA: Diagnosis not present

## 2022-07-24 DIAGNOSIS — R7303 Prediabetes: Secondary | ICD-10-CM | POA: Diagnosis not present

## 2022-07-24 DIAGNOSIS — I6930 Unspecified sequelae of cerebral infarction: Secondary | ICD-10-CM | POA: Diagnosis not present

## 2022-07-24 DIAGNOSIS — K219 Gastro-esophageal reflux disease without esophagitis: Secondary | ICD-10-CM | POA: Diagnosis not present

## 2022-07-24 DIAGNOSIS — M6208 Separation of muscle (nontraumatic), other site: Secondary | ICD-10-CM | POA: Diagnosis not present

## 2022-07-24 DIAGNOSIS — E7800 Pure hypercholesterolemia, unspecified: Secondary | ICD-10-CM | POA: Diagnosis not present

## 2022-07-24 DIAGNOSIS — Z23 Encounter for immunization: Secondary | ICD-10-CM | POA: Diagnosis not present

## 2022-07-24 MED ORDER — ATORVASTATIN CALCIUM 10 MG PO TABS
ORAL_TABLET | ORAL | 1 refills | Status: DC
  Filled 2022-07-24 – 2022-07-26 (×2): qty 90, 90d supply, fill #0
  Filled 2022-10-29: qty 90, 90d supply, fill #1

## 2022-07-24 MED ORDER — PANTOPRAZOLE SODIUM 40 MG PO TBEC
DELAYED_RELEASE_TABLET | ORAL | 1 refills | Status: DC
  Filled 2022-07-24 – 2022-07-26 (×2): qty 90, 90d supply, fill #0
  Filled 2022-10-29: qty 90, 90d supply, fill #1

## 2022-07-24 MED ORDER — CLOPIDOGREL BISULFATE 75 MG PO TABS
ORAL_TABLET | ORAL | 1 refills | Status: DC
  Filled 2022-07-24 – 2022-07-26 (×2): qty 90, 90d supply, fill #0
  Filled 2022-10-29: qty 90, 90d supply, fill #1

## 2022-07-24 MED ORDER — CITALOPRAM HYDROBROMIDE 20 MG PO TABS
ORAL_TABLET | ORAL | 1 refills | Status: DC
  Filled 2022-07-24 – 2022-07-26 (×2): qty 90, 90d supply, fill #0
  Filled 2022-10-29: qty 90, 90d supply, fill #1

## 2022-07-24 MED ORDER — RAMIPRIL 5 MG PO CAPS
ORAL_CAPSULE | ORAL | 1 refills | Status: DC
  Filled 2022-07-24 – 2022-07-26 (×2): qty 90, 90d supply, fill #0
  Filled 2022-10-29: qty 90, 90d supply, fill #1

## 2022-07-27 ENCOUNTER — Other Ambulatory Visit (HOSPITAL_COMMUNITY): Payer: Self-pay

## 2022-10-29 ENCOUNTER — Other Ambulatory Visit (HOSPITAL_COMMUNITY): Payer: Self-pay

## 2023-01-15 ENCOUNTER — Other Ambulatory Visit: Payer: Self-pay

## 2023-01-15 ENCOUNTER — Other Ambulatory Visit (HOSPITAL_COMMUNITY): Payer: Self-pay

## 2023-01-15 DIAGNOSIS — M6208 Separation of muscle (nontraumatic), other site: Secondary | ICD-10-CM | POA: Diagnosis not present

## 2023-01-15 DIAGNOSIS — Z0000 Encounter for general adult medical examination without abnormal findings: Secondary | ICD-10-CM | POA: Diagnosis not present

## 2023-01-15 DIAGNOSIS — I6930 Unspecified sequelae of cerebral infarction: Secondary | ICD-10-CM | POA: Diagnosis not present

## 2023-01-15 DIAGNOSIS — Z1211 Encounter for screening for malignant neoplasm of colon: Secondary | ICD-10-CM | POA: Diagnosis not present

## 2023-01-15 DIAGNOSIS — F324 Major depressive disorder, single episode, in partial remission: Secondary | ICD-10-CM | POA: Diagnosis not present

## 2023-01-15 DIAGNOSIS — I10 Essential (primary) hypertension: Secondary | ICD-10-CM | POA: Diagnosis not present

## 2023-01-15 DIAGNOSIS — R7303 Prediabetes: Secondary | ICD-10-CM | POA: Diagnosis not present

## 2023-01-15 DIAGNOSIS — K219 Gastro-esophageal reflux disease without esophagitis: Secondary | ICD-10-CM | POA: Diagnosis not present

## 2023-01-15 DIAGNOSIS — E7800 Pure hypercholesterolemia, unspecified: Secondary | ICD-10-CM | POA: Diagnosis not present

## 2023-01-15 DIAGNOSIS — G4730 Sleep apnea, unspecified: Secondary | ICD-10-CM | POA: Diagnosis not present

## 2023-01-15 MED ORDER — CLOPIDOGREL BISULFATE 75 MG PO TABS
75.0000 mg | ORAL_TABLET | Freq: Every day | ORAL | 1 refills | Status: DC
  Filled 2023-01-15: qty 90, 90d supply, fill #0
  Filled 2023-04-18: qty 90, 90d supply, fill #1

## 2023-01-15 MED ORDER — ATORVASTATIN CALCIUM 10 MG PO TABS
10.0000 mg | ORAL_TABLET | Freq: Every day | ORAL | 1 refills | Status: DC
  Filled 2023-01-15: qty 90, 90d supply, fill #0
  Filled 2023-04-18: qty 90, 90d supply, fill #1

## 2023-01-15 MED ORDER — CITALOPRAM HYDROBROMIDE 20 MG PO TABS
20.0000 mg | ORAL_TABLET | Freq: Every day | ORAL | 1 refills | Status: DC
  Filled 2023-01-15: qty 90, 90d supply, fill #0
  Filled 2023-04-18: qty 90, 90d supply, fill #1

## 2023-01-15 MED ORDER — PANTOPRAZOLE SODIUM 40 MG PO TBEC
40.0000 mg | DELAYED_RELEASE_TABLET | Freq: Every day | ORAL | 1 refills | Status: DC
  Filled 2023-01-15: qty 90, 90d supply, fill #0
  Filled 2023-04-18: qty 90, 90d supply, fill #1

## 2023-01-15 MED ORDER — RAMIPRIL 5 MG PO CAPS
5.0000 mg | ORAL_CAPSULE | Freq: Every day | ORAL | 1 refills | Status: DC
  Filled 2023-01-15: qty 90, 90d supply, fill #0
  Filled 2023-04-18: qty 90, 90d supply, fill #1

## 2023-01-28 ENCOUNTER — Other Ambulatory Visit (HOSPITAL_COMMUNITY): Payer: Self-pay

## 2023-01-28 ENCOUNTER — Other Ambulatory Visit: Payer: Self-pay

## 2023-01-28 MED ORDER — RAMIPRIL 10 MG PO CAPS
10.0000 mg | ORAL_CAPSULE | Freq: Every day | ORAL | 1 refills | Status: DC
  Filled 2023-01-28: qty 90, 90d supply, fill #0

## 2023-03-05 ENCOUNTER — Encounter: Payer: Self-pay | Admitting: Neurology

## 2023-03-05 ENCOUNTER — Ambulatory Visit (INDEPENDENT_AMBULATORY_CARE_PROVIDER_SITE_OTHER): Payer: 59 | Admitting: Neurology

## 2023-03-05 VITALS — BP 136/72 | HR 59 | Ht 64.0 in | Wt 181.5 lb

## 2023-03-05 DIAGNOSIS — Z8673 Personal history of transient ischemic attack (TIA), and cerebral infarction without residual deficits: Secondary | ICD-10-CM

## 2023-03-05 DIAGNOSIS — I6990 Unspecified sequelae of unspecified cerebrovascular disease: Secondary | ICD-10-CM

## 2023-03-05 DIAGNOSIS — R42 Dizziness and giddiness: Secondary | ICD-10-CM

## 2023-03-05 NOTE — Patient Instructions (Signed)
I had a long d/w patient and his wife about his remote stroke, new complaints constant dizziness, risk for recurrent stroke/TIAs, personally independently reviewed imaging studies and stroke evaluation results and answered questions.Continue clopidogrel 75 mg daily  for secondary stroke prevention and maintain strict control of hypertension with blood pressure goal below 130/90, diabetes with hemoglobin A1c goal below 6.5% and lipids with LDL cholesterol goal below 70 mg/dL. I also advised the patient to eat a healthy diet with plenty of whole grains, cereals, fruits and vegetables, exercise regularly and maintain ideal body weight .check screening carotid ultrasound and transcranial Doppler studies.  I recommend he avoid sudden neck movements and get up slowly and do orthostatic tolerance exercises prior to getting up if he has been sitting down or lying down for a prolonged. Period of time. .  Followup in the future with my nurse practitioner in 6 months or call earlier if necessary.

## 2023-03-05 NOTE — Progress Notes (Signed)
[facility] [address] [place]. [place] [address] [phone]       OFFICE CONSULT NOTE  Mr. PERL KLAUSER Date of Birth:  [birth year] Medical Record Number:  [id number]   Referring MD: Antony Contras  Reason for Referral: Dizziness  HPI: [name] is a 72 year old pleasant Caucasian male seen today for office consultation visit for dizzy spells.  History is obtained from the patient and his wife as well as review of referral notes and electronic medical records personally reviewed pertinent available imaging films in PACS.  He has past medical history of hypertension, hyperlipidemia, GERD, depression, right MCA infarct with 2017 with good recovery.  Patient states that for the last month or so has noticed intermittent dizzy spells.  He describes this as a feeling of being off balance and needing to hold on but denies true vertigo, double vision, dysarthria, extremity weakness or numbness.  Difficulty mostly when he gets up quickly and bends down suddenly.  This lasted for a few moments and is able to hold himself steady in nature..  He has had some issues with high blood pressure in recent months and his primary care physician has increased the dose of his Norvasc from 5 mg to 10 mg a month ago.  Patient has not been doing any orthostatic balance exercises.  Blood pressure is doing well now and today it is 136/72.  Patient denies any recent stroke or TIA symptoms.  Remains on Plavix which is tolerating well with only minor bruising and no bleeding.  He has not had any recent neurovascular studies.  He was last seen by me in the office on 04/20/2019 for follow-up for his right MCA infarct in June 2017 for which she had undergone PTCA followed by successful mechanical thrombectomy for right M1 occlusion.  He is quite well with very minimal residual left-sided deficits.  He denies any tinnitus, decreased hearing or prior history of positional vertigo.  ROS:   14 system review of  systems is positive for dizziness, imbalance, difficulty walking all other systems negative.  PMH:  Past Medical History:  Diagnosis Date   Depression    GERD (gastroesophageal reflux disease)    HTN (hypertension)    Hyperlipidemia    Insomnia    Stroke     Social History:  Social History   Socioeconomic History   Marital status: Married    Spouse name: Not on file   Number of children: Not on file   Years of education: Not on file   Highest education level: Not on file  Occupational History   Not on file  Tobacco Use   Smoking status: Former    Packs/day: 1    Types: Cigarettes    Quit date: 05/21/2016    Years since quitting: 6.7   Smokeless tobacco: Never  Vaping Use   Vaping Use: Never used  Substance and Sexual Activity   Alcohol use: No   Drug use: No   Sexual activity: Not on file  Other Topics Concern   Not on file  Social History Narrative   Not on file   Social Determinants of Health   Financial Resource Strain: Not on file  Food Insecurity: Not on file  Transportation Needs: Not on file  Physical Activity: Not on file  Stress: Not on file  Social Connections: Not on file  Intimate Partner Violence: Not on file    Medications:   Current Outpatient Medications on File Prior to Visit  Medication Sig  Dispense Refill   atorvastatin (LIPITOR) 10 MG tablet Take 1 tablet (10 mg total) by mouth daily. 90 tablet 1   citalopram (CELEXA) 20 MG tablet Take 1 tablet (20 mg total) by mouth daily. 90 tablet 1   clopidogrel (PLAVIX) 75 MG tablet Take 1 tablet (75 mg total) by mouth daily. 90 tablet 1   pantoprazole (PROTONIX) 40 MG tablet Take 1 tablet (40 mg total) by mouth daily. 90 tablet 1   ramipril (ALTACE) 5 MG capsule Take 1 capsule (5 mg total) by mouth daily. (Patient taking differently: Take 10 mg by mouth daily.) 90 capsule 1   Current Facility-Administered Medications on File Prior to Visit  Medication Dose Route Frequency Provider Last Rate Last  Admin   0.9 %  sodium chloride infusion  500 mL Intravenous Once Milus Banister, MD        Allergies:  No Known Allergies  Physical Exam General: Mildly obese pleasant elderly Caucasian male seated, in no evident distress Head: head normocephalic and atraumatic.   Neck: supple with no carotid or supraclavicular bruits Cardiovascular: regular rate and rhythm, no murmurs Musculoskeletal: no deformity Skin:  no rash/petichiae Vascular:  Normal pulses all extremities  Neurologic Exam Mental Status: Awake and fully alert. Oriented to place and time. Recent and remote memory intact. Attention span, concentration and fund of knowledge appropriate. Mood and affect appropriate.  Cranial Nerves: Fundoscopic exam reveals sharp disc margins. Pupils equal, briskly reactive to light. Extraocular movements full without nystagmus. Visual fields full to confrontation. Hearing intact. Facial sensation intact. Face, tongue, palate moves normally and symmetrically.  Motor: Normal bulk and tone. Normal strength in all tested extremity muscles.  Diminished fine finger movements on the left.  Orbits right over left upper extremity. Sensory.: intact to touch , pinprick , position and vibratory sensation.  Coordination: Rapid alternating movements normal in all extremities. Finger-to-nose and heel-to-shin performed accurately bilaterally. Gait and Station: Arises from chair without difficulty. Stance is normal. Gait demonstrates slight dragging of the left leg.. Able to heel, toe and tandem walk with moderate difficulty.  Reflexes: 1+ and symmetric. Toes downgoing.   NIHSS  0 Modified Rankin  2   ASSESSMENT: 72 year old Caucasian male with transient dizziness likely multifactorial due to combination of orthostatic hypotension, cerebrovascular disease , medication effect from increasing Norvasc dose and vestibular dysfunction.  Remote history of right MCA branch infarct in June 2017 due to right ICA occlusion  treated with IV tPA and successful mechanical thrombectomy with rescue angioplasty and stenting with subsequent stent reocclusion.  Vascular risk factors of hypertension, hyperlipidemia, remote smoking, obesity and carotid disease     PLAN:I had a long d/w patient and his wife about his remote stroke, new complaints of transient dizziness, risk for recurrent stroke/TIAs, personally independently reviewed imaging studies and stroke evaluation results and answered questions.Continue clopidogrel 75 mg daily  for secondary stroke prevention and maintain strict control of hypertension with blood pressure goal below 130/90, diabetes with hemoglobin A1c goal below 6.5% and lipids with LDL cholesterol goal below 70 mg/dL. I also advised the patient to eat a healthy diet with plenty of whole grains, cereals, fruits and vegetables, exercise regularly and maintain ideal body weight .check screening carotid ultrasound and transcranial Doppler studies.  I recommend he avoid sudden neck movements and get up slowly and do orthostatic tolerance exercises prior to getting up if he has been sitting down or lying down for a prolonged. Period of time. . Followup in the future with  my nurse practitioner in 6 months or call earlier if necessary.  Greater than 50% time during this 45-minute consultation.  Coordination of care about his new complaint of dizziness remote stroke and  Antony Contras, MD Note: This document was prepared with digital dictation and possible smart phrase technology. Any transcriptional errors that result from this process are unintentional.

## 2023-03-27 ENCOUNTER — Ambulatory Visit (HOSPITAL_COMMUNITY): Payer: 59

## 2023-04-08 ENCOUNTER — Ambulatory Visit (HOSPITAL_COMMUNITY)
Admission: RE | Admit: 2023-04-08 | Discharge: 2023-04-08 | Disposition: A | Discharge status: Home / Self Care | Payer: 59 | Source: Ambulatory Visit | Attending: Neurology | Admitting: Neurology

## 2023-04-08 ENCOUNTER — Ambulatory Visit (HOSPITAL_COMMUNITY)
Admission: RE | Admit: 2023-04-08 | Discharge: 2023-04-08 | Discharge status: Home / Self Care | Payer: 59 | Source: Ambulatory Visit | Attending: Neurology | Admitting: Neurology

## 2023-04-08 DIAGNOSIS — I6990 Unspecified sequelae of unspecified cerebrovascular disease: Secondary | ICD-10-CM

## 2023-04-08 DIAGNOSIS — I6521 Occlusion and stenosis of right carotid artery: Secondary | ICD-10-CM | POA: Diagnosis not present

## 2023-04-08 NOTE — Progress Notes (Signed)
VASCULAR LAB    Carotid duplex has been performed.  See CV proc for preliminary results.   Carmella Kees, RVT 04/08/2023, 3:06 PM

## 2023-04-08 NOTE — Progress Notes (Signed)
VASCULAR LAB    TCD has been performed.  See CV proc for preliminary results.   Maitri Schnoebelen, RVT 04/08/2023, 3:05 PM

## 2023-04-18 ENCOUNTER — Other Ambulatory Visit: Payer: Self-pay

## 2023-04-19 NOTE — Progress Notes (Signed)
Kindly inform the patient that transcranial Doppler study was suboptimal due to thick scar and poor windows.  The identified blood vessels did not show any major blockages to worry about

## 2023-04-19 NOTE — Progress Notes (Signed)
Kindly inform the patient that carotid ultrasound shows mild less than 50% narrowing of the carotid artery in the neck on the left.  This can be managed with medical treatment but will need follow-up every 1 to 2 years.

## 2023-07-16 DIAGNOSIS — I10 Essential (primary) hypertension: Secondary | ICD-10-CM | POA: Diagnosis not present

## 2023-07-16 DIAGNOSIS — R7303 Prediabetes: Secondary | ICD-10-CM | POA: Diagnosis not present

## 2023-07-16 DIAGNOSIS — K219 Gastro-esophageal reflux disease without esophagitis: Secondary | ICD-10-CM | POA: Diagnosis not present

## 2023-07-16 DIAGNOSIS — F324 Major depressive disorder, single episode, in partial remission: Secondary | ICD-10-CM | POA: Diagnosis not present

## 2023-07-16 DIAGNOSIS — I6930 Unspecified sequelae of cerebral infarction: Secondary | ICD-10-CM | POA: Diagnosis not present

## 2023-07-16 DIAGNOSIS — G4730 Sleep apnea, unspecified: Secondary | ICD-10-CM | POA: Diagnosis not present

## 2023-07-16 DIAGNOSIS — E7800 Pure hypercholesterolemia, unspecified: Secondary | ICD-10-CM | POA: Diagnosis not present

## 2023-07-17 ENCOUNTER — Other Ambulatory Visit (HOSPITAL_COMMUNITY): Payer: Self-pay

## 2023-07-17 MED ORDER — CLOPIDOGREL BISULFATE 75 MG PO TABS
75.0000 mg | ORAL_TABLET | Freq: Every day | ORAL | 1 refills | Status: DC
  Filled 2023-07-17: qty 90, 90d supply, fill #0
  Filled 2023-10-10: qty 90, 90d supply, fill #1

## 2023-07-17 MED ORDER — PANTOPRAZOLE SODIUM 40 MG PO TBEC
40.0000 mg | DELAYED_RELEASE_TABLET | Freq: Every day | ORAL | 1 refills | Status: DC
  Filled 2023-07-17: qty 90, 90d supply, fill #0
  Filled 2023-10-10: qty 90, 90d supply, fill #1

## 2023-07-17 MED ORDER — CITALOPRAM HYDROBROMIDE 20 MG PO TABS
20.0000 mg | ORAL_TABLET | Freq: Every day | ORAL | 1 refills | Status: DC
  Filled 2023-07-17: qty 90, 90d supply, fill #0
  Filled 2023-10-10: qty 90, 90d supply, fill #1

## 2023-07-17 MED ORDER — ATORVASTATIN CALCIUM 10 MG PO TABS
10.0000 mg | ORAL_TABLET | Freq: Every day | ORAL | 1 refills | Status: DC
  Filled 2023-07-17: qty 90, 90d supply, fill #0
  Filled 2023-10-10: qty 90, 90d supply, fill #1

## 2023-07-17 MED ORDER — RAMIPRIL 10 MG PO CAPS
10.0000 mg | ORAL_CAPSULE | Freq: Every day | ORAL | 1 refills | Status: DC
  Filled 2023-07-17: qty 90, 90d supply, fill #0
  Filled 2023-10-10: qty 90, 90d supply, fill #1

## 2023-09-11 ENCOUNTER — Ambulatory Visit: Payer: 59 | Admitting: Adult Health

## 2023-10-11 ENCOUNTER — Other Ambulatory Visit (HOSPITAL_COMMUNITY): Payer: Self-pay

## 2023-10-21 ENCOUNTER — Other Ambulatory Visit: Payer: Self-pay

## 2023-11-19 ENCOUNTER — Ambulatory Visit (INDEPENDENT_AMBULATORY_CARE_PROVIDER_SITE_OTHER): Payer: 59

## 2023-11-19 ENCOUNTER — Ambulatory Visit
Admission: EM | Admit: 2023-11-19 | Discharge: 2023-11-19 | Disposition: A | Discharge status: Home / Self Care | Payer: 59 | Attending: Physician Assistant | Admitting: Physician Assistant

## 2023-11-19 DIAGNOSIS — R059 Cough, unspecified: Secondary | ICD-10-CM | POA: Diagnosis not present

## 2023-11-19 DIAGNOSIS — R918 Other nonspecific abnormal finding of lung field: Secondary | ICD-10-CM | POA: Diagnosis not present

## 2023-11-19 DIAGNOSIS — J209 Acute bronchitis, unspecified: Secondary | ICD-10-CM

## 2023-11-19 DIAGNOSIS — R7303 Prediabetes: Secondary | ICD-10-CM | POA: Insufficient documentation

## 2023-11-19 DIAGNOSIS — R0989 Other specified symptoms and signs involving the circulatory and respiratory systems: Secondary | ICD-10-CM | POA: Diagnosis not present

## 2023-11-19 LAB — POCT INFLUENZA A/B
Influenza A, POC: NEGATIVE
Influenza B, POC: NEGATIVE

## 2023-11-19 MED ORDER — PREDNISONE 20 MG PO TABS: 40.0000 mg | ORAL_TABLET | Freq: Every day | ORAL | 0 refills | Status: AC

## 2023-11-19 MED ORDER — ALBUTEROL SULFATE HFA 108 (90 BASE) MCG/ACT IN AERS: 1.0000 | INHALATION_SPRAY | Freq: Four times a day (QID) | RESPIRATORY_TRACT | 0 refills | Status: AC | PRN

## 2023-11-19 MED ORDER — AZITHROMYCIN 250 MG PO TABS: 250.0000 mg | ORAL_TABLET | Freq: Every day | ORAL | 0 refills | Status: DC

## 2023-11-19 MED ORDER — PREDNISONE 20 MG PO TABS: 40.0000 mg | ORAL_TABLET | Freq: Every day | ORAL | 0 refills | Status: DC

## 2023-11-19 MED ORDER — IPRATROPIUM-ALBUTEROL 0.5-2.5 (3) MG/3ML IN SOLN
3.0000 mL | Freq: Once | RESPIRATORY_TRACT | Status: AC
  Administered 2023-11-19: 3 mL via RESPIRATORY_TRACT

## 2023-11-19 MED ORDER — AZITHROMYCIN 250 MG PO TABS: 250.0000 mg | ORAL_TABLET | Freq: Every day | ORAL | 0 refills | Status: AC

## 2023-11-19 NOTE — ED Triage Notes (Signed)
Pain in right lower side too. No injury. ? With breathing.

## 2023-11-19 NOTE — ED Triage Notes (Signed)
"  I have had a Cough with Wheezing and SOB for 3 days". No history of asthma or COPD. No fever noticed.

## 2023-11-19 NOTE — ED Provider Notes (Signed)
[facility]    CSN: [id number] Arrival date & time: 11/19/23  1615      History   Chief Complaint Chief Complaint  Patient presents with   Cough   Wheezing    HPI Johnathan [name] is a 72 y.o. male.   Patient here today for evaluation of cough and wheezing that he has had for 3 days.  He reports some shortness of breath as well.  There is no history of asthma or COPD.  Patient improved  The history is provided by the patient.  Cough Associated symptoms: chills, sore throat and wheezing   Associated symptoms: no ear pain, no eye discharge, no fever and no shortness of breath   Wheezing Associated symptoms: cough and sore throat   Associated symptoms: no ear pain, no fever and no shortness of breath     Past Medical History:  Diagnosis Date   Depression    GERD (gastroesophageal reflux disease)    HTN (hypertension)    Hyperlipidemia    Insomnia    Stroke [facility])     Patient Active Problem List   Diagnosis Date Noted   Prediabetes 11/19/2023   Medial knee pain, left 10/08/2016   Alteration of sensation as late effect of stroke 09/10/2016   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) 06/11/2016   Left-sided weakness 05/16/2016   Right Carotid artery occlusion with infarction (HCC) 05/16/2016   Essential hypertension 05/16/2016   Hyperlipidemia LDL goal <70 05/16/2016   Cigarette smoker 05/16/2016   Overweight (BMI 25.0-29.9) 05/16/2016   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 05/16/2016   Acute respiratory failure (HCC) 05/16/2016   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   Gait disturbance, post-stroke    Hypokalemia    Hypophosphatemia    Cerebrovascular accident (CVA) due to thrombosis of right carotid artery (HCC) s/p IV tPA, R ICA stent placement, thrombectomy     Past Surgical History:  Procedure Laterality Date   IR GENERIC HISTORICAL  09/17/2016   IR  RADIOLOGIST EVAL & MGMT 09/17/2016 [facility]-INTERV RAD   knee arthoscopy     LEG SURGERY     RADIOLOGY WITH ANESTHESIA N/A 05/12/2016   Procedure: RADIOLOGY WITH ANESTHESIA;  Surgeon: Julieanne Cotton, MD;  Location: [facility] OR;  Service: Radiology;  Laterality: N/A;       Home Medications    Prior to Admission medications   Medication Sig Start Date End Date Taking? Authorizing Provider  albuterol (VENTOLIN HFA) 108 (90 Base) MCG/ACT inhaler Inhale 1-2 puffs into the lungs every 6 (six) hours as needed for wheezing or shortness of breath. 11/19/23  Yes Tomi Bamberger, PA-C  atorvastatin (LIPITOR) 10 MG tablet Take 1 tablet (10 mg total) by mouth daily. 07/16/23     azithromycin (ZITHROMAX) 250 MG tablet Take 1 tablet (250 mg total) by mouth daily. Take first 2 tablets together, then 1 every day until finished. 11/19/23   Tomi Bamberger, PA-C  citalopram (CELEXA) 20 MG tablet Take 1 tablet (20 mg total) by mouth daily. 07/16/23     clopidogrel (PLAVIX) 75 MG tablet Take 1 tablet (75 mg total) by mouth daily. 07/16/23     pantoprazole (PROTONIX) 40 MG tablet Take 1 tablet (40 mg total) by mouth daily. 07/16/23     predniSONE (DELTASONE) 20 MG tablet Take 2 tablets (40 mg total) by mouth daily with breakfast for 5 days. 11/19/23 11/24/23  Tomi Bamberger, PA-C  ramipril (  ALTACE) 10 MG capsule Take 1 capsule (10 mg total) by mouth daily. 07/16/23     ramipril (ALTACE) 5 MG capsule Take 1 capsule (5 mg total) by mouth daily. Patient taking differently: Take 10 mg by mouth daily. 01/15/23       Family History Family History  Problem Relation Age of Onset   Dementia Mother 89   CVA Mother    Hypertension Mother    CVA Maternal Grandmother     Social History Social History   Tobacco Use   Smoking status: Some Days    Current packs/day: 0.00    Types: Cigarettes    Last attempt to quit: 05/21/2016    Years since quitting: 7.5   Smokeless tobacco: Never  Vaping Use   Vaping status: Never Used   Substance Use Topics   Alcohol use: No   Drug use: No     Allergies   Patient has no known allergies.   Review of Systems Review of Systems  Constitutional:  Positive for chills. Negative for fever.  HENT:  Positive for congestion and sore throat. Negative for ear pain.   Eyes:  Negative for discharge and redness.  Respiratory:  Positive for cough and wheezing. Negative for shortness of breath.   Gastrointestinal:  Negative for abdominal pain, nausea and vomiting.     Physical Exam Triage Vital Signs ED Triage Vitals  Encounter Vitals Group     BP 11/19/23 1726 126/73     Systolic BP Percentile --      Diastolic BP Percentile --      Pulse Rate 11/19/23 1716 80     Resp 11/19/23 1716 (!) 30     Temp 11/19/23 1716 99 F (37.2 C)     Temp Source 11/19/23 1716 Oral     SpO2 11/19/23 1716 93 %     Weight 11/19/23 1715 181 lb 7 oz (82.3 kg)     Height 11/19/23 1715 5\' 4"  (1.626 m)     Head Circumference --      Peak Flow --      Pain Score 11/19/23 1714 0     Pain Loc --      Pain Education --      Exclude from Growth Chart --    No data found.  Updated Vital Signs BP 126/73 (BP Location: Left Arm)   Pulse 80   Temp 99 F (37.2 C) (Oral)   Resp (!) 26   Ht 5\' 4"  (1.626 m)   Wt 181 lb 7 oz (82.3 kg)   SpO2 95%   BMI 31.14 kg/m   Visual Acuity Right Eye Distance:   Left Eye Distance:   Bilateral Distance:    Right Eye Near:   Left Eye Near:    Bilateral Near:     Physical Exam Vitals and nursing note reviewed.  Constitutional:      General: He is not in acute distress.    Appearance: Normal appearance. He is not ill-appearing.  HENT:     Head: Normocephalic and atraumatic.     Nose: Congestion present.     Mouth/Throat:     Mouth: Mucous membranes are moist.     Pharynx: Oropharynx is clear. No oropharyngeal exudate or posterior oropharyngeal erythema.  Eyes:     Conjunctiva/sclera: Conjunctivae normal.  Cardiovascular:     Rate and Rhythm:  Normal rate and regular rhythm.     Heart sounds: Normal heart sounds. No murmur heard. Pulmonary:  Effort: Pulmonary effort is normal. No respiratory distress.     Breath sounds: Wheezing present. No rhonchi or rales.  Skin:    General: Skin is warm and dry.  Neurological:     Mental Status: He is alert.  Psychiatric:        Mood and Affect: Mood normal.        Thought Content: Thought content normal.      UC Treatments / Results  Labs (all labs ordered are listed, but only abnormal results are displayed) Labs Reviewed  POCT INFLUENZA A/B - Normal  SARS CORONAVIRUS 2 (TAT 6-24 HRS)    EKG   Radiology No results found.  Procedures Procedures (including critical care time)  Medications Ordered in UC Medications  ipratropium-albuterol (DUONEB) 0.5-2.5 (3) MG/3ML nebulizer solution 3 mL (3 mLs Nebulization Given 11/19/23 1725)    Initial Impression / Assessment and Plan / UC Course  I have reviewed the triage vital signs and the nursing notes.  Pertinent labs & imaging results that were available during my care of the patient were reviewed by me and considered in my medical decision making (see chart for details).    DuoNeb administered in office.  Patient had improvement with same.  Suspect likely COPD that has not been diagnosed.  Will treat to cover bronchitis vs COPD exacerbation, and recommended follow-up with PCP if no gradual improvement or ED with any worsening symptoms.  Final Clinical Impressions(s) / UC Diagnoses   Final diagnoses:  Acute bronchitis, unspecified organism   Discharge Instructions   None    ED Prescriptions     Medication Sig Dispense Auth. Provider   predniSONE (DELTASONE) 20 MG tablet  (Status: Discontinued) Take 2 tablets (40 mg total) by mouth daily with breakfast for 5 days. 10 tablet Erma Pinto F, PA-C   azithromycin (ZITHROMAX) 250 MG tablet  (Status: Discontinued) Take 1 tablet (250 mg total) by mouth daily. Take first 2  tablets together, then 1 every day until finished. 6 tablet Erma Pinto F, PA-C   azithromycin (ZITHROMAX) 250 MG tablet Take 1 tablet (250 mg total) by mouth daily. Take first 2 tablets together, then 1 every day until finished. 6 tablet Erma Pinto F, PA-C   predniSONE (DELTASONE) 20 MG tablet Take 2 tablets (40 mg total) by mouth daily with breakfast for 5 days. 10 tablet Erma Pinto F, PA-C   albuterol (VENTOLIN HFA) 108 (90 Base) MCG/ACT inhaler Inhale 1-2 puffs into the lungs every 6 (six) hours as needed for wheezing or shortness of breath. 1 each Tomi Bamberger, PA-C      PDMP not reviewed this encounter.   Tomi Bamberger, PA-C 11/24/23 [phone]

## 2023-11-19 NOTE — ED Notes (Addendum)
Patient is resting comfortably. Finished treatment. See V/S.

## 2023-11-20 ENCOUNTER — Ambulatory Visit: Payer: Medicare Other

## 2023-11-20 LAB — SARS CORONAVIRUS 2 (TAT 6-24 HRS): SARS Coronavirus 2: NEGATIVE

## 2024-01-06 ENCOUNTER — Other Ambulatory Visit (HOSPITAL_COMMUNITY): Payer: Self-pay

## 2024-01-08 ENCOUNTER — Other Ambulatory Visit: Payer: Self-pay

## 2024-01-08 ENCOUNTER — Other Ambulatory Visit (HOSPITAL_COMMUNITY): Payer: Self-pay

## 2024-01-08 MED ORDER — CITALOPRAM HYDROBROMIDE 20 MG PO TABS
20.0000 mg | ORAL_TABLET | Freq: Every day | ORAL | 0 refills | Status: DC
  Filled 2024-01-08: qty 90, 90d supply, fill #0

## 2024-01-08 MED ORDER — CLOPIDOGREL BISULFATE 75 MG PO TABS
75.0000 mg | ORAL_TABLET | Freq: Every day | ORAL | 0 refills | Status: DC
  Filled 2024-01-08: qty 90, 90d supply, fill #0

## 2024-01-08 MED ORDER — PANTOPRAZOLE SODIUM 40 MG PO TBEC
40.0000 mg | DELAYED_RELEASE_TABLET | Freq: Every day | ORAL | 0 refills | Status: DC
  Filled 2024-01-08: qty 90, 90d supply, fill #0

## 2024-01-08 MED ORDER — RAMIPRIL 10 MG PO CAPS
10.0000 mg | ORAL_CAPSULE | Freq: Every day | ORAL | 0 refills | Status: DC
  Filled 2024-01-08: qty 90, 90d supply, fill #0

## 2024-01-08 MED ORDER — ATORVASTATIN CALCIUM 10 MG PO TABS
10.0000 mg | ORAL_TABLET | Freq: Every day | ORAL | 0 refills | Status: DC
  Filled 2024-01-08: qty 90, 90d supply, fill #0

## 2024-01-20 ENCOUNTER — Ambulatory Visit
Admission: RE | Admit: 2024-01-20 | Discharge: 2024-01-20 | Disposition: A | Discharge status: Home / Self Care | Payer: Medicare Other | Source: Ambulatory Visit

## 2024-01-20 VITALS — BP 134/80 | HR 77 | Temp 97.9°F | Resp 16

## 2024-01-20 DIAGNOSIS — S39012A Strain of muscle, fascia and tendon of lower back, initial encounter: Secondary | ICD-10-CM | POA: Diagnosis not present

## 2024-01-20 DIAGNOSIS — R109 Unspecified abdominal pain: Secondary | ICD-10-CM

## 2024-01-20 LAB — POCT URINALYSIS DIP (MANUAL ENTRY)
Bilirubin, UA: NEGATIVE
Glucose, UA: NEGATIVE mg/dL
Ketones, POC UA: NEGATIVE mg/dL
Leukocytes, UA: NEGATIVE
Nitrite, UA: NEGATIVE
Spec Grav, UA: >=1.03 — AB
Urobilinogen, UA: 0.2 U/dL
pH, UA: 6

## 2024-01-20 MED ORDER — CYCLOBENZAPRINE HCL 5 MG PO TABS: 5.0000 mg | ORAL_TABLET | Freq: Every day | ORAL | 0 refills | Status: AC

## 2024-01-20 MED ORDER — PREDNISONE 20 MG PO TABS: 20.0000 mg | ORAL_TABLET | Freq: Two times a day (BID) | ORAL | 0 refills | Status: AC

## 2024-01-20 NOTE — ED Triage Notes (Addendum)
Pt reports R flank pain x2 weeks but severity of pain has increased over last several days. Notes increased urinary frequency during this period per spouse. No other changes noted with urination. Reports stabbing pain that is aggravated with movement. No hx of kidney stones.

## 2024-01-20 NOTE — ED Provider Notes (Signed)
[facility]    CSN: [id number] Arrival date & time: 01/20/24  1841      History   Chief Complaint Chief Complaint  Patient presents with   Flank Pain    Right side flank pain comes and goes for  several weeks, but is constant stabbing pain now. - Entered by patient    HPI Johnathan [name] is a 73 y.o. male.  Patient is here with 2-3 weeks of right flank pain. Patient denies any specific injury however his wife reports that he has been lifting heavy wood logs and splitting the wood. Patient wife recalls patient began to complain of mid right back pain around that time and he has continued to split wood over the last 2 weeks.  Reports over the last 2 weeks the pain has become more intense and he feels a localized stabbing sensation at the right mid flank area.  He is not having any dysuria has no history of a kidney stone.  Orts pain is worse with certain movements, sitting and laying on his right side. He denies fever, nausea, or vomiting. He has not taken any medication for pain.    Past Medical History:  Diagnosis Date   Depression    GERD (gastroesophageal reflux disease)    HTN (hypertension)    Hyperlipidemia    Insomnia    Stroke 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 05/16/2016   Acute respiratory failure (HCC) 05/16/2016   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   Gait disturbance,  post-stroke    Hypokalemia    Hypophosphatemia    Cerebrovascular accident (CVA) due to thrombosis of right carotid artery (HCC) s/p IV tPA, R ICA stent placement, thrombectomy     Past Surgical History:  Procedure Laterality Date   IR GENERIC HISTORICAL  09/17/2016   IR RADIOLOGIST EVAL & MGMT 09/17/2016 [facility]INTER[facility] RAD   knee arthoscopy     LEG SURGERY     RADIOLOGY WITH ANESTHESIA N/A 05/12/2016   Procedure: RADIOLOGY WITH ANESTHESIA;  Surgeon: Julieanne Cotton, MD;  Location: [facility] OR;  Service: Radiology;  Laterality: N/A;       Home Medications    Prior to Admission medications   Medication Sig Start Date End Date Taking? Authorizing Provider  atorvastatin (LIPITOR) 10 MG tablet 1 tablet Orally Once a day for 90 days   Yes [provider]  citalopram (CELEXA) 20 MG tablet Take 1 tablet (20 mg total) by mouth daily. 01/07/24  Yes   clopidogrel (PLAVIX) 75 MG tablet Take 1 tablet (75 mg total) by mouth daily. 01/07/24  Yes   cyclobenzaprine (FLEXERIL) 5 MG tablet Take 1 tablet (5 mg total) by mouth at bedtime. 01/20/24  Yes Bing Neighbors, NP  pantoprazole (PROTONIX) 40 MG tablet 1 tablet Orally once a day for 90 days   Yes [provider]  predniSONE (DELTASONE) 20 MG tablet Take 1 tablet (20 mg total) by mouth 2 (two)  times daily with a meal for 5 days. 01/20/24 01/25/24 Yes Bing Neighbors, NP  ramipril (ALTACE) 10 MG capsule 1 capsule Orally Once a day for 90 days   Yes [provider]  albuterol (VENTOLIN HFA) 108 (90 Base) MCG/ACT inhaler Inhale 1-2 puffs into the lungs every 6 (six) hours as needed for wheezing or shortness of breath. 11/19/23   Tomi Bamberger, PA-C  atorvastatin (LIPITOR) 10 MG tablet Take 1 tablet (10 mg total) by mouth daily. 01/07/24     azithromycin (ZITHROMAX) 250 MG tablet Take 1 tablet (250 mg total) by mouth daily. Take first 2 tablets together, then 1 every day until finished. 11/19/23   Tomi Bamberger, PA-C  citalopram  (CELEXA) 20 MG tablet 1 tablet Orally Once a day for 90 days    [provider]  clopidogrel (PLAVIX) 75 MG tablet 1 tablet Orally Once a day for 90 days    [provider]  pantoprazole (PROTONIX) 40 MG tablet Take 1 tablet (40 mg total) by mouth daily. 01/07/24     ramipril (ALTACE) 10 MG capsule Take 1 capsule (10 mg total) by mouth daily. 01/07/24     ramipril (ALTACE) 5 MG capsule Take 1 capsule (5 mg total) by mouth daily. Patient not taking: Reported on 01/20/2024 01/15/23       Family History Family History  Problem Relation Age of Onset   Dementia Mother 41   CVA Mother    Hypertension Mother    CVA Maternal Grandmother     Social History Social History   Tobacco Use   Smoking status: Every Day    Current packs/day: 0.00    Types: Cigarettes    Last attempt to quit: 05/21/2016    Years since quitting: 7.6   Smokeless tobacco: Never  Vaping Use   Vaping status: Never Used  Substance Use Topics   Alcohol use: No   Drug use: No     Allergies   Patient has no known allergies.   Review of Systems Review of Systems  Genitourinary:  Positive for flank pain.     Physical Exam Triage Vital Signs ED Triage Vitals  Encounter Vitals Group     BP 01/20/24 1906 134/80     Systolic BP Percentile --      Diastolic BP Percentile --      Pulse Rate 01/20/24 1906 77     Resp 01/20/24 1906 16     Temp 01/20/24 1906 97.9 F (36.6 C)     Temp Source 01/20/24 1906 Oral     SpO2 01/20/24 1906 95 %     Weight --      Height --      Head Circumference --      Peak Flow --      Pain Score 01/20/24 1907 6     Pain Loc --      Pain Education --      Exclude from Growth Chart --    No data found.  Updated Vital Signs BP 134/80 (BP Location: Left Arm)   Pulse 77   Temp 97.9 F (36.6 C) (Oral)   Resp 16   SpO2 95%   Visual Acuity Right Eye Distance:   Left Eye Distance:   Bilateral Distance:    Right Eye Near:   Left Eye Near:    Bilateral  Near:     Physical Exam HENT:     Head: Normocephalic and atraumatic.  Cardiovascular:  Rate and Rhythm: Normal rate and regular rhythm.  Pulmonary:     Effort: Pulmonary effort is normal.     Breath sounds: Normal breath sounds.  Musculoskeletal:       Arms:  Skin:    General: Skin is warm and dry.  Neurological:     General: No focal deficit present.     Mental Status: He is alert and oriented to person, place, and time.      UC Treatments / Results  Labs (all labs ordered are listed, but only abnormal results are displayed) Labs Reviewed  POCT URINALYSIS DIP (MANUAL ENTRY) - Abnormal; Notable for the following components:      Result Value   Color, UA straw (*)    Spec Grav, UA >=1.030 (*)    Blood, UA small (*)    Protein Ur, POC trace (*)    All other components within normal limits    EKG   Radiology No results found.  Procedures Procedures (including critical care time)  Medications Ordered in UC Medications - No data to display  Initial Impression / Assessment and Plan / UC Course  I have reviewed the triage vital signs and the nursing notes.  Pertinent labs & imaging results that were available during my care of the patient were reviewed by me and considered in my medical decision making (see chart for details).      UA evident for a trace of protein and small amount of microscopic blood.Negative for any dysuria symptoms.  Negative for significant CVA tenderness low suspicion for a renal stone.  Given history of lifting heavy wood suspect he has a muscle strain or possibly a fractured rib given the localized area of tenderness.  Obtaining a chest x-ray two-view to rule out any rib fracture.  Treating for inflammation with prednisone and cyclobenzaprine to help with pain and help facilitate sleep.  Return precautions given.  Patient and wife advised that we will notify him of the chest x-ray results as this will be obtained from one of the outpatient  imaging centers as there is no x-ray onsite today.  Patient and  wife verbalized understanding and agreement with plan Final Clinical Impressions(s) / UC Diagnoses   Final diagnoses:  Acute right flank pain  Strain of lumbar region, initial encounter     Discharge Instructions      Go to outpatient imaging at [facility] on [address].  When your x-ray has been read by the radiologist the results will come directly to me.  I am concerned either have a fractured rib or muscle strain that is causing the sharp pain in your lower right back.  Prescribed you prednisone start 20 mg twice daily today or tomorrow and take for a total of 5 days.  For acute pain I have prescribed you cyclobenzaprine which is a muscle relaxer take only at bedtime as it can cause severe drowsiness.  Make sure you are drinking plenty of water.  As discussed you had a small amount of blood in your urine however given exam findings low suspicion for kidney stone however at any point your pain is worsening or becoming more severe and with the treatment that I have prescribed I would recommend evaluation in the emergency department.     ED Prescriptions     Medication Sig Dispense Auth. Provider   predniSONE (DELTASONE) 20 MG tablet Take 1 tablet (20 mg total) by mouth 2 (two) times daily with a meal for 5 days. 10 tablet Johnathan [name],  Johnathan Pick, NP   cyclobenzaprine (FLEXERIL) 5 MG tablet Take 1 tablet (5 mg total) by mouth at bedtime. 20 tablet Bing Neighbors, NP      PDMP not reviewed this encounter.   Bing Neighbors, NP 01/21/24 [phone]

## 2024-01-20 NOTE — Discharge Instructions (Addendum)
Go to outpatient imaging at [facility] on [address].  When your x-ray has been read by the radiologist the results will come directly to me.  I am concerned either have a fractured rib or muscle strain that is causing the sharp pain in your lower right back.  Prescribed you prednisone start 20 mg twice daily today or tomorrow and take for a total of 5 days.  For acute pain I have prescribed you cyclobenzaprine which is a muscle relaxer take only at bedtime as it can cause severe drowsiness.  Make sure you are drinking plenty of water.  As discussed you had a small amount of blood in your urine however given exam findings low suspicion for kidney stone however at any point your pain is worsening or becoming more severe and with the treatment that I have prescribed I would recommend evaluation in the emergency department.

## 2024-01-21 ENCOUNTER — Ambulatory Visit
Admission: RE | Admit: 2024-01-21 | Discharge: 2024-01-21 | Disposition: A | Discharge status: Home / Self Care | Payer: Medicare Other | Attending: Family Medicine | Admitting: Family Medicine

## 2024-01-21 ENCOUNTER — Encounter: Payer: Self-pay | Admitting: Family Medicine

## 2024-01-21 ENCOUNTER — Ambulatory Visit
Admission: RE | Admit: 2024-01-21 | Discharge: 2024-01-21 | Disposition: A | Discharge status: Home / Self Care | Payer: Medicare Other | Source: Ambulatory Visit | Attending: Family Medicine | Admitting: Family Medicine

## 2024-01-21 DIAGNOSIS — M5489 Other dorsalgia: Secondary | ICD-10-CM | POA: Diagnosis not present

## 2024-01-21 DIAGNOSIS — M545 Low back pain, unspecified: Secondary | ICD-10-CM | POA: Diagnosis not present

## 2024-01-28 ENCOUNTER — Other Ambulatory Visit: Payer: Self-pay

## 2024-01-28 ENCOUNTER — Encounter (HOSPITAL_BASED_OUTPATIENT_CLINIC_OR_DEPARTMENT_OTHER): Payer: Self-pay | Admitting: Emergency Medicine

## 2024-01-28 ENCOUNTER — Emergency Department (HOSPITAL_BASED_OUTPATIENT_CLINIC_OR_DEPARTMENT_OTHER)
Admission: EM | Admit: 2024-01-28 | Discharge: 2024-01-28 | Disposition: A | Discharge status: Home / Self Care | Payer: Medicare Other | Attending: Emergency Medicine | Admitting: Emergency Medicine

## 2024-01-28 ENCOUNTER — Emergency Department (HOSPITAL_BASED_OUTPATIENT_CLINIC_OR_DEPARTMENT_OTHER): Payer: Medicare Other

## 2024-01-28 DIAGNOSIS — I10 Essential (primary) hypertension: Secondary | ICD-10-CM | POA: Diagnosis not present

## 2024-01-28 DIAGNOSIS — R109 Unspecified abdominal pain: Secondary | ICD-10-CM | POA: Diagnosis not present

## 2024-01-28 DIAGNOSIS — Z7901 Long term (current) use of anticoagulants: Secondary | ICD-10-CM | POA: Diagnosis not present

## 2024-01-28 DIAGNOSIS — Z8673 Personal history of transient ischemic attack (TIA), and cerebral infarction without residual deficits: Secondary | ICD-10-CM | POA: Diagnosis not present

## 2024-01-28 DIAGNOSIS — N200 Calculus of kidney: Secondary | ICD-10-CM | POA: Insufficient documentation

## 2024-01-28 DIAGNOSIS — R1084 Generalized abdominal pain: Secondary | ICD-10-CM | POA: Diagnosis present

## 2024-01-28 DIAGNOSIS — N400 Enlarged prostate without lower urinary tract symptoms: Secondary | ICD-10-CM | POA: Diagnosis not present

## 2024-01-28 LAB — COMPREHENSIVE METABOLIC PANEL
ALT: 29 U/L (ref 0–44)
AST: 16 U/L (ref 15–41)
Albumin: 4.3 g/dL (ref 3.5–5.0)
Alkaline Phosphatase: 70 U/L (ref 38–126)
Anion gap: 8 (ref 5–15)
BUN: 19 mg/dL (ref 8–23)
CO2: 28 mmol/L (ref 22–32)
Calcium: 8.8 mg/dL — ABNORMAL LOW (ref 8.9–10.3)
Chloride: 104 mmol/L (ref 98–111)
Creatinine, Ser: 1.06 mg/dL (ref 0.61–1.24)
GFR, Estimated: >60 mL/min (ref >60)
Glucose, Bld: 102 mg/dL — ABNORMAL HIGH (ref 70–99)
Potassium: 4 mmol/L (ref 3.5–5.1)
Sodium: 140 mmol/L (ref 135–145)
Total Bilirubin: 0.6 mg/dL (ref 0.0–1.2)
Total Protein: 7 g/dL (ref 6.5–8.1)

## 2024-01-28 LAB — URINALYSIS, ROUTINE W REFLEX MICROSCOPIC
Bacteria, UA: NONE SEEN
Bilirubin Urine: NEGATIVE
Glucose, UA: NEGATIVE mg/dL
Ketones, ur: NEGATIVE mg/dL
Leukocytes,Ua: NEGATIVE
Nitrite: NEGATIVE
Specific Gravity, Urine: 1.029 (ref 1.005–1.030)
pH: 6 (ref 5.0–8.0)

## 2024-01-28 LAB — CBC
HCT: 45.6 % (ref 39.0–52.0)
Hemoglobin: 15.4 g/dL (ref 13.0–17.0)
MCH: 33.3 pg (ref 26.0–34.0)
MCHC: 33.8 g/dL (ref 30.0–36.0)
MCV: 98.7 fL (ref 80.0–100.0)
Platelets: 253 10*3/uL (ref 150–400)
RBC: 4.62 MIL/uL (ref 4.22–5.81)
RDW: 13.4 % (ref 11.5–15.5)
WBC: 13.3 10*3/uL — ABNORMAL HIGH (ref 4.0–10.5)
nRBC: 0 % (ref 0.0–0.2)

## 2024-01-28 LAB — LIPASE, BLOOD: Lipase: 42 U/L (ref 11–51)

## 2024-01-28 MED ORDER — OXYCODONE HCL 5 MG PO TABS: 5.0000 mg | ORAL_TABLET | ORAL | 0 refills | Status: AC | PRN

## 2024-01-28 MED ORDER — IOHEXOL 300 MG/ML  SOLN
100.0000 mL | Freq: Once | INTRAMUSCULAR | Status: AC | PRN
  Administered 2024-01-28: 100 mL via INTRAVENOUS

## 2024-01-28 MED ORDER — KETOROLAC TROMETHAMINE 15 MG/ML IJ SOLN
15.0000 mg | Freq: Once | INTRAMUSCULAR | Status: AC
  Administered 2024-01-28: 15 mg via INTRAVENOUS
  Filled 2024-01-28: qty 1

## 2024-01-28 MED ORDER — ONDANSETRON HCL 4 MG PO TABS: 4.0000 mg | ORAL_TABLET | Freq: Four times a day (QID) | ORAL | 0 refills | Status: AC

## 2024-01-28 MED ORDER — TAMSULOSIN HCL 0.4 MG PO CAPS
0.4000 mg | ORAL_CAPSULE | Freq: Every day | ORAL | 0 refills | Status: AC
Start: 2024-01-28 — End: ?

## 2024-01-28 NOTE — ED Triage Notes (Signed)
 Right flank x 2 week Getting worse  Seen at [facility] for same last week

## 2024-01-28 NOTE — Discharge Instructions (Addendum)
 You were evaluated in the emergency room for abdominal pain and flank pain.  You were found to have a kidney stone.  This is anticipated to pass on its own.  Prescription for Flomax was sent into your pharmacy to help facilitate the process.  Additionally sending in oxycodone for pain and Zofran for nausea.  Please keep in mind oxycodone can cause nausea and drowsiness.  Please avoid driving or operating heavy machinery while using this medication.  I recommended alternating Tylenol 1000 mg every 4-6 hours up to 3 times a day and/or ibuprofen 600 mg every 4-6 hours up to 3 times a day as well for her pain.  You have been provided a urology contact.  Please call make an appointment within the next week or two.  If you experience any new or worsening symptoms including fevers, chills persistent vomiting please return to the emergency room.

## 2024-01-28 NOTE — ED Provider Notes (Signed)
 [place] EMERGENCY DEPARTMENT AT [facility] Provider Note   CSN: [id number] Arrival date & time: 01/28/24  1526     History  Chief Complaint  Patient presents with   Flank Pain    Johnathan [name] is a 73 y.o. male who presents with complaints of right flank pain x 2 weeks.  Denies any injury or trauma.  No urinary symptoms.  Denies any vomiting or diarrhea.  No hematuria, hematochezia or melena.  No prior abdominal surgery.  He is still having bowel movements.  Notes no correlation between food and symptoms.  Denies any fevers or chills.  Pain is worse with movement.  Pain is described as sharp.   Flank Pain      Past Medical History:  Diagnosis Date   Depression    GERD (gastroesophageal reflux disease)    HTN (hypertension)    Hyperlipidemia    Insomnia    Stroke (HCC)      Home Medications Prior to Admission medications   Medication Sig Start Date End Date Taking? Authorizing Provider  tamsulosin (FLOMAX) 0.4 MG CAPS capsule Take 1 capsule (0.4 mg total) by mouth daily. This should be taken until you pass the stone. 01/28/24  Yes Halford Decamp, PA-C  albuterol (VENTOLIN HFA) 108 (90 Base) MCG/ACT inhaler Inhale 1-2 puffs into the lungs every 6 (six) hours as needed for wheezing or shortness of breath. 11/19/23   Tomi Bamberger, PA-C  atorvastatin (LIPITOR) 10 MG tablet Take 1 tablet (10 mg total) by mouth daily. 01/07/24     atorvastatin (LIPITOR) 10 MG tablet 1 tablet Orally Once a day for 90 days    [provider]  azithromycin (ZITHROMAX) 250 MG tablet Take 1 tablet (250 mg total) by mouth daily. Take first 2 tablets together, then 1 every day until finished. 11/19/23   Tomi Bamberger, PA-C  citalopram (CELEXA) 20 MG tablet Take 1 tablet (20 mg total) by mouth daily. 01/07/24     citalopram (CELEXA) 20 MG tablet 1 tablet Orally Once a day for 90 days    [provider]  clopidogrel (PLAVIX) 75 MG tablet Take 1 tablet (75 mg total) by  mouth daily. 01/07/24     clopidogrel (PLAVIX) 75 MG tablet 1 tablet Orally Once a day for 90 days    [provider]  cyclobenzaprine (FLEXERIL) 5 MG tablet Take 1 tablet (5 mg total) by mouth at bedtime. 01/20/24   Bing Neighbors, NP  ondansetron (ZOFRAN) 4 MG tablet Take 1 tablet (4 mg total) by mouth every 6 (six) hours. 01/28/24  Yes Halford Decamp, PA-C  oxyCODONE (ROXICODONE) 5 MG immediate release tablet Take 1 tablet (5 mg total) by mouth every 4 (four) hours as needed for severe pain (pain score 7-10). 01/28/24  Yes Halford Decamp, PA-C  pantoprazole (PROTONIX) 40 MG tablet Take 1 tablet (40 mg total) by mouth daily. 01/07/24     pantoprazole (PROTONIX) 40 MG tablet 1 tablet Orally once a day for 90 days    [provider]  ramipril (ALTACE) 10 MG capsule Take 1 capsule (10 mg total) by mouth daily. 01/07/24     ramipril (ALTACE) 10 MG capsule 1 capsule Orally Once a day for 90 days    [provider]  ramipril (ALTACE) 5 MG capsule Take 1 capsule (5 mg total) by mouth daily. Patient not taking: Reported on 01/20/2024 01/15/23         Allergies  Patient has no known allergies.    Review of Systems   Review of Systems  Genitourinary:  Positive for flank pain.    Physical Exam Updated Vital Signs BP (!) 165/91   Pulse (!) 59   Temp 98.1 F (36.7 C) (Oral)   Resp 16   SpO2 97%  Physical Exam Vitals and nursing note reviewed.  Constitutional:      General: He is not in acute distress.    Appearance: He is well-developed.  HENT:     Head: Normocephalic and atraumatic.  Eyes:     Conjunctiva/sclera: Conjunctivae normal.  Cardiovascular:     Rate and Rhythm: Normal rate and regular rhythm.     Heart sounds: No murmur heard. Pulmonary:     Effort: Pulmonary effort is normal. No respiratory distress.     Breath sounds: Normal breath sounds.  Abdominal:     Palpations: Abdomen is soft.     Comments: Very mild generalized abdominal  tenderness, no rebound or guarding.  Right CVAT  Musculoskeletal:        General: No swelling.     Cervical back: Neck supple.  Skin:    General: Skin is warm and dry.     Capillary Refill: Capillary refill takes less than 2 seconds.  Neurological:     Mental Status: He is alert.  Psychiatric:        Mood and Affect: Mood normal.     ED Results / Procedures / Treatments   Labs (all labs ordered are listed, but only abnormal results are displayed) Labs Reviewed  COMPREHENSIVE METABOLIC PANEL - Abnormal; Notable for the following components:      Result Value   Glucose, Bld 102 (*)    Calcium 8.8 (*)    All other components within normal limits  CBC - Abnormal; Notable for the following components:   WBC 13.3 (*)    All other components within normal limits  URINALYSIS, ROUTINE W REFLEX MICROSCOPIC - Abnormal; Notable for the following components:   Hgb urine dipstick TRACE (*)    Protein, ur TRACE (*)    All other components within normal limits  LIPASE, BLOOD    EKG None  Radiology CT ABDOMEN PELVIS W CONTRAST Result Date: 01/28/2024 CLINICAL DATA:  Abdominal pain EXAM: CT ABDOMEN AND PELVIS WITH CONTRAST TECHNIQUE: Multidetector CT imaging of the abdomen and pelvis was performed using the standard protocol following bolus administration of intravenous contrast. RADIATION DOSE REDUCTION: This exam was performed according to the departmental dose-optimization program which includes automated exposure control, adjustment of the mA and/or kV according to patient size and/or use of iterative reconstruction technique. CONTRAST:  OMNIPAQUE IOHEXOL 300 MG/ML  SOLN COMPARISON:  12/21/2003 FINDINGS: Lower chest: Dependent atelectasis in the lower lobes. No effusions. Hepatobiliary: No focal hepatic abnormality. Gallbladder unremarkable. Pancreas: No focal abnormality or ductal dilatation. Spleen: No focal abnormality.  Normal size. Adrenals/Urinary Tract: Nonobstructing 5 mm stone  in the lower pole of the right kidney. No ureteral stones or hydronephrosis. Adrenal glands and urinary bladder unremarkable. No suspicious renal abnormality. Stomach/Bowel: Normal appendix. Stomach, large and small bowel grossly unremarkable. Vascular/Lymphatic: Aortoiliac atherosclerosis. No evidence of aneurysm or adenopathy. Reproductive: Prostate enlargement Other: No free fluid or free air. Musculoskeletal: No acute bony abnormality. IMPRESSION: No acute findings in the abdomen or pelvis. Aortic atherosclerosis. Right lower pole nephrolithiasis.  No hydronephrosis. Prostate enlargement. Electronically Signed   By: Charlett Nose M.D.   On: 01/28/2024 19:32    Procedures  Procedures    Medications Ordered in ED Medications  iohexol (OMNIPAQUE) 300 MG/ML solution 100 mL (100 mLs Intravenous Contrast Given 01/28/24 1914)    ED Course/ Medical Decision Making/ A&P                                 Medical Decision Making Amount and/or Complexity of Data Reviewed Labs: ordered. Radiology: ordered.  Risk Prescription drug management.   This patient presents to the ED with chief complaint(s) of flank pain.  The complaint involves an extensive differential diagnosis and also carries with it a high risk of complications and morbidity.   pertinent past medical history as listed in HPI  The differential diagnosis includes  gastroenteritis, colitis, small bowel obstruction, appendicitis, cholecystitis, hepatobiliary pathology, gastritis, PUD, diverticulosis/diverticulitis, pancreatitis, nephrolithiasis,, UTI, pyelonephritis,   The initial plan is to  Start with basic labs Additional history obtained: Additional history obtained from family EMR reviewed Initial Assessment:   Hemodynamically stable, afebrile, nontoxic-appearing patient presenting with right flank pain x 2 weeks.  On exam he has mild generalized abdominal tenderness notably worse to the right flank.  He has positive CVAT.  There  is no overlying lesions to suggest herpes zoster.  No vomiting or diarrhea to suggest gastroenteritis.  He has no urinary symptoms to suggest cystitis or pyelonephritis.  UA does demonstrate some hemoglobin present, without leukocytes or nitrates.  He does have mildly elevated   Independent ECG interpretation:  none  Independent labs interpretation:  The following labs were independently interpreted:  CBC with mild leukocytosis of 13.3, lipase within normal limits, CMP without significant abnormality, UA with trace hemoglobin, negative nitrates and leukocytes  Independent visualization and interpretation of imaging: I independently visualized the following imaging with scope of interpretation limited to determining acute life threatening conditions related to emergency care: CT abdomen pelvis, which revealed 5 mm nonobstructing nephrolithiasis in the lower pole  Treatment and Reassessment: Upon first assessment offered patient something for pain.  He declines at this time.  Upon reassessment patient is sitting comfortably.  Discussed CT findings of kidney stone was discharge plan.  He is agreeable.  Pain is controlled.  Consultations obtained:   none  Disposition:   Patient will be discharged home.  Prescription for Flomax sent to the pharmacy including short course of oxycodone.  Provided with urology follow-up. The patient has been appropriately medically screened and/or stabilized in the ED. I have low suspicion for any other emergent medical condition which would require further screening, evaluation or treatment in the ED or require inpatient management. At time of discharge the patient is hemodynamically stable and in no acute distress. I have discussed work-up results and diagnosis with patient and answered all questions. Patient is agreeable with discharge plan. We discussed strict return precautions for returning to the emergency department and they verbalized understanding.      Social Determinants of Health:   none  This note was dictated with voice recognition software.  Despite best efforts at proofreading, errors may have occurred which can change the documentation meaning.          Final Clinical Impression(s) / ED Diagnoses Final diagnoses:  Nephrolithiasis    Rx / DC Orders ED Discharge Orders          Ordered    oxyCODONE (ROXICODONE) 5 MG immediate release tablet  Every 4 hours PRN        01/28/24 2018  ondansetron (ZOFRAN) 4 MG tablet  Every 6 hours        01/28/24 2018    tamsulosin (FLOMAX) 0.4 MG CAPS capsule  Daily        01/28/24 2025              Fabienne Bruns 01/28/24 2025    Ernie Avena, MD 01/29/24 0120

## 2024-04-03 ENCOUNTER — Other Ambulatory Visit (HOSPITAL_COMMUNITY): Payer: Self-pay

## 2024-04-06 ENCOUNTER — Other Ambulatory Visit (HOSPITAL_COMMUNITY): Payer: Self-pay

## 2024-04-07 DIAGNOSIS — I959 Hypotension, unspecified: Secondary | ICD-10-CM | POA: Diagnosis not present

## 2024-04-07 DIAGNOSIS — S32020A Wedge compression fracture of second lumbar vertebra, initial encounter for closed fracture: Secondary | ICD-10-CM | POA: Diagnosis not present

## 2024-04-07 DIAGNOSIS — S32000A Wedge compression fracture of unspecified lumbar vertebra, initial encounter for closed fracture: Secondary | ICD-10-CM | POA: Diagnosis not present

## 2024-04-07 DIAGNOSIS — S40022A Contusion of left upper arm, initial encounter: Secondary | ICD-10-CM | POA: Diagnosis not present

## 2024-04-07 DIAGNOSIS — S32038A Other fracture of third lumbar vertebra, initial encounter for closed fracture: Secondary | ICD-10-CM | POA: Diagnosis not present

## 2024-04-07 DIAGNOSIS — S32030A Wedge compression fracture of third lumbar vertebra, initial encounter for closed fracture: Secondary | ICD-10-CM | POA: Diagnosis not present

## 2024-04-07 DIAGNOSIS — S8991XA Unspecified injury of right lower leg, initial encounter: Secondary | ICD-10-CM | POA: Diagnosis not present

## 2024-04-07 DIAGNOSIS — M545 Low back pain, unspecified: Secondary | ICD-10-CM | POA: Diagnosis not present

## 2024-04-07 DIAGNOSIS — M160 Bilateral primary osteoarthritis of hip: Secondary | ICD-10-CM | POA: Diagnosis not present

## 2024-04-07 DIAGNOSIS — S61411A Laceration without foreign body of right hand, initial encounter: Secondary | ICD-10-CM | POA: Diagnosis not present

## 2024-04-07 DIAGNOSIS — M25561 Pain in right knee: Secondary | ICD-10-CM | POA: Diagnosis not present

## 2024-04-07 DIAGNOSIS — S3992XA Unspecified injury of lower back, initial encounter: Secondary | ICD-10-CM | POA: Diagnosis not present

## 2024-04-07 DIAGNOSIS — S32028A Other fracture of second lumbar vertebra, initial encounter for closed fracture: Secondary | ICD-10-CM | POA: Diagnosis not present

## 2024-04-07 DIAGNOSIS — S61412A Laceration without foreign body of left hand, initial encounter: Secondary | ICD-10-CM | POA: Diagnosis not present

## 2024-04-07 DIAGNOSIS — M7989 Other specified soft tissue disorders: Secondary | ICD-10-CM | POA: Diagnosis not present

## 2024-04-07 DIAGNOSIS — M5126 Other intervertebral disc displacement, lumbar region: Secondary | ICD-10-CM | POA: Diagnosis not present

## 2024-04-07 DIAGNOSIS — S32018A Other fracture of first lumbar vertebra, initial encounter for closed fracture: Secondary | ICD-10-CM | POA: Diagnosis not present

## 2024-04-07 DIAGNOSIS — S32010A Wedge compression fracture of first lumbar vertebra, initial encounter for closed fracture: Secondary | ICD-10-CM | POA: Diagnosis not present

## 2024-04-07 DIAGNOSIS — S40021A Contusion of right upper arm, initial encounter: Secondary | ICD-10-CM | POA: Diagnosis not present

## 2024-04-07 DIAGNOSIS — R58 Hemorrhage, not elsewhere classified: Secondary | ICD-10-CM | POA: Diagnosis not present

## 2024-04-07 DIAGNOSIS — S8001XA Contusion of right knee, initial encounter: Secondary | ICD-10-CM | POA: Diagnosis not present

## 2024-04-07 DIAGNOSIS — S51011A Laceration without foreign body of right elbow, initial encounter: Secondary | ICD-10-CM | POA: Diagnosis not present

## 2024-04-08 ENCOUNTER — Other Ambulatory Visit (HOSPITAL_COMMUNITY): Payer: Self-pay

## 2024-04-08 ENCOUNTER — Other Ambulatory Visit: Payer: Self-pay

## 2024-04-08 MED ORDER — CLOPIDOGREL BISULFATE 75 MG PO TABS
75.0000 mg | ORAL_TABLET | Freq: Every day | ORAL | 0 refills | Status: DC
  Filled 2024-04-08: qty 90, 90d supply, fill #0

## 2024-04-08 MED ORDER — CITALOPRAM HYDROBROMIDE 20 MG PO TABS
20.0000 mg | ORAL_TABLET | Freq: Every day | ORAL | 0 refills | Status: DC
  Filled 2024-04-08: qty 90, 90d supply, fill #0

## 2024-04-08 MED ORDER — PANTOPRAZOLE SODIUM 40 MG PO TBEC
40.0000 mg | DELAYED_RELEASE_TABLET | Freq: Every day | ORAL | 0 refills | Status: DC
  Filled 2024-04-08: qty 90, 90d supply, fill #0

## 2024-04-08 MED ORDER — ATORVASTATIN CALCIUM 10 MG PO TABS
10.0000 mg | ORAL_TABLET | Freq: Every day | ORAL | 0 refills | Status: DC
  Filled 2024-04-08: qty 90, 90d supply, fill #0

## 2024-04-08 MED ORDER — RAMIPRIL 10 MG PO CAPS
10.0000 mg | ORAL_CAPSULE | Freq: Every day | ORAL | 0 refills | Status: DC
  Filled 2024-04-08: qty 90, 90d supply, fill #0

## 2024-04-16 ENCOUNTER — Other Ambulatory Visit: Payer: Self-pay

## 2024-04-16 ENCOUNTER — Other Ambulatory Visit (HOSPITAL_COMMUNITY): Payer: Self-pay

## 2024-04-16 DIAGNOSIS — S32009A Unspecified fracture of unspecified lumbar vertebra, initial encounter for closed fracture: Secondary | ICD-10-CM | POA: Diagnosis not present

## 2024-04-16 DIAGNOSIS — F411 Generalized anxiety disorder: Secondary | ICD-10-CM | POA: Diagnosis not present

## 2024-04-16 DIAGNOSIS — I10 Essential (primary) hypertension: Secondary | ICD-10-CM | POA: Diagnosis not present

## 2024-04-16 DIAGNOSIS — Z09 Encounter for follow-up examination after completed treatment for conditions other than malignant neoplasm: Secondary | ICD-10-CM | POA: Diagnosis not present

## 2024-04-16 DIAGNOSIS — D72829 Elevated white blood cell count, unspecified: Secondary | ICD-10-CM | POA: Diagnosis not present

## 2024-04-16 MED ORDER — METHOCARBAMOL 500 MG PO TABS
750.0000 mg | ORAL_TABLET | ORAL | 0 refills | Status: AC
  Filled 2024-04-16 – 2024-04-20 (×3): qty 90, 10d supply, fill #0

## 2024-04-16 MED ORDER — GABAPENTIN 100 MG PO CAPS
100.0000 mg | ORAL_CAPSULE | Freq: Three times a day (TID) | ORAL | 0 refills | Status: AC
Start: 2024-04-16 — End: ?
  Filled 2024-04-16 (×2): qty 30, 10d supply, fill #0

## 2024-04-16 MED ORDER — BUSPIRONE HCL 10 MG PO TABS
10.0000 mg | ORAL_TABLET | Freq: Every day | ORAL | 0 refills | Status: DC
  Filled 2024-04-16 (×2): qty 30, 30d supply, fill #0

## 2024-04-17 ENCOUNTER — Other Ambulatory Visit: Payer: Self-pay

## 2024-04-17 ENCOUNTER — Other Ambulatory Visit (HOSPITAL_COMMUNITY): Payer: Self-pay

## 2024-04-20 ENCOUNTER — Other Ambulatory Visit (HOSPITAL_COMMUNITY): Payer: Self-pay

## 2024-05-05 ENCOUNTER — Other Ambulatory Visit: Payer: Self-pay

## 2024-05-05 ENCOUNTER — Other Ambulatory Visit (HOSPITAL_COMMUNITY): Payer: Self-pay

## 2024-05-05 DIAGNOSIS — F419 Anxiety disorder, unspecified: Secondary | ICD-10-CM | POA: Diagnosis not present

## 2024-05-05 DIAGNOSIS — M6208 Separation of muscle (nontraumatic), other site: Secondary | ICD-10-CM | POA: Diagnosis not present

## 2024-05-05 DIAGNOSIS — I10 Essential (primary) hypertension: Secondary | ICD-10-CM | POA: Diagnosis not present

## 2024-05-05 DIAGNOSIS — I6930 Unspecified sequelae of cerebral infarction: Secondary | ICD-10-CM | POA: Diagnosis not present

## 2024-05-05 DIAGNOSIS — R7303 Prediabetes: Secondary | ICD-10-CM | POA: Diagnosis not present

## 2024-05-05 DIAGNOSIS — K219 Gastro-esophageal reflux disease without esophagitis: Secondary | ICD-10-CM | POA: Diagnosis not present

## 2024-05-05 DIAGNOSIS — I6523 Occlusion and stenosis of bilateral carotid arteries: Secondary | ICD-10-CM | POA: Diagnosis not present

## 2024-05-05 DIAGNOSIS — F324 Major depressive disorder, single episode, in partial remission: Secondary | ICD-10-CM | POA: Diagnosis not present

## 2024-05-05 DIAGNOSIS — Z0000 Encounter for general adult medical examination without abnormal findings: Secondary | ICD-10-CM | POA: Diagnosis not present

## 2024-05-05 DIAGNOSIS — E7800 Pure hypercholesterolemia, unspecified: Secondary | ICD-10-CM | POA: Diagnosis not present

## 2024-05-05 DIAGNOSIS — D72829 Elevated white blood cell count, unspecified: Secondary | ICD-10-CM | POA: Diagnosis not present

## 2024-05-05 MED ORDER — RAMIPRIL 10 MG PO CAPS
10.0000 mg | ORAL_CAPSULE | Freq: Every day | ORAL | 1 refills | Status: DC
  Filled 2024-07-01: qty 90, 90d supply, fill #0
  Filled 2024-09-29: qty 90, 90d supply, fill #1

## 2024-05-05 MED ORDER — CLOPIDOGREL BISULFATE 75 MG PO TABS
75.0000 mg | ORAL_TABLET | Freq: Every day | ORAL | 1 refills | Status: DC
  Filled 2024-08-12: qty 90, 90d supply, fill #0
  Filled 2024-11-06: qty 90, 90d supply, fill #1

## 2024-05-05 MED ORDER — PANTOPRAZOLE SODIUM 40 MG PO TBEC
40.0000 mg | DELAYED_RELEASE_TABLET | Freq: Every morning | ORAL | 1 refills | Status: DC
  Filled 2024-05-06 – 2024-08-13 (×14): qty 90, 90d supply, fill #0
  Filled 2024-11-06: qty 90, 90d supply, fill #1

## 2024-05-05 MED ORDER — BUSPIRONE HCL 10 MG PO TABS
10.0000 mg | ORAL_TABLET | Freq: Every day | ORAL | 1 refills | Status: AC
  Filled 2024-05-05 – 2024-05-09 (×2): qty 90, 90d supply, fill #0
  Filled 2024-08-07: qty 90, 90d supply, fill #1

## 2024-05-05 MED ORDER — ATORVASTATIN CALCIUM 10 MG PO TABS
10.0000 mg | ORAL_TABLET | Freq: Every day | ORAL | 1 refills | Status: DC
  Filled 2024-07-01: qty 90, 90d supply, fill #0
  Filled 2024-09-29: qty 90, 90d supply, fill #1

## 2024-05-05 MED ORDER — CITALOPRAM HYDROBROMIDE 20 MG PO TABS
20.0000 mg | ORAL_TABLET | Freq: Every day | ORAL | 1 refills | Status: DC
  Filled 2024-07-01: qty 90, 90d supply, fill #0
  Filled 2024-09-29: qty 90, 90d supply, fill #1

## 2024-05-06 ENCOUNTER — Other Ambulatory Visit (HOSPITAL_COMMUNITY): Payer: Self-pay

## 2024-05-07 ENCOUNTER — Other Ambulatory Visit (HOSPITAL_COMMUNITY): Payer: Self-pay

## 2024-05-08 ENCOUNTER — Other Ambulatory Visit: Payer: Self-pay

## 2024-05-08 ENCOUNTER — Other Ambulatory Visit (HOSPITAL_COMMUNITY): Payer: Self-pay

## 2024-05-09 ENCOUNTER — Other Ambulatory Visit (HOSPITAL_COMMUNITY): Payer: Self-pay

## 2024-05-09 ENCOUNTER — Other Ambulatory Visit (HOSPITAL_BASED_OUTPATIENT_CLINIC_OR_DEPARTMENT_OTHER): Payer: Self-pay

## 2024-05-11 ENCOUNTER — Other Ambulatory Visit: Payer: Self-pay

## 2024-05-12 ENCOUNTER — Other Ambulatory Visit: Payer: Self-pay

## 2024-05-13 ENCOUNTER — Other Ambulatory Visit (HOSPITAL_COMMUNITY): Payer: Self-pay

## 2024-05-14 ENCOUNTER — Other Ambulatory Visit (HOSPITAL_COMMUNITY): Payer: Self-pay

## 2024-05-15 ENCOUNTER — Other Ambulatory Visit (HOSPITAL_COMMUNITY): Payer: Self-pay

## 2024-05-16 ENCOUNTER — Other Ambulatory Visit (HOSPITAL_COMMUNITY): Payer: Self-pay

## 2024-05-18 ENCOUNTER — Other Ambulatory Visit: Payer: Self-pay

## 2024-05-18 ENCOUNTER — Other Ambulatory Visit (HOSPITAL_COMMUNITY): Payer: Self-pay

## 2024-05-19 ENCOUNTER — Other Ambulatory Visit: Payer: Self-pay

## 2024-05-19 ENCOUNTER — Other Ambulatory Visit (HOSPITAL_COMMUNITY): Payer: Self-pay

## 2024-05-20 ENCOUNTER — Other Ambulatory Visit: Payer: Self-pay

## 2024-06-19 ENCOUNTER — Encounter: Payer: Self-pay | Admitting: Advanced Practice Midwife

## 2024-07-01 ENCOUNTER — Other Ambulatory Visit: Payer: Self-pay

## 2024-07-01 ENCOUNTER — Other Ambulatory Visit (HOSPITAL_COMMUNITY): Payer: Self-pay

## 2024-08-07 ENCOUNTER — Other Ambulatory Visit: Payer: Self-pay

## 2024-08-07 ENCOUNTER — Encounter: Payer: Self-pay | Admitting: Pharmacist

## 2024-08-12 ENCOUNTER — Other Ambulatory Visit: Payer: Self-pay

## 2024-08-12 ENCOUNTER — Other Ambulatory Visit (HOSPITAL_COMMUNITY): Payer: Self-pay

## 2024-08-13 ENCOUNTER — Other Ambulatory Visit (HOSPITAL_COMMUNITY): Payer: Self-pay

## 2024-08-13 ENCOUNTER — Other Ambulatory Visit: Payer: Self-pay

## 2024-08-14 ENCOUNTER — Other Ambulatory Visit (HOSPITAL_COMMUNITY): Payer: Self-pay

## 2024-08-14 ENCOUNTER — Other Ambulatory Visit: Payer: Self-pay

## 2024-09-16 ENCOUNTER — Ambulatory Visit (INDEPENDENT_AMBULATORY_CARE_PROVIDER_SITE_OTHER): Admitting: Neurology

## 2024-09-16 ENCOUNTER — Encounter: Payer: Self-pay | Admitting: Neurology

## 2024-09-16 VITALS — BP 150/84 | HR 63 | Ht 65.0 in | Wt 170.4 lb

## 2024-09-16 DIAGNOSIS — Z8673 Personal history of transient ischemic attack (TIA), and cerebral infarction without residual deficits: Secondary | ICD-10-CM

## 2024-09-16 DIAGNOSIS — R269 Unspecified abnormalities of gait and mobility: Secondary | ICD-10-CM

## 2024-09-16 DIAGNOSIS — T82898S Other specified complication of vascular prosthetic devices, implants and grafts, sequela: Secondary | ICD-10-CM

## 2024-09-16 DIAGNOSIS — I69354 Hemiplegia and hemiparesis following cerebral infarction affecting left non-dominant side: Secondary | ICD-10-CM
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
## 2024-09-16 NOTE — Patient Instructions (Addendum)
 I had a long d/w patient and his wife about his remote stroke, new findings of concern on CT angiogram done at [facility] in May which unfortunately I am unable to comment upon since I do not have films or reports to review today, risk for recurrent stroke/TIAs, personally independently reviewed imaging studies and stroke evaluation results and answered questions.Continue clopidogrel  75 mg daily  for secondary stroke prevention and maintain strict control of hypertension with blood pressure goal below 130/90, diabetes with hemoglobin A1c goal below 6.5% and lipids with LDL cholesterol goal below 70 mg/dL. I also advised the patient to eat a healthy diet with plenty of whole grains, cereals, fruits and vegetables, exercise regularly and maintain ideal body weight .patient's wife will drop off the results from [place] [facility] for me to review and comment upon.  Return for follow-up in the future only as necessary.

## 2024-11-06 ENCOUNTER — Other Ambulatory Visit: Payer: Self-pay

## 2024-11-06 ENCOUNTER — Other Ambulatory Visit (HOSPITAL_COMMUNITY): Payer: Self-pay

## 2024-11-06 ENCOUNTER — Encounter: Payer: Self-pay | Admitting: Physician Assistant

## 2024-11-06 DIAGNOSIS — G4730 Sleep apnea, unspecified: Secondary | ICD-10-CM | POA: Diagnosis not present

## 2024-11-06 DIAGNOSIS — E7800 Pure hypercholesterolemia, unspecified: Secondary | ICD-10-CM | POA: Diagnosis not present

## 2024-11-06 DIAGNOSIS — K219 Gastro-esophageal reflux disease without esophagitis: Secondary | ICD-10-CM | POA: Diagnosis not present

## 2024-11-06 DIAGNOSIS — I6930 Unspecified sequelae of cerebral infarction: Secondary | ICD-10-CM | POA: Diagnosis not present

## 2024-11-06 DIAGNOSIS — R7303 Prediabetes: Secondary | ICD-10-CM | POA: Diagnosis not present

## 2024-11-06 DIAGNOSIS — F324 Major depressive disorder, single episode, in partial remission: Secondary | ICD-10-CM | POA: Diagnosis not present

## 2024-11-06 DIAGNOSIS — Z23 Encounter for immunization: Secondary | ICD-10-CM | POA: Diagnosis not present

## 2024-11-06 DIAGNOSIS — F419 Anxiety disorder, unspecified: Secondary | ICD-10-CM | POA: Diagnosis not present

## 2024-11-06 DIAGNOSIS — I10 Essential (primary) hypertension: Secondary | ICD-10-CM | POA: Diagnosis not present

## 2024-11-06 MED ORDER — CLOPIDOGREL BISULFATE 75 MG PO TABS
75.0000 mg | ORAL_TABLET | Freq: Every day | ORAL | 1 refills | Status: AC
  Filled 2024-11-06: qty 90, 90d supply, fill #0

## 2024-11-06 MED ORDER — CITALOPRAM HYDROBROMIDE 20 MG PO TABS
20.0000 mg | ORAL_TABLET | Freq: Every day | ORAL | 1 refills | Status: AC
  Filled 2024-11-06 – 2024-12-28 (×2): qty 90, 90d supply, fill #0

## 2024-11-06 MED ORDER — ATORVASTATIN CALCIUM 10 MG PO TABS
10.0000 mg | ORAL_TABLET | Freq: Every day | ORAL | 1 refills | Status: AC
  Filled 2024-11-06 – 2024-12-28 (×2): qty 90, 90d supply, fill #0

## 2024-11-06 MED ORDER — PANTOPRAZOLE SODIUM 40 MG PO TBEC
40.0000 mg | DELAYED_RELEASE_TABLET | Freq: Every day | ORAL | 1 refills | Status: AC
  Filled 2024-11-06 – 2024-11-07 (×2): qty 90, 90d supply, fill #0

## 2024-11-06 MED ORDER — RAMIPRIL 10 MG PO CAPS
10.0000 mg | ORAL_CAPSULE | Freq: Every day | ORAL | 1 refills | Status: AC
  Filled 2024-11-06 – 2024-12-28 (×2): qty 90, 90d supply, fill #0

## 2024-11-07 ENCOUNTER — Other Ambulatory Visit (HOSPITAL_COMMUNITY): Payer: Self-pay

## 2024-12-17 NOTE — Progress Notes (Unsigned)
 "     Ellouise Console, PA-C [address] [place], [place]  [address] Phone: [phone]   Primary Care Physician: Seabron Lenis, MD  Primary Gastroenterologist:  Ellouise Console, PA-C / Glendia Holt, MD   Chief Complaint:  Repeat Colonoscopy; history of colon polyps      HPI:   Discussed the use of AI scribe software for clinical note transcription with the patient, who gave verbal consent to proceed.  LATREL SZYMCZAK is a 74 year old male with a history of precancerous colon polyps who presents for routine follow-up and scheduling of a repeat colonoscopy.  No current gastrointestinal symptoms, including abdominal pain, heartburn, dysphagia, diarrhea, constipation, or rectal bleeding.  He has history of hemorrhoids which are not currently causing any symptoms.  He has no concerns today.  07/2018 last colonoscopy by Dr. Teressa: 3 small (2 mm to 3 mm) polyps removed.  Pathology showed 2 tubular adenomas and 1 hyperplastic polyp.  Good prep.  5-year repeat colonoscopy was recommended.  PMH: CVA (2017), HTN, carotid artery stenosis, CKD, tobacco use, currently on Plavix .  Neurologist is Dr. Rosemarie.  Takes pantoprazole  40 mg daily for GERD.  Plavix  is prescribed by his PCP Dr. Lenis Seabron.  Of note, he had recent left shoulder injury.  Has follow-up with his orthopedic in the next few weeks to discuss scheduling surgery.  History of shoulder dislocation.  Current Outpatient Medications  Medication Sig Dispense Refill   albuterol  (VENTOLIN  HFA) 108 (90 Base) MCG/ACT inhaler Inhale 1-2 puffs into the lungs every 6 (six) hours as needed for wheezing or shortness of breath. 1 each 0   atorvastatin  (LIPITOR) 10 MG tablet Take 1 tablet (10 mg total) by mouth daily. 90 tablet 1   azithromycin  (ZITHROMAX ) 250 MG tablet Take 1 tablet (250 mg total) by mouth daily. Take first 2 tablets together, then 1 every day until finished. 6 tablet 0   busPIRone  (BUSPAR ) 10 MG tablet Take 1 tablet (10 mg  total) by mouth at bedtime. 90 tablet 1   citalopram  (CELEXA ) 20 MG tablet Take 1 tablet (20 mg total) by mouth daily. 90 tablet 1   clopidogrel  (PLAVIX ) 75 MG tablet Take 1 tablet (75 mg total) by mouth daily. 90 tablet 1   cyclobenzaprine  (FLEXERIL ) 5 MG tablet Take 1 tablet (5 mg total) by mouth at bedtime. 20 tablet 0   gabapentin  (NEURONTIN ) 100 MG capsule Take 1 capsule (100 mg total) by mouth every 8 (eight) hours for 10 days. 30 capsule 0   methocarbamol  (ROBAXIN ) 500 MG tablet Take 1.5 tablets (750 mg total) by mouth every 4 (four) hours for 10 days. 90 tablet 0   Na Sulfate-K Sulfate-Mg Sulfate concentrate (SUPREP) 17.5-3.13-1.6 GM/177ML SOLN Take 1 kit (354 mLs total) by mouth once for 1 dose. 354 mL 0   ondansetron  (ZOFRAN ) 4 MG tablet Take 1 tablet (4 mg total) by mouth every 6 (six) hours. 12 tablet 0   oxyCODONE  (ROXICODONE ) 5 MG immediate release tablet Take 1 tablet (5 mg total) by mouth every 4 (four) hours as needed for severe pain (pain score 7-10). 10 tablet 0   pantoprazole  (PROTONIX ) 40 MG tablet Take 1 tablet (40 mg total) by mouth 1/2 to 1 hour before morning meal. 90 tablet 1   ramipril  (ALTACE ) 10 MG capsule 1 capsule Orally Once a day for 90 days     ramipril  (ALTACE ) 10 MG capsule Take 1 capsule (10 mg total) by mouth daily. 90 capsule 1  ramipril  (ALTACE ) 5 MG capsule Take 1 capsule (5 mg total) by mouth daily. 90 capsule 1   tamsulosin  (FLOMAX ) 0.4 MG CAPS capsule Take 1 capsule (0.4 mg total) by mouth daily. This should be taken until you pass the stone. 15 capsule 0   Current Facility-Administered Medications  Medication Dose Route Frequency Provider Last Rate Last Admin   0.9 %  sodium chloride  infusion  500 mL Intravenous Once Teressa Toribio SQUIBB, MD        Allergies as of 12/18/2024   (No Known Allergies)    Past Medical History:  Diagnosis Date   Depression    GERD (gastroesophageal reflux disease)    HTN (hypertension)    Hyperlipidemia    Insomnia     Stroke [facility])     Past Surgical History:  Procedure Laterality Date   IR GENERIC HISTORICAL  09/17/2016   IR RADIOLOGIST EVAL & MGMT 09/17/2016 [facility]-INTERV RAD   knee arthoscopy     LEG SURGERY     RADIOLOGY WITH ANESTHESIA N/A 05/12/2016   Procedure: RADIOLOGY WITH ANESTHESIA;  Surgeon: Thyra Nash, MD;  Location: [facility] OR;  Service: Radiology;  Laterality: N/A;    Review of Systems:    All systems reviewed and negative except where noted in HPI.    Physical Exam:  BP 110/70   Pulse 82   Ht 5' 5 (1.651 m)   Wt 170 lb 4 oz (77.2 kg)   SpO2 98%   BMI 28.33 kg/m  No LMP for male patient.  General: Well-nourished, well-developed in no acute distress.  He is able to get on and off exam table with no help.  No assistive walking devices. Lungs: Clear to auscultation bilaterally. Non-labored. Heart: Regular rate and rhythm, no murmurs rubs or gallops.  Abdomen: Bowel sounds are normal; Abdomen is Soft; No hepatosplenomegaly, masses or hernias;  No Abdominal Tenderness; No guarding or rebound tenderness. Neuro: Alert and oriented x 3.  Grossly intact.  He walks normally with no assistive devices. Psych: Alert and cooperative, normal mood and affect.   Imaging Studies: No results found.  Labs: CBC    Component Value Date/Time   WBC 13.3 (H) 01/28/2024 1543   RBC 4.62 01/28/2024 1543   HGB 15.4 01/28/2024 1543   HCT 45.6 01/28/2024 1543   PLT 253 01/28/2024 1543   MCV 98.7 01/28/2024 1543   MCH 33.3 01/28/2024 1543   MCHC 33.8 01/28/2024 1543   RDW 13.4 01/28/2024 1543   LYMPHSABS 1.6 05/17/2016 0539   MONOABS 0.7 05/17/2016 0539   EOSABS 0.2 05/17/2016 0539   BASOSABS 0.0 05/17/2016 0539    CMP     Component Value Date/Time   NA 140 01/28/2024 1543   K 4.0 01/28/2024 1543   CL 104 01/28/2024 1543   CO2 28 01/28/2024 1543   GLUCOSE 102 (H) 01/28/2024 1543   BUN 19 01/28/2024 1543   CREATININE 1.06 01/28/2024 1543   CALCIUM  8.8 (L) 01/28/2024 1543   PROT  7.0 01/28/2024 1543   ALBUMIN 4.3 01/28/2024 1543   AST 16 01/28/2024 1543   ALT 29 01/28/2024 1543   ALKPHOS 70 01/28/2024 1543   BILITOT 0.6 01/28/2024 1543   GFRNONAA >60 01/28/2024 1543   GFRAA >60 05/30/2016 1021     Assessment and Plan:   SOREN LAZARZ is a 74 y.o. y/o male presents for:  1.  History of adenomatous colon polyps - Scheduling repeat surveillance colonoscopy I discussed risks of colonoscopy with patient to include  risk of bleeding, colon perforation, and risk of sedation.  Patient expressed understanding and agrees to proceed with colonoscopy.   2.  Comorbidities:  CVA (2017), HTN, hemiparesis, carotid artery stenosis, CKD, tobacco use, currently on Plavix .  Neurologist is Dr. Rosemarie. - Requesting permission to hold Plavix  5 days prior to colonoscopy procedure.   Ellouise Console, PA-C  Follow up As Needed based on Colonoscopy results.   "

## 2024-12-18 ENCOUNTER — Ambulatory Visit (INDEPENDENT_AMBULATORY_CARE_PROVIDER_SITE_OTHER): Admitting: Physician Assistant

## 2024-12-18 ENCOUNTER — Other Ambulatory Visit: Payer: Self-pay

## 2024-12-18 ENCOUNTER — Other Ambulatory Visit (HOSPITAL_COMMUNITY): Payer: Self-pay

## 2024-12-18 ENCOUNTER — Telehealth: Payer: Self-pay

## 2024-12-18 ENCOUNTER — Encounter: Payer: Self-pay | Admitting: Physician Assistant

## 2024-12-18 VITALS — BP 110/70 | HR 82 | Ht 65.0 in | Wt 170.2 lb

## 2024-12-18 DIAGNOSIS — Z09 Encounter for follow-up examination after completed treatment for conditions other than malignant neoplasm: Secondary | ICD-10-CM | POA: Diagnosis not present

## 2024-12-18 DIAGNOSIS — Z8673 Personal history of transient ischemic attack (TIA), and cerebral infarction without residual deficits: Secondary | ICD-10-CM

## 2024-12-18 DIAGNOSIS — Z860101 Personal history of adenomatous and serrated colon polyps: Secondary | ICD-10-CM

## 2024-12-18 DIAGNOSIS — Z860100 Personal history of colon polyps, unspecified: Secondary | ICD-10-CM

## 2024-12-18 DIAGNOSIS — K649 Unspecified hemorrhoids: Secondary | ICD-10-CM

## 2024-12-18 MED ORDER — NA SULFATE-K SULFATE-MG SULF 17.5-3.13-1.6 GM/177ML PO SOLN
1.0000 | Freq: Once | ORAL | 0 refills | Status: AC
  Filled 2024-12-18: qty 354, 1d supply, fill #0

## 2024-12-18 NOTE — Telephone Encounter (Signed)
 Plavix  clearance faxed to Dr Seabron

## 2024-12-18 NOTE — Patient Instructions (Addendum)
 You have been scheduled for a colonoscopy. Please follow written instructions given to you at your visit today.   If you use inhalers (even only as needed), please bring them with you on the day of your procedure.  DO NOT TAKE 7 DAYS PRIOR TO TEST- Trulicity (dulaglutide) Ozempic, Wegovy (semaglutide) Mounjaro, Zepbound (tirzepatide) Bydureon Bcise (exanatide extended release)  DO NOT TAKE 1 DAY PRIOR TO YOUR TEST Rybelsus (semaglutide) Adlyxin (lixisenatide) Victoza (liraglutide) Byetta (exanatide) ___________________________________________________________________________  Johnathan [name] will be contacted by our office prior to your procedure for directions on holding your Plavix .  If you do not hear from our office 1 week prior to your scheduled procedure, please call [phone] to discuss.

## 2024-12-22 NOTE — Telephone Encounter (Signed)
 Nat from Dr Kathryn office called and states Dr Seabron has approved the Plavix  hold for pts upcoming procedure. Will notify pt as soon as letter confirmation is received from provider.

## 2024-12-23 NOTE — Progress Notes (Signed)
 Agree with the assessment and plan as outlined by Brigitte Canard, PA-C.

## 2024-12-28 ENCOUNTER — Other Ambulatory Visit: Payer: Self-pay

## 2024-12-28 ENCOUNTER — Other Ambulatory Visit (HOSPITAL_COMMUNITY): Payer: Self-pay

## 2024-12-29 NOTE — Telephone Encounter (Signed)
 Pts wife, Nena (on HIPAA), informed he is to hold Plavix  5 days prior to procedure and states understanding.

## 2024-12-30 ENCOUNTER — Ambulatory Visit: Admitting: Orthopedic Surgery

## 2025-01-13 ENCOUNTER — Encounter: Admitting: Gastroenterology

## 2025-01-20 ENCOUNTER — Ambulatory Visit: Admitting: Orthopedic Surgery
